# Patient Record
Sex: Female | Born: 1980
Health system: Southern US, Community
[De-identification: ages and names within clinical notes are randomized; demographics above are authoritative.]

## PROBLEM LIST (undated history)

## (undated) DIAGNOSIS — IMO0001 Reserved for inherently not codable concepts without codable children: Secondary | ICD-10-CM

## (undated) DIAGNOSIS — F32A Depression, unspecified: Secondary | ICD-10-CM

## (undated) DIAGNOSIS — K219 Gastro-esophageal reflux disease without esophagitis: Secondary | ICD-10-CM

## (undated) DIAGNOSIS — B019 Varicella without complication: Secondary | ICD-10-CM

## (undated) DIAGNOSIS — O24419 Gestational diabetes mellitus in pregnancy, unspecified control: Secondary | ICD-10-CM

## (undated) DIAGNOSIS — F329 Major depressive disorder, single episode, unspecified: Secondary | ICD-10-CM

## (undated) HISTORY — DX: Reserved for inherently not codable concepts without codable children: IMO0001

## (undated) HISTORY — PX: NO PAST SURGERIES: SHX2092

## (undated) HISTORY — DX: Gastro-esophageal reflux disease without esophagitis: K21.9

## (undated) HISTORY — DX: Varicella without complication: B01.9

## (undated) HISTORY — DX: Major depressive disorder, single episode, unspecified: F32.9

## (undated) HISTORY — PX: LASIK: SHX215

## (undated) HISTORY — DX: Depression, unspecified: F32.A

---

## 2011-03-12 LAB — HM PAP SMEAR

## 2011-04-29 ENCOUNTER — Inpatient Hospital Stay (INDEPENDENT_AMBULATORY_CARE_PROVIDER_SITE_OTHER)
Admission: RE | Admit: 2011-04-29 | Discharge: 2011-04-29 | Disposition: A | Discharge status: Home / Self Care | Payer: Self-pay | Source: Ambulatory Visit | Attending: Emergency Medicine | Admitting: Emergency Medicine

## 2011-04-29 DIAGNOSIS — J019 Acute sinusitis, unspecified: Secondary | ICD-10-CM

## 2011-04-29 DIAGNOSIS — J069 Acute upper respiratory infection, unspecified: Secondary | ICD-10-CM

## 2011-04-29 DIAGNOSIS — H6590 Unspecified nonsuppurative otitis media, unspecified ear: Secondary | ICD-10-CM

## 2011-05-10 ENCOUNTER — Ambulatory Visit: Payer: Self-pay | Admitting: Internal Medicine

## 2011-07-12 ENCOUNTER — Ambulatory Visit (INDEPENDENT_AMBULATORY_CARE_PROVIDER_SITE_OTHER)
Admission: RE | Admit: 2011-07-12 | Discharge: 2011-07-12 | Disposition: A | Discharge status: Home / Self Care | Payer: 59 | Source: Ambulatory Visit | Attending: Internal Medicine | Admitting: Internal Medicine

## 2011-07-12 ENCOUNTER — Encounter: Payer: Self-pay | Admitting: Internal Medicine

## 2011-07-12 ENCOUNTER — Ambulatory Visit (INDEPENDENT_AMBULATORY_CARE_PROVIDER_SITE_OTHER): Payer: 59 | Admitting: Internal Medicine

## 2011-07-12 VITALS — BP 130/80 | HR 72 | Temp 98.1°F | Resp 14 | Ht 64.0 in | Wt 194.0 lb

## 2011-07-12 DIAGNOSIS — M25571 Pain in right ankle and joints of right foot: Secondary | ICD-10-CM

## 2011-07-12 DIAGNOSIS — M25579 Pain in unspecified ankle and joints of unspecified foot: Secondary | ICD-10-CM

## 2011-07-12 DIAGNOSIS — E7800 Pure hypercholesterolemia, unspecified: Secondary | ICD-10-CM

## 2011-07-12 LAB — LIPID PANEL: Cholesterol: 207 mg/dL — ABNORMAL HIGH (ref 0–200)

## 2011-07-12 NOTE — Progress Notes (Signed)
Subjective:    Patient ID: Jasmine Ortega, female    DOB: 1981-05-05, 31 y.o.   MRN: [id number]  HPI  New to establish Hx of mild depression Increased stress CAD in paternal grandfather at age 62  Review of Systems  Constitutional: Negative for activity change, appetite change and fatigue.  HENT: Negative for ear pain, congestion, neck pain, postnasal drip and sinus pressure.   Eyes: Negative for redness and visual disturbance.  Respiratory: Negative for cough, shortness of breath and wheezing.   Gastrointestinal: Negative for abdominal pain and abdominal distention.  Genitourinary: Negative for dysuria, frequency and menstrual problem.  Musculoskeletal: Negative for myalgias, joint swelling and arthralgias.  Skin: Negative for rash and wound.  Neurological: Negative for dizziness, weakness and headaches.  Hematological: Negative for adenopathy. Does not bruise/bleed easily.  Psychiatric/Behavioral: Negative for sleep disturbance and decreased concentration.   Past Medical History  Diagnosis Date  . Depression   . Chicken pox   . GERD (gastroesophageal reflux disease)   . Birth control     History   Social History  . Marital Status: Single    Spouse Name: N/A    Number of Children: N/A  . Years of Education: N/A   Occupational History  . Not on file.   Social History Main Topics  . Smoking status: Never Smoker   . Smokeless tobacco: Not on file  . Alcohol Use: Yes  . Drug Use: No  . Sexually Active: Not on file   Other Topics Concern  . Not on file   Social History Narrative  . No narrative on file    No past surgical history on file.  Family History  Problem Relation Age of Onset  . Arthritis Father   . Hyperlipidemia Father   . Hypertension Father   . Arthritis Paternal Grandmother   . Heart disease Paternal Grandmother   . Breast cancer Mother   . Miscarriages / Stillbirths Maternal Grandfather   . Diabetes Paternal Grandfather     No Known  Allergies  No current outpatient prescriptions on file prior to visit.    BP 130/80  Pulse 72  Temp 98.1 F (36.7 C)  Resp 14  Ht 5\' 4"  (1.626 m)  Wt 194 lb (87.998 kg)  BMI 33.30 kg/m2       Objective:   Physical Exam  Nursing note and vitals reviewed. Constitutional: She is oriented to person, place, and time. She appears well-developed and well-nourished. No distress.  HENT:  Head: Normocephalic and atraumatic.  Right Ear: External ear normal.  Left Ear: External ear normal.  Nose: Nose normal.  Mouth/Throat: Oropharynx is clear and moist.  Eyes: Conjunctivae and EOM are normal. Pupils are equal, round, and reactive to light.  Neck: Normal range of motion. Neck supple. No JVD present. No tracheal deviation present. No thyromegaly present.  Cardiovascular: Normal rate, regular rhythm, normal heart sounds and intact distal pulses.   No murmur heard. Pulmonary/Chest: Effort normal and breath sounds normal. She has no wheezes. She exhibits no tenderness.  Abdominal: Soft. Bowel sounds are normal.  Musculoskeletal: Normal range of motion. She exhibits no edema and no tenderness.  Lymphadenopathy:    She has no cervical adenopathy.  Neurological: She is alert and oriented to person, place, and time. She has normal reflexes. No cranial nerve deficit.  Skin: Skin is warm and dry. She is not diaphoretic.  Psychiatric: She has a normal mood and affect. Her behavior is normal.    GYN is [employer]/textile [employer]ologist  Berneice Gandy at [employer] Has not had screening lipids       Assessment & Plan:   6 chronic right foot pain possibly a subacute right foot fracture her pulse and for x-rays and refer to foot orthopedist.  History of depression currently without symptoms of acute or chronic depression continue to monitor.  History of cardiovascular disease in her family monitor screening lipids today continue with her woman's wellness examinations on a yearly basis again internal medicine wellness  examinations at age 82

## 2011-07-12 NOTE — Patient Instructions (Signed)
The patient is instructed to continue all medications as prescribed. Schedule followup with check out clerk upon leaving the clinic  

## 2012-04-04 ENCOUNTER — Ambulatory Visit (INDEPENDENT_AMBULATORY_CARE_PROVIDER_SITE_OTHER): Payer: 59 | Admitting: Family

## 2012-04-04 ENCOUNTER — Encounter: Payer: Self-pay | Admitting: Family

## 2012-04-04 VITALS — BP 120/80 | Temp 98.3°F | Wt 183.0 lb

## 2012-04-04 DIAGNOSIS — L919 Hypertrophic disorder of the skin, unspecified: Secondary | ICD-10-CM

## 2012-04-04 DIAGNOSIS — B351 Tinea unguium: Secondary | ICD-10-CM

## 2012-04-04 DIAGNOSIS — L918 Other hypertrophic disorders of the skin: Secondary | ICD-10-CM

## 2012-04-04 LAB — BASIC METABOLIC PANEL
BUN: 15 mg/dL (ref 6–23)
Chloride: 104 mEq/L (ref 96–112)
Glucose, Bld: 85 mg/dL (ref 70–99)
Potassium: 4 mEq/L (ref 3.5–5.1)

## 2012-04-04 MED ORDER — CICLOPIROX 8 % EX SOLN: Freq: Every day | CUTANEOUS | Status: DC

## 2012-04-04 NOTE — Progress Notes (Signed)
Subjective:    Patient ID: Jasmine Ortega, female    DOB: 23-Aug-1980, 31 y.o.   MRN: [id number]  HPI 31 year old white female, nonsmoker, patient of Dr. Fabian Sharp is in today with complaints of a nail fungus to her left great toe has been present several years. She has tried several over-the-counter antifungal agents, Vicks vapor of, no polishes that had not been effective against treating her no fungus. She does have tenderness to touch of the nail. It is lifting from the nailbed.   Patient also has a skin tag and she will like to have removed under her left breast. It's become red and inflamed due to the location.     Review of Systems  Constitutional: Negative.   Respiratory: Negative.   Cardiovascular: Negative.   Gastrointestinal: Negative.   Musculoskeletal: Negative.   Skin: Negative.        Red irritated, skin tag under the left breast.  Nail fungus to the right great toe.  Neurological: Negative.   Psychiatric/Behavioral: Negative.    Past Medical History  Diagnosis Date  . Depression   . Chicken pox   . GERD (gastroesophageal reflux disease)   . Birth control     History   Social History  . Marital Status: Single    Spouse Name: N/A    Number of Children: N/A  . Years of Education: N/A   Occupational History  . Not on file.   Social History Main Topics  . Smoking status: Never Smoker   . Smokeless tobacco: Not on file  . Alcohol Use: Yes  . Drug Use: No  . Sexually Active: Not on file   Other Topics Concern  . Not on file   Social History Narrative  . No narrative on file    No past surgical history on file.  Family History  Problem Relation Age of Onset  . Arthritis Father   . Hyperlipidemia Father   . Hypertension Father   . Arthritis Paternal Grandmother   . Heart disease Paternal Grandmother   . Breast cancer Mother   . Miscarriages / Stillbirths Maternal Grandfather   . Diabetes Paternal Grandfather     No Known Allergies  Current  Outpatient Prescriptions on File Prior to Visit  Medication Sig Dispense Refill  . Multiple Vitamin (MULTIVITAMIN) tablet Take 1 tablet by mouth daily.      . Norgestimate-Ethinyl Estradiol Triphasic (ORTHO TRI-CYCLEN LO) 0.18/0.215/0.25 MG-25 MCG tablet Take 1 tablet by mouth daily.        BP 120/80  Temp 98.3 F (36.8 C) (Oral)  Wt 183 lb (83.008 kg)chart    Objective:   Physical Exam  Constitutional: She is oriented to person, place, and time. She appears well-developed and well-nourished.  Neck: Normal range of motion. Neck supple.  Cardiovascular: Normal rate, regular rhythm and normal heart sounds.   Pulmonary/Chest: Effort normal and breath sounds normal.  Abdominal: Soft. Bowel sounds are normal.  Musculoskeletal: Normal range of motion.  Neurological: She is alert and oriented to person, place, and time.  Skin: Skin is warm and dry.       Small red, irritated skin tag noted to the trunk under the left breast. No s/s of infection.   Left great toenail 60% removed from the nailbed. Tender to palpation. Fungus noted under the nail.     Psychiatric: She has a normal mood and affect.   Procedure: Skin tag removal, informed consent was obtained. Under sterile technique, 1/2 cc of lidocaine with epi  was used to anesthetize the skin tag under the left breast. #11 blade used to remove the skin tag. Cauterized with nitro-stick. Patient tolerated the procedure well.        Assessment & Plan:  Assessment: Onychomycosis, Skin tag  Plan: Penlac solution to the toe every night x7 days. Removal of alcohol after 7 days reapply. BMP sent. Will notify patient pending results. Call the office if symptoms worsen or persist. Recheck a schedule, appearing.

## 2012-04-04 NOTE — Patient Instructions (Addendum)
Ringworm, Nail  A fungal infection of the nail (tinea unguium/onychomycosis) is common. It is common as the visible part of the nail is composed of dead cells which have no blood supply to help prevent infection. It occurs because fungi are everywhere and will pick any opportunity to grow on any dead material.  Because nails are very slow growing they require up to 2 years of treatment with anti-fungal medications. The entire nail back to the base is infected. This includes approximately  of the nail which you cannot see.  If your caregiver has prescribed a medication by mouth, take it every day and as directed. No progress will be seen for at least 6 to 9 months. Do not be disappointed! Because fungi live on dead cells with little or no exposure to blood supply, medication delivery to the infection is slow; thus the cure is slow. It is also why you can observe no progress in the first 6 months. The nail becoming cured is the base of the nail, as it has the blood supply. Topical medication such as creams and ointments are usually not effective. Important in successful treatment of nail fungus is closely following the medication regimen that your doctor prescribes.  Sometimes you and your caregiver may elect to speed up this process by surgical removal of all the nails. Even this may still require 6 to 9 months of additional oral medications.  See your caregiver as directed. Remember there will be no visible improvement for at least 6 months. See your caregiver sooner if other signs of infection (redness and swelling) develop.  Document Released: 06/11/2000 Document Revised: 09/06/2011 Document Reviewed: 08/20/2008  [employer] Patient Information 2013 [employer].

## 2012-08-18 ENCOUNTER — Telehealth: Payer: Self-pay | Admitting: Family

## 2012-08-18 MED ORDER — CICLOPIROX 8 % EX SOLN: Freq: Every day | CUTANEOUS | Status: DC

## 2012-08-18 NOTE — Telephone Encounter (Signed)
Pt would like a refill of ciclopirox (PENLAC) 8 % solution. Her toenail is still not well. [facility] [address] [address] [facility]

## 2013-12-12 ENCOUNTER — Ambulatory Visit (INDEPENDENT_AMBULATORY_CARE_PROVIDER_SITE_OTHER): Payer: 59 | Admitting: Family Medicine

## 2013-12-12 ENCOUNTER — Encounter: Payer: Self-pay | Admitting: Family Medicine

## 2013-12-12 VITALS — BP 100/72 | Ht 64.0 in | Wt 185.0 lb

## 2013-12-12 DIAGNOSIS — R6882 Decreased libido: Secondary | ICD-10-CM

## 2013-12-12 DIAGNOSIS — K9041 Non-celiac gluten sensitivity: Secondary | ICD-10-CM

## 2013-12-12 DIAGNOSIS — R51 Headache: Secondary | ICD-10-CM

## 2013-12-12 DIAGNOSIS — L818 Other specified disorders of pigmentation: Secondary | ICD-10-CM

## 2013-12-12 DIAGNOSIS — L819 Disorder of pigmentation, unspecified: Secondary | ICD-10-CM

## 2013-12-12 DIAGNOSIS — Z803 Family history of malignant neoplasm of breast: Secondary | ICD-10-CM

## 2013-12-12 DIAGNOSIS — K900 Celiac disease: Secondary | ICD-10-CM

## 2013-12-12 DIAGNOSIS — Z0000 Encounter for general adult medical examination without abnormal findings: Secondary | ICD-10-CM

## 2013-12-12 LAB — POCT URINALYSIS DIPSTICK
Bilirubin, UA: NEGATIVE
GLUCOSE UA: NEGATIVE
Ketones, UA: NEGATIVE
Leukocytes, UA: NEGATIVE
NITRITE UA: NEGATIVE
PROTEIN UA: NEGATIVE
RBC UA: NEGATIVE
Spec Grav, UA: 1.01
UROBILINOGEN UA: NEGATIVE
pH, UA: 7

## 2013-12-12 LAB — COMPREHENSIVE METABOLIC PANEL
ALK PHOS: 76 U/L (ref 39–117)
ALT: 19 U/L (ref 0–35)
AST: 15 U/L (ref 0–37)
Albumin: 4.2 g/dL (ref 3.5–5.2)
BILIRUBIN TOTAL: 0.7 mg/dL (ref 0.2–1.2)
BUN: 18 mg/dL (ref 6–23)
CO2: 23 mEq/L (ref 19–32)
CREATININE: 0.82 mg/dL (ref 0.50–1.10)
Calcium: 9.4 mg/dL (ref 8.4–10.5)
Chloride: 98 mEq/L (ref 96–112)
Glucose, Bld: 77 mg/dL (ref 70–99)
Potassium: 4.1 mEq/L (ref 3.5–5.3)
SODIUM: 134 meq/L — AB (ref 135–145)
TOTAL PROTEIN: 7.3 g/dL (ref 6.0–8.3)

## 2013-12-12 LAB — CBC WITH DIFFERENTIAL/PLATELET
BASOS ABS: 0 10*3/uL (ref 0.0–0.1)
BASOS PCT: 0 % (ref 0–1)
EOS ABS: 0.2 10*3/uL (ref 0.0–0.7)
EOS PCT: 2 % (ref 0–5)
HCT: 37.8 % (ref 36.0–46.0)
Hemoglobin: 13.2 g/dL (ref 12.0–15.0)
LYMPHS ABS: 3.2 10*3/uL (ref 0.7–4.0)
Lymphocytes Relative: 33 % (ref 12–46)
MCH: 29.5 pg (ref 26.0–34.0)
MCHC: 34.9 g/dL (ref 30.0–36.0)
MCV: 84.4 fL (ref 78.0–100.0)
Monocytes Absolute: 0.7 10*3/uL (ref 0.1–1.0)
Monocytes Relative: 7 % (ref 3–12)
NEUTROS PCT: 58 % (ref 43–77)
Neutro Abs: 5.7 10*3/uL (ref 1.7–7.7)
PLATELETS: 306 10*3/uL (ref 150–400)
RBC: 4.48 MIL/uL (ref 3.87–5.11)
RDW: 13.5 % (ref 11.5–15.5)
WBC: 9.8 10*3/uL (ref 4.0–10.5)

## 2013-12-12 LAB — LIPID PANEL
CHOL/HDL RATIO: 3.1 ratio
CHOLESTEROL: 233 mg/dL — AB (ref 0–200)
HDL: 76 mg/dL (ref >39)
LDL CALC: 129 mg/dL — AB (ref 0–99)
Triglycerides: 138 mg/dL (ref <150)
VLDL: 28 mg/dL (ref 0–40)

## 2013-12-12 NOTE — Progress Notes (Signed)
Subjective:    Patient ID: Jasmine Ortega, female    DOB: 1981/03/29, 33 y.o.   MRN: [id number]  HPI She is here for a complete examination. She has several concerns. She notes that does have a gluten intolerance. She has cut back on gluten and her bloating and abdominal distress have greatly diminished. She is not interested in further testing however. She also complains of decreased libido over the last several years and notes that this occurred roughly her on the same time that she was switched on her birth control pills. Was apparently due to a slight change in her menstrual cycle. She also notes intermittent headaches that occur usually monthly alert in the frontal area. Air constant and did cause some nausea but no photophobia or phonophobia. There is also a previous history consistent with ADD. Apparently she did have testing which did show this. She notes decreased focus as well as inability to finish her tasks. She seems to now be able to handle this fairly well at this time is not necessarily interested in medication. She also has concerns over her weight. Family history significant for mother dying at age 66 of breast cancer.   Review of Systems  All other systems reviewed and are negative.      Objective:   Physical Exam BP 100/72  Ht _0  (1.626 m)  Wt 185 lb (83.915 kg)  BMI 31.74 kg/m2  General Appearance:    Alert, cooperative, no distress, appears stated age  Head:    Normocephalic, without obvious abnormality, atraumatic  Eyes:    PERRL, conjunctiva/corneas clear, EOM's intact, fundi    benign  Ears:    Normal TM's and external ear canals  Nose:   Nares normal, mucosa normal, no drainage or sinus   tenderness  Throat:   Lips, mucosa, and tongue normal; teeth and gums normal  Neck:   Supple, no lymphadenopathy;  thyroid:  no   enlargement/tenderness/nodules; no carotid   bruit or JVD  Back:    Spine nontender, no curvature, ROM normal, no CVA     tenderness  Lungs:      Clear to auscultation bilaterally without wheezes, rales or     ronchi; respirations unlabored  Chest Wall:    No tenderness or deformity   Heart:    Regular rate and rhythm, S1 and S2 normal, no murmur, rub   or gallop  Breast Exam:    Deferred to GYN  Abdomen:     Soft, non-tender, nondistended, normoactive bowel sounds,    no masses, no hepatosplenomegaly  Genitalia:    Deferred to GYN     Extremities:   No clubbing, cyanosis or edema  Pulses:   2+ and symmetric all extremities  Skin:   Skin color, texture, turgor normal, no rashes or lesions.tattoos present   Lymph nodes:   Cervical, supraclavicular, and axillary nodes normal  Neurologic:   CNII-XII intact, normal strength, sensation and gait; reflexes 2+ and symmetric throughout          Psych:   Normal mood, affect, hygiene and grooming.          Assessment & Plan:  Routine general medical examination at a health care facility - Plan: Urinalysis Dipstick, CBC with Differential, Comprehensive metabolic panel, Lipid panel  Gluten intolerance  Family history of breast cancer in mother - Plan: CANCELED: BRCAvantage, Comprehensive, CANCELED: BRCAvantage, Comprehensive  Tattoos  Headache(784.0)  Low libido Discussed the gluten intolerance with her. She is comfortable not pursuing  this. Also discussed this in regard to helping with weight. Recommend that she try to get her dress size down to a 10 or 12. Discussed the headache. Encouraged her to use ibuprofen more aggressively and early in the course and see if this will help. Recommend she discuss the libido with her gynecologist and potentially consider being switched back to her previous birth control pill. If we pursue the ADD diagnoses, would like to get records concerning this. BRCA was not ordered.

## 2013-12-12 NOTE — Patient Instructions (Signed)
Take 800 mg of ibuprofen at the first sign of a headache and pay attention to see if it truly is menstrual related

## 2013-12-13 ENCOUNTER — Encounter: Payer: Self-pay | Admitting: Family Medicine

## 2014-06-14 ENCOUNTER — Encounter: Payer: Self-pay | Admitting: Family Medicine

## 2014-06-14 ENCOUNTER — Telehealth: Payer: Self-pay | Admitting: Internal Medicine

## 2014-06-14 NOTE — Telephone Encounter (Signed)
I have not treated her for this. See if you can set up an appointment for the first of the week

## 2014-06-14 NOTE — Telephone Encounter (Signed)
Pt called stating that she has sent a mychart message to you but it has not come through yet and pt can not get in with her gyneco until late Monday afternoon and wants to know if you will send a rx in. She is having BV infection with itchying clear discharge that leaves a brown stain, fishy odor and has had it for the last few days and getting worse and doesn't really want to wait over the weekend. Can you send in an rx and rx for diflucan to [facility] [place]

## 2014-06-14 NOTE — Telephone Encounter (Signed)
Pt notified that she would have to been seen for this first since Dr. Redmond School has not seen you for this. Pt will stick with gynec appt Monday afternoon.

## 2014-07-03 ENCOUNTER — Ambulatory Visit (INDEPENDENT_AMBULATORY_CARE_PROVIDER_SITE_OTHER): Payer: 59 | Admitting: Medical

## 2014-07-03 ENCOUNTER — Encounter: Payer: Self-pay | Admitting: Medical

## 2014-07-03 VITALS — BP 92/70 | HR 69 | Temp 98.2°F | Wt 194.0 lb

## 2014-07-03 DIAGNOSIS — M79671 Pain in right foot: Secondary | ICD-10-CM

## 2014-07-03 NOTE — Patient Instructions (Signed)
Encounter Diagnosis  Name Primary?  . Heel pain, right Yes   Etiology likely heel spur, possibly plantar fascitis, but not to the extent you had in the past  Recommendations:  Begin OTC heel cups in both shoes  Try and stay off the heel when possible  Use exercise that doesn't impact the heel as much for now  Use OTC Ibuprofen 2-4 tablets every 8 hours for pain and inflammation  Use ice water bath daily for the next several days  Wear supportive shoes  Avoid barefoot, avoid shoes with no arch support or high heels, etc.   In the event some of this is related to plantar fascia, you can also use towel stretch at ball of foot and tennis ball rolling massage in the mornings  Consider 90 degree plantar fascia splint at bedtime  If not improving or seeing some improvement in the next 7-10 days, we can consider xray or other therapy.

## 2014-07-03 NOTE — Progress Notes (Signed)
Subjective: Chief Complaint  Patient presents with  . right heel pain   Here today for c/o right heel pain x few weeks.  2-3 years ago was diagnosed with plantar fascitis.   It eventually resolved.  This is a different kind of pain.  In the past with plantar fascitis could do elliptical at gym with no c/o . Now has a lot of pain with exercise.  Hurts to walk in general.   Hurts bottom center of right foot.  Gets stabbing pains.  Gets left hip and knee pain at end of the day walking funny.  Has tried Ibuprofen OTC, stretching, sleeping in the plantar fascitis twice.  Took 2 months off from running.  Using elliptical primary for exercise.  Has gained weight in recent months that may be aggravating the foot pain.   No recent fall, trauma, or injury.  No fever, no redness, no swelling.  No other aggravating or relieving factors. No other complaint.  Objective: BP 92/70 mmHg  Pulse 69  Temp(Src) 98.2 F (36.8 C) (Oral)  Wt 194 lb (87.998 kg)  Gen: wd, wn, nad Skin: unremarkable, no erythema, no ecchymosis, no wounds MSK: mild tenderness over right lateral heel, somewhat reduced arches bilat, otherwise nontender feet, ankles, achilles, normal ankle and toe ROM, no pain otherwise, rest of LE without tenderness, no deformity ,normal ROM Ext: no edema Feet neurovascularly intact    Assessment: Encounter Diagnosis  Name Primary?  . Heel pain, right Yes    Plan: Likely bone spur as etiology, but can't rule out some plantar fascitis.   Reviewed 2013 right heel xray showing a calcaneal spur.     Patient Instructions   Encounter Diagnosis  Name Primary?  . Heel pain, right Yes   Etiology likely heel spur, possibly plantar fascitis, but not to the extent you had in the past  Recommendations:  Begin OTC heel cups in both shoes  Try and stay off the heel when possible  Use exercise that doesn't impact the heel as much for now  Use OTC Ibuprofen 2-4 tablets every 8 hours for pain and  inflammation  Use ice water bath daily for the next several days  Wear supportive shoes  Avoid barefoot, avoid shoes with no arch support or high heels, etc.   In the event some of this is related to plantar fascia, you can also use towel stretch at ball of foot and tennis ball rolling massage in the mornings  Consider 90 degree plantar fascia splint at bedtime  If not improving or seeing some improvement in the next 7-10 days, we can consider xray or other therapy.

## 2014-07-28 ENCOUNTER — Encounter: Payer: Self-pay | Admitting: Medical

## 2014-08-07 ENCOUNTER — Encounter: Payer: Self-pay | Admitting: Sports Medicine

## 2014-08-07 ENCOUNTER — Ambulatory Visit (INDEPENDENT_AMBULATORY_CARE_PROVIDER_SITE_OTHER): Payer: 59 | Admitting: Sports Medicine

## 2014-08-07 VITALS — BP 120/73 | Ht 64.0 in | Wt 188.0 lb

## 2014-08-07 DIAGNOSIS — M722 Plantar fascial fibromatosis: Secondary | ICD-10-CM

## 2014-08-08 NOTE — Progress Notes (Signed)
   Subjective:    Patient ID: Jasmine Ortega, female    DOB: [birth year], 34 y.o.   MRN: [id number]  HPI chief complaint: Right heel pain  Very pleasant 34 year old female comes in today complaining of 3 months of right heel pain. No trauma. She has a history of plantar fasciitis which was treated conservatively 2 years ago. She was seen at [facility] and told that she had a heel spur in addition to plantar fasciitis. She was treated with physical therapy, meloxicam (which she had an allergic reaction to), and heel cups. Her pain eventually resolved after a year of symptoms. The pain at that time was primarily along the arch of her foot and today's pain is a little different in that it is primarily in the heel. It is worse with first step but is present with prolonged standing and walking as well. She has tried various shoes but nothing has seemed to help. No swelling. Some radiating discomfort up the back of her heel into her Achilles. No numbness and tingling. No prior foot or ankle surgery. Past medical history reviewed Medications reviewed Allergies reviewed. 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 No acute distress. Awake alert and oriented 3. Vital signs reviewed.  Right heel: There is tenderness to palpation at the calcaneal insertion of the plantar fascia but it is not marked. No pain with calcaneal squeeze. No soft tissue swelling. No tenderness to palpation along the distal arch. Negative Tinel's at the tarsal tunnel. No tenderness to palpation along the distal Achilles tendon. Good Achilles flexibility. Pes planus with standing. Pronation with walking. Walking without a limp.  MSK ultrasound of the right foot was performed. Plantar fascia was imaged and measured in the long axis. It is 0.50 cm and has diffuse hypoechoic change throughout the  proximal portion. Findings consistent with plantar fasciitis. Achilles tendon well-visualized and within normal limits.       Assessment & Plan:  Right heel plantar fasciitis  Patient is already doing plantar fascial stretching. I've encouraged her to continue with this daily. I also want her to start daily ice immersion of her right heel. We will try an arch strap. She did not bring in any type of supportive shoe today so she will return to the office in one week with a pair of shoes that we can fit with green sports insoles and scaphoid pads. She may do this at her convenience. She will follow-up with me in 6 weeks. We will consider custom orthotics at that visit. Continue with activity as tolerated. We also discussed the fact that weight loss may also help with this condition. Call with questions or concerns in the interim.

## 2014-08-14 ENCOUNTER — Ambulatory Visit (INDEPENDENT_AMBULATORY_CARE_PROVIDER_SITE_OTHER): Payer: 59 | Admitting: Sports Medicine

## 2014-08-14 ENCOUNTER — Encounter: Payer: Self-pay | Admitting: Sports Medicine

## 2014-08-14 VITALS — Ht 64.0 in | Wt 188.0 lb

## 2014-08-14 DIAGNOSIS — M722 Plantar fascial fibromatosis: Secondary | ICD-10-CM | POA: Insufficient documentation

## 2014-08-14 NOTE — Progress Notes (Signed)
Patient ID: Jasmine Ortega, female   DOB: April 01, 1981, 34 y.o.   MRN: [id number]  Patient comes in to be fitted with green sports insoles and scaphoid pads. She is finding her arch straps to be comfortable. She is performing her plantar fascial stretches. We will go ahead and fit her with her insoles and have her return to the office in 6 weeks for custom orthotics.  Please see office note from 08/07/2014 for further details.

## 2014-09-18 ENCOUNTER — Ambulatory Visit (INDEPENDENT_AMBULATORY_CARE_PROVIDER_SITE_OTHER): Payer: 59 | Admitting: Sports Medicine

## 2014-09-18 ENCOUNTER — Encounter: Payer: Self-pay | Admitting: Sports Medicine

## 2014-09-18 VITALS — BP 114/75 | HR 85 | Ht 64.0 in | Wt 188.0 lb

## 2014-09-18 DIAGNOSIS — M722 Plantar fascial fibromatosis: Secondary | ICD-10-CM | POA: Diagnosis not present

## 2014-09-18 DIAGNOSIS — M216X1 Other acquired deformities of right foot: Secondary | ICD-10-CM | POA: Insufficient documentation

## 2014-09-18 NOTE — Progress Notes (Signed)
   Subjective:    Patient ID: Jasmine Ortega, female DOB: Feb 26, 1981, 34 y.o. MRN: [id number]  HPI chief complaint: Right heel pain  Very pleasant 33 year old female comes in today for follow up of plantar fascitis, R sided. She states that she is now 95% improved. She has been doing stair stretches and wearing arch strap. She has been occasionally icing and rolling out her fascia. Her husband gives her frequent foot messages. She has been wearing her green sports insert with arch support only when she goes to the gym but feels that her arch hurts worse with this insert than without it. Of note, she does occasionally get pain and a popping sensation on the dorsal side of her foot just proximal to the head of her 2 metatarsal. Denies any numbness or tingling in her toes.  Past medical history reviewed Medications reviewed Allergies reviewed. 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 No acute distress. Awake alert and oriented 3. Vital signs reviewed.  Right heel: There is no tenderness to palpation at the calcaneal insertion of the plantar fascia. No pain with calcaneal squeeze. No soft tissue swelling. No tenderness to palpation along the distal arch. No tenderness to palpation along the distal Achilles tendon. Good Achilles flexibility. Pes planus with standing. Pronation with walking. Walking without a limp. Does have splaying of 2nd and 3rd toes on R foot. TTP at base of 2nd metatarsal. Morton's callus present.     Assessment & Plan:  Right heel plantar fasciitis - much improved - continue to wear arch support strap - continue with stairs exercises (heel lifts and drops) - continue with ICE and massage PRN - as symptoms felt worse with green insoles with arch support padding we will not fit her with custom orthotics  - return  as needed  Dropping of transverse arch, R foot - given catalogue for metatarsal pad if desires or if pain/symptoms worsen - return as needed, if symptoms worsen  Original note by Janan Halter, PGY2 [facility]     Patient was seen with the above-named resident. I agree with the above plan of care.

## 2014-09-18 NOTE — Progress Notes (Deleted)
   Subjective:    Patient ID: Jasmine Ortega, female    DOB: [birth year], 34 y.o.   MRN: [id number]  HPI chief complaint: Right heel pain  Very pleasant 34 year old female comes in today for follow up of plantar fascitis, R sided.  She states that she is now 95% improved.  She has been doing stair stretches and wearing arch strap.  She has been occasionally icing and rolling out her fascia.  Her husband gives her frequent foot messages.  She has been wearing her green sports insert with arch support only when she goes to the gym but feels that her arch hurts worse with this insert than without it. Of note, she does occasionally get pain and a popping sensation on the dorsal side of her foot just proximal to the head of her 2 metatarsal.  Denies any numbness or tingling in her toes.  Past medical history reviewed Medications reviewed Allergies reviewed. 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 No acute distress. Awake alert and oriented 3. Vital signs reviewed.  Right heel: There is no tenderness to palpation at the calcaneal insertion of the plantar fascia. No pain with calcaneal squeeze. No soft tissue swelling. No tenderness to palpation along the distal arch.  No tenderness to palpation along the distal Achilles tendon. Good Achilles flexibility. Pes planus with standing. Pronation with walking. Walking without a limp.  Does have splaying of 2nd and 3rd toes on R foot.  TTP at base of 2nd metatarsal.  Morton's callus present.     Assessment & Plan:  Right heel plantar fasciitis - much improved - continue to wear arch support strap - continue with stairs exercises (heel lifts and drops) - continue with ICE and massage PRN - as symptoms felt worse with green insoles with arch support padding we will not fit her with custom orthotics  - return as  needed  Dropping of transverse arch, R foot - given catalogue for metatarsal pad if desires or if pain/symptoms worsen - return as needed, if symptoms worsen  Original note by Janan Halter, PGY2 [place]

## 2015-01-24 ENCOUNTER — Encounter: Payer: Self-pay | Admitting: Family Medicine

## 2015-02-05 ENCOUNTER — Ambulatory Visit (INDEPENDENT_AMBULATORY_CARE_PROVIDER_SITE_OTHER): Payer: 59 | Admitting: Family Medicine

## 2015-02-05 ENCOUNTER — Encounter: Payer: Self-pay | Admitting: Family Medicine

## 2015-02-05 DIAGNOSIS — F900 Attention-deficit hyperactivity disorder, predominantly inattentive type: Secondary | ICD-10-CM | POA: Diagnosis not present

## 2015-02-05 MED ORDER — AMPHETAMINE-DEXTROAMPHETAMINE 10 MG PO TABS: 10.0000 mg | ORAL_TABLET | Freq: Two times a day (BID) | ORAL | Status: DC

## 2015-02-05 NOTE — Patient Instructions (Signed)
I need to know if it works, how long it works and if you have any side effects.

## 2015-02-05 NOTE — Progress Notes (Signed)
   Subjective:    Patient ID: Jasmine Ortega, female    DOB: 11-Jun-1981, 34 y.o.   MRN: [id number]  HPI She is here for consultation concerning ADD. Apparently she was diagnosed with ADD when she was in college. She was placed on Adderall which worked quite nicely. She last used this in 2010. She now is under a lot of stress due to work and finances and finds that she is anxious as well as having difficulty with staying focused. She does not tend to finish tasks. She also notes that she is demonstrating inattention while at work and that she is not paying attention to what going on and seems to have her mind wandering.   Review of Systems     Objective:   Physical Exam Alert and in no distress. Adult ADHD scale of 33 noted.       Assessment & Plan:  ADD (attention deficit hyperactivity disorder, inattentive type) - Plan: amphetamine-dextroamphetamine (ADDERALL) 10 MG tablet  She will sign a release form to get the information from her college. I think she indeed does have ADD inattention type. I will give her Adderall 10 mg. She is to let me know if that works, how long it works and if she has any troubles with this. He will call back on my chart for this.

## 2015-02-27 ENCOUNTER — Telehealth: Payer: Self-pay | Admitting: Family Medicine

## 2015-02-27 LAB — HM PAP SMEAR: HM Pap smear: NORMAL

## 2015-02-27 NOTE — Telephone Encounter (Signed)
Left message for pt to call needs an appt per jcl. Pt has paper work for this appt.

## 2015-03-05 ENCOUNTER — Encounter: Payer: Self-pay | Admitting: Family Medicine

## 2015-03-05 ENCOUNTER — Ambulatory Visit (INDEPENDENT_AMBULATORY_CARE_PROVIDER_SITE_OTHER): Payer: 59 | Admitting: Family Medicine

## 2015-03-05 VITALS — BP 120/80 | HR 90 | Wt 207.0 lb

## 2015-03-05 DIAGNOSIS — F900 Attention-deficit hyperactivity disorder, predominantly inattentive type: Secondary | ICD-10-CM | POA: Insufficient documentation

## 2015-03-05 MED ORDER — AMPHETAMINE-DEXTROAMPHETAMINE 10 MG PO TABS: 10.0000 mg | ORAL_TABLET | Freq: Two times a day (BID) | ORAL | Status: DC

## 2015-03-05 NOTE — Progress Notes (Signed)
   Subjective:    Patient ID: Jasmine Ortega, female    DOB: 1980-08-22, 34 y.o.   MRN: [id number]  HPI She is here for consult concerning ADD. She did score 33 on her ADHD questionnaire. Review of her past history shows no evidence of getting of this in college which she was quite rise that. She states that she did indeed get this while she was in college. Presently she is using Adderall 10 mg it lasts 5 or 6 hours. She has no difficulty when the medicine wears off.   Review of Systems     Objective:   Physical Exam Alert and in no distress otherwise not examined       Assessment & Plan:  ADD (attention deficit hyperactivity disorder, inattentive type) - Plan: amphetamine-dextroamphetamine (ADDERALL) 10 MG tablet, amphetamine-dextroamphetamine (ADDERALL) 10 MG tablet, amphetamine-dextroamphetamine (ADDERALL) 10 MG tablet After discussion with her, I will give her twice a day dosing of this. This should last her 10 hours. She will start taking the medicine slightly earlier in the day to not interfere with her sleep at night. She will call if she has problems.

## 2015-04-17 ENCOUNTER — Encounter: Payer: Self-pay | Admitting: Sports Medicine

## 2015-05-01 ENCOUNTER — Other Ambulatory Visit (HOSPITAL_COMMUNITY): Payer: Self-pay | Admitting: Obstetrics & Gynecology

## 2015-05-01 DIAGNOSIS — Z3149 Encounter for other procreative investigation and testing: Secondary | ICD-10-CM

## 2015-05-07 ENCOUNTER — Ambulatory Visit (HOSPITAL_COMMUNITY)
Admission: RE | Admit: 2015-05-07 | Discharge: 2015-05-07 | Disposition: A | Discharge status: Home / Self Care | Payer: 59 | Source: Ambulatory Visit | Attending: Obstetrics & Gynecology | Admitting: Obstetrics & Gynecology

## 2015-05-07 DIAGNOSIS — N979 Female infertility, unspecified: Secondary | ICD-10-CM | POA: Insufficient documentation

## 2015-05-07 DIAGNOSIS — Z3149 Encounter for other procreative investigation and testing: Secondary | ICD-10-CM

## 2015-05-07 MED ORDER — IOHEXOL 300 MG/ML  SOLN
30.0000 mL | Freq: Once | INTRAMUSCULAR | Status: DC | PRN
  Administered 2015-05-07: 30 mL
  Filled 2015-05-07: qty 30

## 2015-07-01 MED FILL — CLOMIPHENE CITRATE 50 MG TA: 50 | 5 days supply | Qty: 15 | Fill #0

## 2015-07-18 DIAGNOSIS — Z3149 Encounter for other procreative investigation and testing: Secondary | ICD-10-CM | POA: Diagnosis not present

## 2015-07-28 MED FILL — LETROZOLE 2.5 MG TABLET: 2.5 | 5 days supply | Qty: 10 | Fill #0

## 2015-08-15 DIAGNOSIS — Z3149 Encounter for other procreative investigation and testing: Secondary | ICD-10-CM | POA: Diagnosis not present

## 2015-08-25 MED FILL — LETROZOLE 2.5 MG TABLET: 2.5 | 5 days supply | Qty: 15 | Fill #0

## 2015-09-11 DIAGNOSIS — Z3149 Encounter for other procreative investigation and testing: Secondary | ICD-10-CM | POA: Diagnosis not present

## 2015-09-19 MED FILL — LETROZOLE 2.5 MG TABLET: 2.5 | 5 days supply | Qty: 15 | Fill #0

## 2015-10-09 DIAGNOSIS — Z3149 Encounter for other procreative investigation and testing: Secondary | ICD-10-CM | POA: Diagnosis not present

## 2015-10-17 MED FILL — LETROZOLE 2.5 MG TABLET: 2.5 | 5 days supply | Qty: 15 | Fill #0

## 2015-11-06 DIAGNOSIS — Z3149 Encounter for other procreative investigation and testing: Secondary | ICD-10-CM | POA: Diagnosis not present

## 2015-11-14 MED FILL — metFORMIN HCL 500 MG TABS: 500 | 30 days supply | Qty: 60 | Fill #0

## 2015-11-17 MED FILL — LETROZOLE 2.5 MG TABLET: 2.5 | 7 days supply | Qty: 15 | Fill #0

## 2015-11-27 ENCOUNTER — Encounter: Payer: Self-pay | Admitting: Family Medicine

## 2015-12-04 DIAGNOSIS — Z3149 Encounter for other procreative investigation and testing: Secondary | ICD-10-CM | POA: Diagnosis not present

## 2015-12-11 MED FILL — LETROZOLE 2.5 MG TABLET: 2.5 | 5 days supply | Qty: 15 | Fill #0

## 2015-12-18 MED FILL — metFORMIN HCL 500 MG TABS: 500 | 30 days supply | Qty: 60 | Fill #1

## 2015-12-24 DIAGNOSIS — Z3161 Procreative counseling and advice using natural family planning: Secondary | ICD-10-CM | POA: Diagnosis not present

## 2015-12-24 DIAGNOSIS — Z319 Encounter for procreative management, unspecified: Secondary | ICD-10-CM | POA: Diagnosis not present

## 2015-12-24 DIAGNOSIS — E282 Polycystic ovarian syndrome: Secondary | ICD-10-CM | POA: Diagnosis not present

## 2015-12-24 DIAGNOSIS — N978 Female infertility of other origin: Secondary | ICD-10-CM | POA: Diagnosis not present

## 2015-12-24 MED FILL — OVIDREL 250 MCG/0.5 ML SYRG: 250 | 1 days supply | Qty: 1 | Fill #0

## 2015-12-25 DIAGNOSIS — E282 Polycystic ovarian syndrome: Secondary | ICD-10-CM | POA: Diagnosis not present

## 2015-12-25 DIAGNOSIS — Z3189 Encounter for other procreative management: Secondary | ICD-10-CM | POA: Diagnosis not present

## 2015-12-25 DIAGNOSIS — N978 Female infertility of other origin: Secondary | ICD-10-CM | POA: Diagnosis not present

## 2015-12-26 DIAGNOSIS — Z319 Encounter for procreative management, unspecified: Secondary | ICD-10-CM | POA: Diagnosis not present

## 2015-12-31 DIAGNOSIS — Z3149 Encounter for other procreative investigation and testing: Secondary | ICD-10-CM | POA: Diagnosis not present

## 2016-01-06 MED FILL — LETROZOLE 2.5 MG TABLET: 2.5 | 3 days supply | Qty: 15 | Fill #0

## 2016-01-06 MED FILL — metFORMIN HCL 1000 MG TABS: 1000 | 30 days supply | Qty: 60 | Fill #0

## 2016-01-07 ENCOUNTER — Ambulatory Visit (INDEPENDENT_AMBULATORY_CARE_PROVIDER_SITE_OTHER): Payer: 59 | Admitting: Family Medicine

## 2016-01-07 ENCOUNTER — Encounter: Payer: Self-pay | Admitting: Family Medicine

## 2016-01-07 VITALS — BP 114/72 | HR 93 | Ht 65.0 in | Wt 210.0 lb

## 2016-01-07 DIAGNOSIS — M545 Low back pain, unspecified: Secondary | ICD-10-CM

## 2016-01-07 DIAGNOSIS — Z23 Encounter for immunization: Secondary | ICD-10-CM | POA: Diagnosis not present

## 2016-01-07 DIAGNOSIS — E669 Obesity, unspecified: Secondary | ICD-10-CM

## 2016-01-07 DIAGNOSIS — E282 Polycystic ovarian syndrome: Secondary | ICD-10-CM

## 2016-01-07 DIAGNOSIS — E785 Hyperlipidemia, unspecified: Secondary | ICD-10-CM | POA: Diagnosis not present

## 2016-01-07 DIAGNOSIS — J309 Allergic rhinitis, unspecified: Secondary | ICD-10-CM

## 2016-01-07 DIAGNOSIS — Z0000 Encounter for general adult medical examination without abnormal findings: Secondary | ICD-10-CM

## 2016-01-07 DIAGNOSIS — F900 Attention-deficit hyperactivity disorder, predominantly inattentive type: Secondary | ICD-10-CM | POA: Diagnosis not present

## 2016-01-07 DIAGNOSIS — G8929 Other chronic pain: Secondary | ICD-10-CM | POA: Diagnosis not present

## 2016-01-07 DIAGNOSIS — K219 Gastro-esophageal reflux disease without esophagitis: Secondary | ICD-10-CM | POA: Diagnosis not present

## 2016-01-07 LAB — COMPREHENSIVE METABOLIC PANEL
ALBUMIN: 4.2 g/dL (ref 3.6–5.1)
ALK PHOS: 64 U/L (ref 33–115)
ALT: 23 U/L (ref 6–29)
AST: 17 U/L (ref 10–30)
BILIRUBIN TOTAL: 0.8 mg/dL (ref 0.2–1.2)
BUN: 13 mg/dL (ref 7–25)
CALCIUM: 8.8 mg/dL (ref 8.6–10.2)
CO2: 26 mmol/L (ref 20–31)
CREATININE: 0.74 mg/dL (ref 0.50–1.10)
Chloride: 103 mmol/L (ref 98–110)
Glucose, Bld: 84 mg/dL (ref 65–99)
Potassium: 4.2 mmol/L (ref 3.5–5.3)
Sodium: 138 mmol/L (ref 135–146)
Total Protein: 6.7 g/dL (ref 6.1–8.1)

## 2016-01-07 LAB — CBC WITH DIFFERENTIAL/PLATELET
BASOS ABS: 0 {cells}/uL (ref 0–200)
Basophils Relative: 0 %
EOS PCT: 3 %
Eosinophils Absolute: 222 cells/uL (ref 15–500)
HCT: 39 % (ref 35.0–45.0)
HEMOGLOBIN: 13.2 g/dL (ref 11.7–15.5)
LYMPHS PCT: 34 %
Lymphs Abs: 2516 cells/uL (ref 850–3900)
MCH: 29.3 pg (ref 27.0–33.0)
MCHC: 33.8 g/dL (ref 32.0–36.0)
MCV: 86.7 fL (ref 80.0–100.0)
MONOS PCT: 8 %
MPV: 10.1 fL (ref 7.5–12.5)
Monocytes Absolute: 592 cells/uL (ref 200–950)
NEUTROS PCT: 55 %
Neutro Abs: 4070 cells/uL (ref 1500–7800)
PLATELETS: 250 10*3/uL (ref 140–400)
RBC: 4.5 MIL/uL (ref 3.80–5.10)
RDW: 13.6 % (ref 11.0–15.0)
WBC: 7.4 10*3/uL (ref 4.0–10.5)

## 2016-01-07 LAB — LIPID PANEL
CHOLESTEROL: 204 mg/dL — AB (ref 125–200)
HDL: 65 mg/dL (ref >=46)
LDL Cholesterol: 115 mg/dL (ref <130)
Total CHOL/HDL Ratio: 3.1 Ratio (ref <=5.0)
Triglycerides: 118 mg/dL (ref <150)
VLDL: 24 mg/dL (ref <30)

## 2016-01-07 MED FILL — OVIDREL 250 MCG/0.5 ML SYRG: 250 | 28 days supply | Qty: 1 | Fill #0

## 2016-01-07 NOTE — Progress Notes (Signed)
Subjective:    Patient ID: Jasmine Ortega, female    DOB: 11/22/1980, 35 y.o.   MRN: [id number]  HPI She is here for complete examination. 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 She also has been having some right lower quadrant/inguinal discomfort. She has been evaluated by GYN with ultrasound and apparently nothing has been found definitively. Inguinal hernia was mentioned to her. At this point is more of a nuisance and truly causing her profound amount of pain. She has underlying ADD but presently is on no medication. She and her husband are trying to get pregnant and she would like to hold off on any further medication management. She also has PC OS and presently is on metformin. She does have underlying allergies and uses Claritin on an as-needed basis. Also has a number difficulty with reflux disease and does use a PPI very sparingly. She has had some weight gain over the last year relating mainly to hormonal manipulations trying to get pregnant.Review of the record also indicates slightly elevated this. Family and social history as well as health maintenance and immunizations were reviewed.   Review of Systems     Objective:   Physical Exam BP 114/72 mmHg  Pulse 93  Ht 5\' 5"  (1.651 m)  Wt 210 lb (95.255 kg)  BMI 34.95 kg/m2  General Appearance:    Alert, cooperative, no distress, appears stated age  Head:    Normocephalic, without obvious abnormality, atraumatic  Eyes:    PERRL, conjunctiva/corneas clear, EOM's intact, fundi    benign  Ears:    Normal TM's and external ear canals  Nose:   Nares normal, mucosa normal, no drainage or sinus   tenderness  Throat:   Lips, mucosa, and tongue normal; teeth and gums normal  Neck:   Supple, no lymphadenopathy;  thyroid:  no   enlargement/tenderness/nodules; no carotid   bruit or JVD  Back:    Spine nontender, no curvature, ROM normal, no CVA      tenderness  Lungs:     Clear to auscultation bilaterally without wheezes, rales or     ronchi; respirations unlabored  Chest Wall:    No tenderness or deformity   Heart:    Regular rate and rhythm, S1 and S2 normal, no murmur, rub   or gallop  Breast Exam:    Deferred to GYN  Abdomen:     Soft, non-tender, nondistended, normoactive bowel sounds,    no masses, no hepatosplenomegaly  Genitalia:    Deferred to GYN     Extremities:   No clubbing, cyanosis or edema  Pulses:   2+ and symmetric all extremities  Skin:   Skin color, texture, turgor normal, no rashes or lesions  Lymph nodes:   Cervical, supraclavicular, and axillary nodes normal  Neurologic:   CNII-XII intact, normal strength, sensation and gait; reflexes 2+ and symmetric throughout          Psych:   Normal mood, affect, hygiene and grooming.         Assessment & Plan:  Routine general medical examination at a health care facility - Plan: CBC with Differential/Platelet, Lipid panel, Comprehensive metabolic panel  ADD (attention deficit hyperactivity disorder, inattentive type)  PCOS (polycystic ovarian syndrome) - Plan: CBC with Differential/Platelet, Lipid panel  Need for prophylactic vaccination with combined diphtheria-tetanus-pertussis (DTP) vaccine - Plan: Tdap vaccine greater than or equal to 7yo IM  Chronic low back pain  Allergic rhinitis, unspecified allergic rhinitis type  Gastroesophageal reflux disease without esophagitis  Hyperlipidemia - Plan: Lipid panel  Obesity - Plan: CBC with Differential/Platelet, Lipid panel, Comprehensive metabolic panel She will continue on her present medication regimen. Discussed the possibility of placing her on a Terra but since she is considering getting pregnant, we will hold off on that. Recommend she discuss her low back pain with her trainer to work on proper posturing and back care. She will continue to treat her allergies and reflux as needed. Discussed the pain and at  this point we will not pursue this any further. She is comfortable with that. No particular intervention concerning the weight other than cutting back on carbohydrates.

## 2016-01-15 DIAGNOSIS — Z319 Encounter for procreative management, unspecified: Secondary | ICD-10-CM | POA: Diagnosis not present

## 2016-01-19 DIAGNOSIS — Z3189 Encounter for other procreative management: Secondary | ICD-10-CM | POA: Diagnosis not present

## 2016-01-21 DIAGNOSIS — H5213 Myopia, bilateral: Secondary | ICD-10-CM | POA: Diagnosis not present

## 2016-02-02 DIAGNOSIS — Z3200 Encounter for pregnancy test, result unknown: Secondary | ICD-10-CM | POA: Diagnosis not present

## 2016-02-03 MED FILL — metFORMIN HCL 1000 MG TABS: 1000 | 30 days supply | Qty: 60 | Fill #1

## 2016-02-16 DIAGNOSIS — Z3200 Encounter for pregnancy test, result unknown: Secondary | ICD-10-CM | POA: Diagnosis not present

## 2016-02-16 MED FILL — FOLIC ACID 1 MG TABLET: 1 | 30 days supply | Qty: 30 | Fill #0

## 2016-02-25 DIAGNOSIS — Z124 Encounter for screening for malignant neoplasm of cervix: Secondary | ICD-10-CM | POA: Diagnosis not present

## 2016-02-25 DIAGNOSIS — Z3481 Encounter for supervision of other normal pregnancy, first trimester: Secondary | ICD-10-CM | POA: Diagnosis not present

## 2016-02-25 DIAGNOSIS — Z6835 Body mass index (BMI) 35.0-35.9, adult: Secondary | ICD-10-CM | POA: Diagnosis not present

## 2016-02-25 DIAGNOSIS — O30041 Twin pregnancy, dichorionic/diamniotic, first trimester: Secondary | ICD-10-CM | POA: Diagnosis not present

## 2016-02-25 DIAGNOSIS — Z01419 Encounter for gynecological examination (general) (routine) without abnormal findings: Secondary | ICD-10-CM | POA: Diagnosis not present

## 2016-02-25 DIAGNOSIS — Z130 Encounter for screening for diseases of the blood and blood-forming organs and certain disorders involving the immune mechanism: Secondary | ICD-10-CM | POA: Diagnosis not present

## 2016-02-25 DIAGNOSIS — Z3480 Encounter for supervision of other normal pregnancy, unspecified trimester: Secondary | ICD-10-CM | POA: Diagnosis not present

## 2016-02-25 DIAGNOSIS — Z13228 Encounter for screening for other metabolic disorders: Secondary | ICD-10-CM | POA: Diagnosis not present

## 2016-02-25 DIAGNOSIS — Z315 Encounter for genetic counseling: Secondary | ICD-10-CM | POA: Diagnosis not present

## 2016-02-25 LAB — OB RESULTS CONSOLE HIV ANTIBODY (ROUTINE TESTING): HIV: NONREACTIVE

## 2016-02-25 LAB — OB RESULTS CONSOLE GC/CHLAMYDIA
Chlamydia: NEGATIVE
Gonorrhea: NEGATIVE

## 2016-02-25 LAB — OB RESULTS CONSOLE RUBELLA ANTIBODY, IGM: Rubella: IMMUNE

## 2016-02-25 LAB — OB RESULTS CONSOLE ABO/RH: RH TYPE: POSITIVE

## 2016-02-25 LAB — OB RESULTS CONSOLE RPR: RPR: NONREACTIVE

## 2016-02-25 LAB — OB RESULTS CONSOLE HEPATITIS B SURFACE ANTIGEN: HEP B S AG: NEGATIVE

## 2016-03-02 ENCOUNTER — Encounter (HOSPITAL_COMMUNITY): Payer: Self-pay | Admitting: Obstetrics and Gynecology

## 2016-03-03 DIAGNOSIS — O0900 Supervision of pregnancy with history of infertility, unspecified trimester: Secondary | ICD-10-CM | POA: Diagnosis not present

## 2016-03-19 ENCOUNTER — Ambulatory Visit (HOSPITAL_COMMUNITY)
Admission: RE | Admit: 2016-03-19 | Discharge: 2016-03-19 | Disposition: A | Discharge status: Home / Self Care | Payer: 59 | Source: Ambulatory Visit | Attending: Obstetrics and Gynecology | Admitting: Obstetrics and Gynecology

## 2016-03-19 ENCOUNTER — Ambulatory Visit (HOSPITAL_COMMUNITY): Payer: 59

## 2016-03-19 ENCOUNTER — Encounter (HOSPITAL_COMMUNITY): Payer: Self-pay

## 2016-03-19 VITALS — BP 134/75 | HR 82 | Wt 215.2 lb

## 2016-03-19 DIAGNOSIS — Z3A10 10 weeks gestation of pregnancy: Secondary | ICD-10-CM

## 2016-03-19 DIAGNOSIS — O09519 Supervision of elderly primigravida, unspecified trimester: Secondary | ICD-10-CM | POA: Insufficient documentation

## 2016-03-19 DIAGNOSIS — O09511 Supervision of elderly primigravida, first trimester: Secondary | ICD-10-CM | POA: Insufficient documentation

## 2016-03-19 DIAGNOSIS — O30041 Twin pregnancy, dichorionic/diamniotic, first trimester: Secondary | ICD-10-CM | POA: Insufficient documentation

## 2016-03-19 NOTE — Progress Notes (Signed)
MFM Consult  35 year old, G1, at 10 weeks 3 days gestation seen in consultation today for AMA and diamniotic dichorionic twin gestation (per referring provider as no ultrasound here) She notes that the pregnancy has been uncomplicated to date  Past Medical History:  Diagnosis Date  . Birth control   . Chicken pox   . Depression   . GERD (gastroesophageal reflux disease)    History reviewed. No pertinent surgical history.  OB History    Gravida Para Term Preterm AB Living   1         0   SAB TAB Ectopic Multiple Live Births                 Family History  Problem Relation Age of Onset  . Arthritis Father   . Hyperlipidemia Father   . Hypertension Father   . Arthritis Paternal Grandmother   . Heart disease Paternal Grandmother   . Breast cancer Mother   . Cancer Mother 74    Breast cancer  . Miscarriages / Stillbirths Maternal Grandfather   . Diabetes Paternal Grandfather    Social History   Social History  . Marital status: Single    Spouse name: N/A  . Number of children: N/A  . Years of education: N/A   Occupational History  . Not on file.   Social History Main Topics  . Smoking status: Never Smoker  . Smokeless tobacco: Not on file  . Alcohol use 1.2 oz/week    2 Glasses of wine per week  . Drug use: No  . Sexual activity: Yes   Other Topics Concern  . Not on file   Social History Narrative  . No narrative on file   Vitals:   03/19/16 1412  BP: 134/75  Pulse: 82   Impression / Recommendations #1 Advanced maternal age - Met with our genetic counselor today and has decided on NIPD testing - Fetal anatomic survey at ~18-[redacted] weeks gestation #2 Diamniotic dichorionic twin gestation - We discussed the common issues surrounding and associated with twin gestation in pregnancy - Fetal anatomic survey at ~18-20 weeks with cervical length for preterm delivery assessment - Serial ultrasounds for fetal growth and amniotic fluid monthly starting ~24 weeks -  Daily maternal assessment for fetal movement with immediate provider contact for decrease or cessation of fetal movement starting ~28 weeks - Antenatal testing starting ~[redacted] weeks gestation - Delivery by 38 weeks if undelivered. Discussed management based on fetal presentation at delivery  Questions appear answered to her satisfaction. Precautions for the above given. Spent greater than 1/2 of 35 minute visit face to face counseling

## 2016-03-19 NOTE — Progress Notes (Signed)
Genetic Counseling  High-Risk Gestation Note  Appointment Date:  03/19/2016 Referred By: Allyn Kenner, DO Date of Birth:  Jan 22, 1981   Pregnancy History: G1P0 Estimated Date of Delivery: 10/12/16 Estimated Gestational Age: [redacted]w[redacted]d Attending: Seward Meth, MD   [name] was seen for genetic counseling because of a maternal age of 35 y.o. in a dichorionic/diamniotic twin gestation. She was accompanied by a friend to today's visit.   In summary:  Discussed AMA and associated risk for fetal aneuploidy in twin gestation  Discussed options for screening and detection rates in twin gestation  First screen-declined  Quad screen-declined  NIPS-elected to pursue today ([facility] through [place] [facility])  Ultrasound-NT ultrasound available in first trimester and anatomy ultrasound in second trimester; please contact our office, if these are desired to be scheduled with [place]  Discussed diagnostic testing options  CVS-declined  Amniocentesis-declined  Reviewed family history concerns  Patient's father has hemochromatosis; reviewed autosomal recessive inheritance and natural history of the condition  Discussed carrier screening options-patient had [facility] screen through her OB office, but we do not have records of which specific panel ordered  CF-negative screening in the past  SMA-if not included on the [facility] panel (depending on the one ordered), then patient desires this screening  Hemoglobinopathies-declined  She was counseled regarding maternal age and the association with risk for chromosome conditions due to nondisjunction with aging of the ova.   We reviewed chromosomes, nondisjunction, and the associated 1 in 70 risk for one twin to have fetal aneuploidy related to a maternal age of 35 y.o. at [redacted]w[redacted]d gestation in a twin pregnancy.  She was counseled that the risk for aneuploidy decreases as gestational age increases, accounting for those pregnancies which spontaneously  abort.  We specifically discussed Down syndrome (trisomy 87), trisomies 13 and 43, and sex chromosome aneuploidies (47,XXX and 47,XXY) including the common features and prognoses of each.   We reviewed available screening options including First Screen, Quad screen, noninvasive prenatal screening (NIPS)/cell free DNA (cfDNA) screening, and detailed ultrasound.  She was counseled that screening tests are used to modify a patient's a priori risk for aneuploidy, typically based on age. This estimate provides a pregnancy specific risk assessment. We reviewed the benefits and limitations of each option. Specifically, we discussed the conditions for which each test screens, the detection rates, and false positive rates of each. We reviewed that for screens involving maternal blood sample (First screen, Quad screen, and NIPS), detection rates are decreased in a twin gestation and that overall risk assessment is provided for the pregnancy. In the case of a positive result, it would not be able to be distinguished from the result alone, which twin is screen positive. She was also counseled regarding diagnostic testing via CVS and amniocentesis. We reviewed the approximate 1 in [id number] risk for complications from amniocentesis, including spontaneous pregnancy loss. We discussed the possible results that the tests might provide including: positive, negative, unanticipated, and no result. Finally, they were counseled regarding the cost of each option and potential out of pocket expenses.   After consideration of all the options, she elected to proceed with NIPS ([facility] through [facility]).  Those results will be available in 8-10 days.    She also expressed interest in possibly pursuing a nuchal translucency ultrasound, at approximately [redacted] weeks gestation. The patient plans to discuss this option with her OB. If this is desired to be performed in our office, please contact our office to schedule. Additionally, we discussed  the option  of detailed anatomy ultrasound in the second trimester.  She understands that screening tests cannot rule out all birth defects or genetic syndromes. The patient was advised of this limitation and states she still does not want additional testing at this time.    Jasmine Ortega was provided with written information regarding cystic fibrosis (CF), spinal muscular atrophy (SMA) and hemoglobinopathies including the carrier frequency, availability of carrier screening and prenatal diagnosis if indicated.  In addition, we discussed that CF and hemoglobinopathies are routinely screened for as part of the [place] newborn screening panel. Jasmine Ortega reported that CF was previously screened and was negative. She also recently elected to pursue [facility] carrier screening through her OB. We reviewed that this particular laboratory offers several options of panels that include a range of conditions from 4 to over 200 conditions. We do not currently have records indicating which [facility] panel was performed and do not have records of these results. If SMA was not included on the screen ordered, the patient expressed interest in pursuing.     Both family histories were reviewed and found to be contributory for hemochromatosis for the patient's father. Hemochromatosis, also referred to as HFE-associated hereditary hemochromatosis (HFE-HH), is a common genetic condition that results when a person possesses a mutation (gene alteration) in both copies of their HFE gene.  This leads to an increased amount of iron absorption and storage of iron in the body including the skin, liver, pancreas, heart, joints and testes.  Symptoms may include lethargy, weight loss, weakness or abdominal pain and may progress to increased skin pigmentation, diabetes mellitus, liver cirrhosis, arthritis or congestive heart failure if untreated.  Symptoms in females are usually milder and occur later in life (post-menopause).  This condition is  typically treated via phlebotomy. Variable expressivity and reduced penetrance have been reported, with individuals who present with only increased transferrin-iron saturation and increased serum ferritin concentration as well as individuals who are homozygous for C282Y/C282Y who do not have iron overload nor clinical manifestations. The diagnosis of clinical hemochromatosis in individuals is typically based on finding elevated transferrin-iron saturation 45% or higher and serum ferritin concentration above the upper limit of normal and finding two HFE-HH-causing mutations via molecular genetic testing.   We reviewed the autosomal recessive inheritance, meaning that each child of a carrier couple has an independent 25% (1 in 4) risk to inherit the condition.  Carriers have no symptoms of the disease and males and females are affected equally. We discussed that all offspring of an individual with hemochromatosis would be obligate carriers. Thus, Mrs. Moffa and her sister would be obligate carrier of hemochromatosis.  Approximately 1 in 9 Caucasians is a carrier for hemochromatosis. Genetic testing would not be required for Mrs. Reeser, given the he would be expected to be a carrier, given that his mother is homozygous. However, given the high carrier frequency and reduced penetrance, genetic testing would be available to determine if she would also be homozygous, which is a less likely scenario. Given the later adult onset nature of this condition, Mrs. Fortna is not interested in further evaluation for hemochromatosis at this time. Without further information regarding the provided family history, an accurate genetic risk cannot be calculated. Further genetic counseling is warranted if more information is obtained.  Additionally, Mrs. Faso reported a maternal half-sister who possibly had mild intellectual disability and also had respiratory issues, all attributed to being born premature. She was born in the  69s. She was not described  to have dysmorphic features. She also reported a paternal cousin with intellectual disability due to fetal alcohol spectrum disorder. We discussed that the reported family history is not suggestive of particular genetic etiologies and recurrence risk for relatives would likely be low. Without an identified genetic etiology, prenatal screening or testing would not necessarily be informative for the intellectual disability in the family. Without further information regarding the provided family history, an accurate genetic risk cannot be calculated. Further genetic counseling is warranted if more information is obtained.  Mrs. JANEANN PETTRY denied exposure to environmental toxins or chemical agents. She denied the use of alcohol, tobacco or street drugs. She denied significant viral illnesses during the course of her pregnancy. Her medical and surgical histories were noncontributory.    I counseled Mrs. [place] regarding the above risks and available options.  Most of the counseling was provided by Renford Dills, [employer] genetic counseling student, under my direct supervision. The approximate face-to-face time with the genetic counselor was 45 minutes.  Chipper Oman, MS,  Certified [employer] 03/19/2016

## 2016-03-23 ENCOUNTER — Ambulatory Visit (HOSPITAL_COMMUNITY): Payer: 59

## 2016-03-29 ENCOUNTER — Other Ambulatory Visit (HOSPITAL_COMMUNITY): Payer: Self-pay

## 2016-03-29 ENCOUNTER — Telehealth (HOSPITAL_COMMUNITY): Payer: Self-pay | Admitting: MS"

## 2016-03-29 NOTE — Telephone Encounter (Signed)
[place] to discuss her prenatal cell free DNA test results.  Ms. Jasmine Ortega had [facility] testing through [facility].  Testing was offered because of advanced maternal age in a twin gestation.   The patient was identified by name and DOB.  We reviewed that these are within normal limits, showing a less than 1 in 10,000 risk for trisomies 21, 18 and 13. X chromosome analysis is not able to be performed on this platform given the twin gestation, and the patient opted out of Y chromosome analysis to assess fetal sex. She understands the sensitivity and specificity of this test. She understands that this testing does not identify all genetic conditions.  All questions were answered to her satisfaction, she was encouraged to call with additional questions or concerns.  Chipper Oman, MS Insurance risk surveyor

## 2016-05-19 DIAGNOSIS — O30042 Twin pregnancy, dichorionic/diamniotic, second trimester: Secondary | ICD-10-CM | POA: Diagnosis not present

## 2016-05-19 DIAGNOSIS — O09511 Supervision of elderly primigravida, first trimester: Secondary | ICD-10-CM | POA: Diagnosis not present

## 2016-05-19 DIAGNOSIS — Z363 Encounter for antenatal screening for malformations: Secondary | ICD-10-CM | POA: Diagnosis not present

## 2016-06-16 DIAGNOSIS — Z362 Encounter for other antenatal screening follow-up: Secondary | ICD-10-CM | POA: Diagnosis not present

## 2016-06-16 DIAGNOSIS — O30042 Twin pregnancy, dichorionic/diamniotic, second trimester: Secondary | ICD-10-CM | POA: Diagnosis not present

## 2016-06-28 NOTE — L&D Delivery Note (Signed)
Delivery Note   Jasmine Ortega, [place] [[id number]]  Patient was determined to be C/C/+2 at 1919pm and AROM was performed.  Placental abruption was suspected as the amniotic fluid was very bloody, clots were passed and what appeared to be placental tissue. Baby A FHR was reassuring.   After almost one hour of effective maternal pushing at 8:09 PM a viable and healthy female was delivered via Vaginal, Spontaneous Delivery (Presentation: OA), loose nuchal cord.  APGAR: 9, 9; weight 5 lb 9.4 oz (2535 g).  Placenta left in place while delivery of Baby B occurred.   Placenta status: Intact  , .  Cord:  3 vessels  Anesthesia:  Epidural / Pudendal / Local perineal lidocaine Episiotomy: None Lacerations: 2nd degree;Sulcus Suture Repair: 2.0 vicryl rapide Est. Blood Loss (mL): 1625    [name], Jasmine Ortega [[id number]]  Once Baby A was delivered, a bedside ultrasound was performed to confirm Baby B was still vertex.  AROM was done and head descended into pelvis.  The fetal head was trying to turn transverse so a Kiwi Vacuum extractor was placed at 0 station on flexion point. With maternal effort and one pull at 8:17 PM a viable and healthy female was delivered via Vaginal, Vacuum (Extractor) (Presentation: LOA ).  APGAR: 6, 9; weight 6 lb 4.4 oz (2845 g).   Placenta status: appears intact but could be evidence of small abruption. Both Cords had 3 vessels.    with the following complications: PPH Uterine atony was present and bleeding from sulcal laceration.  IV pitocin and massage alleviated atony. Sulcal laceration and second degree perineal laceration was repaired with 2-0 vicryl rapide. Skin was closed with 3-0 chromic  Anesthesia:   Episiotomy: None Lacerations: 2nd degree Suture Repair: 2.0 vicryl rapide Est. Blood Loss (mL): 1625   Mom to postpartum / HIGH RISK OB for magnesium sulfate Baby A to Couplet care / Skin to Skin.   Baby B to Couplet care / Skin to Skin.  Willella Harding, [place] 09/15/2016, 11:02  PM

## 2016-07-07 DIAGNOSIS — N898 Other specified noninflammatory disorders of vagina: Secondary | ICD-10-CM | POA: Diagnosis not present

## 2016-07-07 DIAGNOSIS — Z3480 Encounter for supervision of other normal pregnancy, unspecified trimester: Secondary | ICD-10-CM | POA: Diagnosis not present

## 2016-07-14 DIAGNOSIS — O99810 Abnormal glucose complicating pregnancy: Secondary | ICD-10-CM | POA: Diagnosis not present

## 2016-07-21 DIAGNOSIS — O30043 Twin pregnancy, dichorionic/diamniotic, third trimester: Secondary | ICD-10-CM | POA: Diagnosis not present

## 2016-07-21 DIAGNOSIS — Z23 Encounter for immunization: Secondary | ICD-10-CM | POA: Diagnosis not present

## 2016-07-26 ENCOUNTER — Encounter: Payer: 59 | Attending: Obstetrics & Gynecology | Admitting: Skilled Nursing Facility1

## 2016-07-26 DIAGNOSIS — O24419 Gestational diabetes mellitus in pregnancy, unspecified control: Secondary | ICD-10-CM | POA: Diagnosis not present

## 2016-07-26 DIAGNOSIS — O99810 Abnormal glucose complicating pregnancy: Secondary | ICD-10-CM

## 2016-07-26 DIAGNOSIS — Z713 Dietary counseling and surveillance: Secondary | ICD-10-CM | POA: Diagnosis not present

## 2016-07-27 ENCOUNTER — Encounter: Payer: Self-pay | Admitting: Skilled Nursing Facility1

## 2016-07-27 NOTE — Progress Notes (Signed)
  Patient was seen on 07/27/2016 for Gestational Diabetes self-management class at the [facility]. The following learning objectives were met by the patient during this course:   States the definition of Gestational Diabetes  States why dietary management is important in controlling blood glucose  Describes the effects each nutrient has on blood glucose levels  Demonstrates ability to create a balanced meal plan  Demonstrates carbohydrate counting   States when to check blood glucose levels involving a total of 4 separate occurences in a day  Demonstrates proper blood glucose monitoring techniques  States the effect of stress and exercise on blood glucose levels  States the importance of limiting caffeine and abstaining from alcohol and smoking  Demonstrates the knowledge the glucometer provided in class may not be covered by their insurance and to call their insurance provider immediately after class to know which glucometer their insurance provider does cover as well as calling their physician the next day for a prescription to the glucometer their insurance does cover (if the one provided is not) as well as the lancets and strips for that meter.  Blood glucose monitor given:  Contour next Lot # [id number] Exp: 2017-03-27 Blood glucose reading: 70  Patient instructed to monitor glucose levels: FBS: 60 - <90 1 hour: <140 2 hour: <120  *Patient received handouts:  Nutrition Diabetes and Pregnancy  Carbohydrate Counting List  Patient will be seen for follow-up as needed.

## 2016-07-28 MED FILL — FREESTYLE LITE TEST STRIP: 30 days supply | Qty: 100 | Fill #0

## 2016-07-28 MED FILL — FREESTYLE LANCETS: 30 days supply | Qty: 100 | Fill #0

## 2016-07-28 MED FILL — FREESTYLE LITE METER: 1 days supply | Qty: 1 | Fill #0

## 2016-08-17 DIAGNOSIS — O30043 Twin pregnancy, dichorionic/diamniotic, third trimester: Secondary | ICD-10-CM | POA: Diagnosis not present

## 2016-08-31 MED FILL — FREESTYLE LITE TEST STRIP: 30 days supply | Qty: 100 | Fill #1

## 2016-09-08 DIAGNOSIS — Z369 Encounter for antenatal screening, unspecified: Secondary | ICD-10-CM | POA: Diagnosis not present

## 2016-09-08 DIAGNOSIS — Z3480 Encounter for supervision of other normal pregnancy, unspecified trimester: Secondary | ICD-10-CM | POA: Diagnosis not present

## 2016-09-08 LAB — OB RESULTS CONSOLE GBS: GBS: NEGATIVE

## 2016-09-15 ENCOUNTER — Inpatient Hospital Stay (HOSPITAL_COMMUNITY)
Admission: AD | Admit: 2016-09-15 | Discharge: 2016-09-17 | DRG: 774 | Disposition: A | Discharge status: Home / Self Care | Payer: 59 | Source: Ambulatory Visit | Attending: Obstetrics & Gynecology | Admitting: Obstetrics & Gynecology

## 2016-09-15 ENCOUNTER — Inpatient Hospital Stay (HOSPITAL_COMMUNITY): Payer: 59 | Admitting: Anesthesiology

## 2016-09-15 ENCOUNTER — Encounter (HOSPITAL_COMMUNITY): Payer: Self-pay

## 2016-09-15 DIAGNOSIS — O30003 Twin pregnancy, unspecified number of placenta and unspecified number of amniotic sacs, third trimester: Secondary | ICD-10-CM | POA: Diagnosis not present

## 2016-09-15 DIAGNOSIS — O1494 Unspecified pre-eclampsia, complicating childbirth: Secondary | ICD-10-CM | POA: Diagnosis not present

## 2016-09-15 DIAGNOSIS — O1490 Unspecified pre-eclampsia, unspecified trimester: Secondary | ICD-10-CM | POA: Diagnosis present

## 2016-09-15 DIAGNOSIS — O30043 Twin pregnancy, dichorionic/diamniotic, third trimester: Secondary | ICD-10-CM | POA: Diagnosis present

## 2016-09-15 DIAGNOSIS — Z8249 Family history of ischemic heart disease and other diseases of the circulatory system: Secondary | ICD-10-CM

## 2016-09-15 DIAGNOSIS — O4593 Premature separation of placenta, unspecified, third trimester: Secondary | ICD-10-CM | POA: Diagnosis present

## 2016-09-15 DIAGNOSIS — O09819 Supervision of pregnancy resulting from assisted reproductive technology, unspecified trimester: Secondary | ICD-10-CM

## 2016-09-15 DIAGNOSIS — Z3A36 36 weeks gestation of pregnancy: Secondary | ICD-10-CM

## 2016-09-15 DIAGNOSIS — K219 Gastro-esophageal reflux disease without esophagitis: Secondary | ICD-10-CM | POA: Diagnosis present

## 2016-09-15 DIAGNOSIS — O24420 Gestational diabetes mellitus in childbirth, diet controlled: Secondary | ICD-10-CM | POA: Diagnosis present

## 2016-09-15 DIAGNOSIS — O9962 Diseases of the digestive system complicating childbirth: Secondary | ICD-10-CM | POA: Diagnosis present

## 2016-09-15 DIAGNOSIS — O30009 Twin pregnancy, unspecified number of placenta and unspecified number of amniotic sacs, unspecified trimester: Secondary | ICD-10-CM

## 2016-09-15 DIAGNOSIS — O09511 Supervision of elderly primigravida, first trimester: Secondary | ICD-10-CM | POA: Diagnosis not present

## 2016-09-15 DIAGNOSIS — Z833 Family history of diabetes mellitus: Secondary | ICD-10-CM

## 2016-09-15 DIAGNOSIS — O09519 Supervision of elderly primigravida, unspecified trimester: Secondary | ICD-10-CM

## 2016-09-15 DIAGNOSIS — R030 Elevated blood-pressure reading, without diagnosis of hypertension: Secondary | ICD-10-CM | POA: Diagnosis present

## 2016-09-15 DIAGNOSIS — O2293 Venous complication in pregnancy, unspecified, third trimester: Secondary | ICD-10-CM | POA: Diagnosis not present

## 2016-09-15 DIAGNOSIS — O1495 Unspecified pre-eclampsia, complicating the puerperium: Secondary | ICD-10-CM

## 2016-09-15 HISTORY — DX: Gestational diabetes mellitus in pregnancy, unspecified control: O24.419

## 2016-09-15 LAB — COMPREHENSIVE METABOLIC PANEL
ALK PHOS: 110 U/L (ref 38–126)
ALT: 21 U/L (ref 14–54)
ALT: 27 U/L (ref 14–54)
ANION GAP: 8 (ref 5–15)
AST: 22 U/L (ref 15–41)
AST: 25 U/L (ref 15–41)
Albumin: 2.8 g/dL — ABNORMAL LOW (ref 3.5–5.0)
Albumin: 2.6 g/dL — ABNORMAL LOW (ref 3.5–5.0)
Alkaline Phosphatase: 126 U/L (ref 38–126)
Anion gap: 7 (ref 5–15)
BILIRUBIN TOTAL: 0.6 mg/dL (ref 0.3–1.2)
BUN: 19 mg/dL (ref 6–20)
BUN: 17 mg/dL (ref 6–20)
CALCIUM: 8.1 mg/dL — AB (ref 8.9–10.3)
CHLORIDE: 104 mmol/L (ref 101–111)
CO2: 22 mmol/L (ref 22–32)
CO2: 23 mmol/L (ref 22–32)
CREATININE: 0.61 mg/dL (ref 0.44–1.00)
Calcium: 8.4 mg/dL — ABNORMAL LOW (ref 8.9–10.3)
Chloride: 102 mmol/L (ref 101–111)
Creatinine, Ser: 0.7 mg/dL (ref 0.44–1.00)
GFR calc Af Amer: >60 mL/min (ref >60)
GFR calc non Af Amer: >60 mL/min (ref >60)
GFR calc non Af Amer: >60 mL/min (ref >60)
Glucose, Bld: 73 mg/dL (ref 65–99)
Glucose, Bld: 93 mg/dL (ref 65–99)
Potassium: 4.3 mmol/L (ref 3.5–5.1)
Potassium: 4.1 mmol/L (ref 3.5–5.1)
SODIUM: 133 mmol/L — AB (ref 135–145)
Sodium: 133 mmol/L — ABNORMAL LOW (ref 135–145)
TOTAL PROTEIN: 6.8 g/dL (ref 6.5–8.1)
Total Bilirubin: 0.7 mg/dL (ref 0.3–1.2)
Total Protein: 6.2 g/dL — ABNORMAL LOW (ref 6.5–8.1)

## 2016-09-15 LAB — CBC
HCT: 34.8 % — ABNORMAL LOW (ref 36.0–46.0)
HEMATOCRIT: 38.6 % (ref 36.0–46.0)
HEMOGLOBIN: 13 g/dL (ref 12.0–15.0)
HEMOGLOBIN: 11.9 g/dL — AB (ref 12.0–15.0)
MCH: 29.1 pg (ref 26.0–34.0)
MCH: 29.7 pg (ref 26.0–34.0)
MCHC: 33.7 g/dL (ref 30.0–36.0)
MCHC: 34.2 g/dL (ref 30.0–36.0)
MCV: 86.4 fL (ref 78.0–100.0)
MCV: 86.8 fL (ref 78.0–100.0)
PLATELETS: 169 10*3/uL (ref 150–400)
Platelets: 182 10*3/uL (ref 150–400)
RBC: 4.47 MIL/uL (ref 3.87–5.11)
RBC: 4.01 MIL/uL (ref 3.87–5.11)
RDW: 15.3 % (ref 11.5–15.5)
RDW: 15.4 % (ref 11.5–15.5)
WBC: 13.6 10*3/uL — AB (ref 4.0–10.5)
WBC: 21 10*3/uL — ABNORMAL HIGH (ref 4.0–10.5)

## 2016-09-15 LAB — URINALYSIS, ROUTINE W REFLEX MICROSCOPIC
Bilirubin Urine: NEGATIVE
GLUCOSE, UA: NEGATIVE mg/dL
Ketones, ur: NEGATIVE mg/dL
Nitrite: NEGATIVE
PROTEIN: 100 mg/dL — AB
Specific Gravity, Urine: 1.008 (ref 1.005–1.030)
pH: 7 (ref 5.0–8.0)

## 2016-09-15 LAB — TYPE AND SCREEN
ABO/RH(D): O POS
Antibody Screen: NEGATIVE

## 2016-09-15 LAB — ABO/RH: ABO/RH(D): O POS

## 2016-09-15 LAB — PROTEIN / CREATININE RATIO, URINE
Creatinine, Urine: 41 mg/dL
PROTEIN CREATININE RATIO: 1.29 mg/mg{creat} — AB (ref 0.00–0.15)
TOTAL PROTEIN, URINE: 53 mg/dL

## 2016-09-15 MED ORDER — EPHEDRINE 5 MG/ML INJ: 10.0000 mg | INTRAVENOUS | Status: DC | PRN

## 2016-09-15 MED ORDER — OXYTOCIN BOLUS FROM INFUSION: 500.0000 mL | Freq: Once | INTRAVENOUS | Status: DC

## 2016-09-15 MED ORDER — OXYTOCIN 40 UNITS IN LACTATED RINGERS INFUSION - SIMPLE MED: 2.5000 [IU]/h | INTRAVENOUS | Status: DC

## 2016-09-15 MED ORDER — DIPHENHYDRAMINE HCL 50 MG/ML IJ SOLN: 12.5000 mg | INTRAMUSCULAR | Status: DC | PRN

## 2016-09-15 MED ORDER — ACETAMINOPHEN 325 MG PO TABS: 650.0000 mg | ORAL_TABLET | ORAL | Status: DC | PRN

## 2016-09-15 MED ORDER — OXYTOCIN 40 UNITS IN LACTATED RINGERS INFUSION - SIMPLE MED
INTRAVENOUS | Status: AC
  Filled 2016-09-15: qty 1000

## 2016-09-15 MED ORDER — LIDOCAINE HCL (PF) 1 % IJ SOLN: 30.0000 mL | INTRAMUSCULAR | Status: DC | PRN

## 2016-09-15 MED ORDER — FLEET ENEMA 7-19 GM/118ML RE ENEM: 1.0000 | ENEMA | Freq: Once | RECTAL | Status: DC

## 2016-09-15 MED ORDER — LACTATED RINGERS IV SOLN: 500.0000 mL | Freq: Once | INTRAVENOUS | Status: DC

## 2016-09-15 MED ORDER — LACTATED RINGERS IV SOLN
500.0000 mL | INTRAVENOUS | Status: DC | PRN
Start: 2016-09-15 — End: 2016-09-15

## 2016-09-15 MED ORDER — SOD CITRATE-CITRIC ACID 500-334 MG/5ML PO SOLN: 30.0000 mL | ORAL | Status: DC | PRN

## 2016-09-15 MED ORDER — ONDANSETRON HCL 4 MG/2ML IJ SOLN
4.0000 mg | Freq: Four times a day (QID) | INTRAMUSCULAR | Status: DC | PRN
  Administered 2016-09-15: 4 mg via INTRAVENOUS
  Filled 2016-09-15: qty 2

## 2016-09-15 MED ORDER — LACTATED RINGERS IV SOLN: 500.0000 mL | INTRAVENOUS | Status: DC | PRN

## 2016-09-15 MED ORDER — SODIUM CHLORIDE 0.9 % IV SOLN: INTRAVENOUS | Status: DC

## 2016-09-15 MED ORDER — LIDOCAINE HCL (PF) 1 % IJ SOLN
INTRAMUSCULAR | Status: DC | PRN
  Administered 2016-09-15: 10 mL via EPIDURAL

## 2016-09-15 MED ORDER — MAGNESIUM SULFATE 40 G IN LACTATED RINGERS - SIMPLE
2.0000 g/h | INTRAVENOUS | Status: DC
  Administered 2016-09-15 – 2016-09-16 (×2): 2 g/h via INTRAVENOUS
  Filled 2016-09-15 (×2): qty 500

## 2016-09-15 MED ORDER — OXYCODONE-ACETAMINOPHEN 5-325 MG PO TABS: 2.0000 | ORAL_TABLET | ORAL | Status: DC | PRN

## 2016-09-15 MED ORDER — OXYCODONE-ACETAMINOPHEN 5-325 MG PO TABS: 1.0000 | ORAL_TABLET | ORAL | Status: DC | PRN

## 2016-09-15 MED ORDER — LACTATED RINGERS IV SOLN: INTRAVENOUS | Status: DC

## 2016-09-15 MED ORDER — OXYTOCIN BOLUS FROM INFUSION
500.0000 mL | Freq: Once | INTRAVENOUS | Status: AC
  Administered 2016-09-15: 500 mL via INTRAVENOUS

## 2016-09-15 MED ORDER — ONDANSETRON HCL 4 MG/2ML IJ SOLN: 4.0000 mg | Freq: Four times a day (QID) | INTRAMUSCULAR | Status: DC | PRN

## 2016-09-15 MED ORDER — LIDOCAINE HCL (PF) 1 % IJ SOLN
30.0000 mL | INTRAMUSCULAR | Status: DC | PRN
  Filled 2016-09-15 (×2): qty 30

## 2016-09-15 MED ORDER — PHENYLEPHRINE 40 MCG/ML (10ML) SYRINGE FOR IV PUSH (FOR BLOOD PRESSURE SUPPORT): 80.0000 ug | PREFILLED_SYRINGE | INTRAVENOUS | Status: DC | PRN

## 2016-09-15 MED ORDER — OXYTOCIN 40 UNITS IN LACTATED RINGERS INFUSION - SIMPLE MED
2.5000 [IU]/h | INTRAVENOUS | Status: DC
  Filled 2016-09-15: qty 1000

## 2016-09-15 MED ORDER — FENTANYL 2.5 MCG/ML BUPIVACAINE 1/10 % EPIDURAL INFUSION (WH - ANES)
14.0000 mL/h | INTRAMUSCULAR | Status: DC | PRN
  Administered 2016-09-15: 14 mL/h via EPIDURAL
  Filled 2016-09-15: qty 100

## 2016-09-15 MED ORDER — MAGNESIUM SULFATE BOLUS VIA INFUSION
4.0000 g | Freq: Once | INTRAVENOUS | Status: AC
Start: 2016-09-15 — End: 2016-09-15
  Administered 2016-09-15: 4 g via INTRAVENOUS
  Filled 2016-09-15: qty 500

## 2016-09-15 MED ORDER — PHENYLEPHRINE 40 MCG/ML (10ML) SYRINGE FOR IV PUSH (FOR BLOOD PRESSURE SUPPORT)
80.0000 ug | PREFILLED_SYRINGE | INTRAVENOUS | Status: DC | PRN
  Filled 2016-09-15: qty 10

## 2016-09-15 MED ORDER — LACTATED RINGERS IV SOLN
INTRAVENOUS | Status: DC
  Administered 2016-09-15: 16:00:00 via INTRAVENOUS

## 2016-09-15 NOTE — Progress Notes (Signed)
Report received from Lynnda Shields, RN; Pt. Currently pushing.

## 2016-09-15 NOTE — Anesthesia Preprocedure Evaluation (Signed)
Anesthesia Evaluation  Patient identified by MRN, date of birth, ID band Patient awake    Reviewed: Allergy & Precautions, NPO status , Patient's Chart, lab work & pertinent test results  Airway Mallampati: II  TM Distance: >3 FB Neck ROM: Full    Dental no notable dental hx.    Pulmonary neg pulmonary ROS,    Pulmonary exam normal breath sounds clear to auscultation       Cardiovascular hypertension, negative cardio ROS Normal cardiovascular exam Rhythm:Regular Rate:Normal     Neuro/Psych negative neurological ROS  negative psych ROS   GI/Hepatic negative GI ROS, Neg liver ROS, GERD  ,  Endo/Other  negative endocrine ROSdiabetes  Renal/GU negative Renal ROS  negative genitourinary   Musculoskeletal negative musculoskeletal ROS (+)   Abdominal   Peds negative pediatric ROS (+)  Hematology negative hematology ROS (+)   Anesthesia Other Findings   Reproductive/Obstetrics negative OB ROS                             Anesthesia Physical Anesthesia Plan  ASA: III  Anesthesia Plan: Epidural   Post-op Pain Management:    Induction:   Airway Management Planned: Natural Airway  Additional Equipment:   Intra-op Plan:   Post-operative Plan:   Informed Consent: I have reviewed the patients History and Physical, chart, labs and discussed the procedure including the risks, benefits and alternatives for the proposed anesthesia with the patient or authorized representative who has indicated his/her understanding and acceptance.   Dental advisory given  Plan Discussed with: CRNA  Anesthesia Plan Comments:         Anesthesia Quick Evaluation

## 2016-09-15 NOTE — MAU Provider Note (Signed)
Patient Jasmine Ortega is a 36 year old G1P0 at 39 weeks and 1 day with Di-di twins. SHe was sent from Dr. Tyler Aas office after the patient had some elevated pressures at her doctors office and each twin had a BPP 6/8. Patient denies headache, blurry vision or epigastric pain. Denies sudden swelling.  Her pregnancy history significant for gestational diabetes diagnosed in second trimester.  History     CSN: [id number]  Arrival date and time: 09/15/16 1445   First Provider Initiated Contact with Patient 09/15/16 1531      Chief Complaint  Patient presents with  . Hypertension   HPI  OB History    Gravida Para Term Preterm AB Living   1 1   1   1    SAB TAB Ectopic Multiple Live Births         1 1      Past Medical History:  Diagnosis Date  . Birth control   . Chicken pox   . Depression   . GERD (gastroesophageal reflux disease)   . Gestational diabetes     History reviewed. No pertinent surgical history.  Family History  Problem Relation Age of Onset  . Arthritis Father   . Hyperlipidemia Father   . Hypertension Father   . Arthritis Paternal Grandmother   . Heart disease Paternal Grandmother   . Breast cancer Mother   . Cancer Mother 46    Breast cancer  . Miscarriages / Stillbirths Maternal Grandfather   . Diabetes Paternal Grandfather     Social History  Substance Use Topics  . Smoking status: Never Smoker  . Smokeless tobacco: Not on file  . Alcohol use 1.2 oz/week    2 Glasses of wine per week    Allergies:  Allergies  Allergen Reactions  . Meloxicam Anaphylaxis    Also hives    Prescriptions Prior to Admission  Medication Sig Dispense Refill Last Dose  . amphetamine-dextroamphetamine (ADDERALL) 10 MG tablet Take 1 tablet (10 mg total) by mouth 2 (two) times daily. (Patient not taking: Reported on 01/07/2016) 60 tablet 0 Not Taking  . amphetamine-dextroamphetamine (ADDERALL) 10 MG tablet Take 1 tablet (10 mg total) by mouth 2 (two) times daily. (Patient  not taking: Reported on 01/07/2016) 60 tablet 0 Not Taking  . amphetamine-dextroamphetamine (ADDERALL) 10 MG tablet Take 1 tablet (10 mg total) by mouth 2 (two) times daily. (Patient not taking: Reported on 01/07/2016) 60 tablet 0 Not Taking  . Choriogonadotropin Alfa (OVIDREL) 250 MCG/0.5ML injection Inject 250 mcg into the skin once.   Not Taking  . letrozole (FEMARA) 2.5 MG tablet Take 7.5 mg by mouth daily.   Not Taking  . loratadine (CLARITIN) 10 MG tablet Take 10 mg by mouth daily. Reported on 01/07/2016   Not Taking  . metFORMIN (GLUCOPHAGE) 1000 MG tablet Take 1,000 mg by mouth 2 (two) times daily with a meal. PCOS   Not Taking  . Multiple Vitamin (MULTIVITAMIN) tablet Take 1 tablet by mouth daily.   Not Taking  . norethindrone-ethinyl estradiol 1/35 (ORTHO-NOVUM, NORTREL,CYCLAFEM) tablet Take 1 tablet by mouth daily. Reported on 01/07/2016   Not Taking  . Prenatal Vit-Fe Fumarate-FA (PRENATAL VITAMIN PO) Take by mouth.   Taking    Review of Systems  Constitutional: Negative.   HENT: Negative.   Respiratory: Negative.   Cardiovascular: Negative.   Gastrointestinal: Negative.   Endocrine: Negative.   Genitourinary: Positive for vaginal bleeding.       Pink tinged mucous discharge  Musculoskeletal:  Negative.   Skin: Negative.   Neurological: Negative.   Psychiatric/Behavioral: Negative.    Physical Exam   Blood pressure (!) 152/93, pulse 96, temperature 98 F (36.7 C), temperature source Oral, resp. rate 20, last menstrual period 01/06/2016.  Physical Exam  Constitutional: She is oriented to person, place, and time. She appears well-developed and well-nourished.  HENT:  Head: Normocephalic.  Eyes: Pupils are equal, round, and reactive to light.  Neck: Normal range of motion.  Cardiovascular: Normal rate.   Respiratory: Effort normal.  Genitourinary:  Genitourinary Comments: 2 cm, thin, ballotable,   Musculoskeletal: Normal range of motion.  Neurological: She is alert and  oriented to person, place, and time.  Skin: Skin is warm and dry.    MAU Course  Procedures  MDM -UA -UPC -CBC, CMP -ABO -NST baby A 125 bpm; moderate variabiliyt, present accels, negative decels Baby B: 135 bpm; moderate variabiliyt, present accels, negative decls Contractions 4 in 10 minutes, non-painful.   Patient is stable at this time. Assessment and Plan  Per Dr. Alwyn Pea, patient to be admitted for L and D for induction.   Mervyn Skeeters Jasmine Ortega 09/15/2016, 3:33 PM

## 2016-09-15 NOTE — MAU Note (Signed)
Pt sent from MD office, BP is elevated, has twins, BPP 6/8 on both babies today.  Denies contractions or LOF, has pink tinged mucus since yesterday.  Denies HA or visual changes.

## 2016-09-15 NOTE — Anesthesia Pain Management Evaluation Note (Signed)
  CRNA Pain Management Visit Note  Patient: Jasmine Ortega, 36 y.o., female  "Hello I am a member of the anesthesia team at [facility]. We have an anesthesia team available at all times to provide care throughout the hospital, including epidural management and anesthesia for C-section. I don't know your plan for the delivery whether it a natural birth, water birth, IV sedation, nitrous supplementation, doula or epidural, but we want to meet your pain goals."   1.Was your pain managed to your expectations on prior hospitalizations?   Yes   2.What is your expectation for pain management during this hospitalization?     Epidural  3.How can we help you reach that goal? epidural  Record the patient's initial score and the patient's pain goal.   Pain: 0  Pain Goal: 4 [facility] wants you to be able to say your pain was always managed very well.  Willa Rough 09/15/2016

## 2016-09-15 NOTE — Anesthesia Procedure Notes (Signed)
Epidural Patient location during procedure: OB Start time: 09/15/2016 5:45 PM End time: 09/15/2016 6:01 PM  Staffing Anesthesiologist: Candida Peeling RAY Performed: anesthesiologist   Preanesthetic Checklist Completed: patient identified, site marked, surgical consent, pre-op evaluation, timeout performed, IV checked, risks and benefits discussed and monitors and equipment checked  Epidural Patient position: sitting Prep: DuraPrep Patient monitoring: heart rate, cardiac monitor, continuous pulse ox and blood pressure Approach: midline Location: L2-L3 Injection technique: LOR saline  Needle:  Needle type: Tuohy  Needle gauge: 17 G Needle length: 9 cm Needle insertion depth: 6 cm Catheter type: closed end flexible Catheter size: 20 Guage Catheter at skin depth: 9 cm Test dose: negative  Assessment Events: blood not aspirated, injection not painful, no injection resistance, negative IV test and no paresthesia  Additional Notes Reason for block:procedure for pain

## 2016-09-15 NOTE — H&P (Signed)
Jasmine Ortega is a 36 y.o. femaleG1P0 Di/Di Twin Pregnancy at 18 weeks presenting from office for Elevated BP to 150 /90, headache, trace urine protein, and both babies 6/10 BPP.   Discussed with MFM staff, and they agreed with elevated BPs in MAU to severe range to go ahead and proceed with delivery.   Babies confirmed today on office [place] to be Vertex and Vertex   OB History    Gravida Para Term Preterm AB Living   1         0   SAB TAB Ectopic Multiple Live Births                 Past Medical History:  Diagnosis Date  . Birth control   . Chicken pox   . Depression   . GERD (gastroesophageal reflux disease)   . Gestational diabetes    History reviewed. No pertinent surgical history. Family History: family history includes Arthritis in her father and paternal grandmother; Breast cancer in her mother; Cancer (age of onset: 26) in her mother; Diabetes in her paternal grandfather; Heart disease in her paternal grandmother; Hyperlipidemia in her father; Hypertension in her father; Miscarriages / [place] in her maternal grandfather. Social History:  reports that she has never smoked. She has never used smokeless tobacco. She reports that she drinks about 1.2 oz of alcohol per week . She reports that she does not use drugs.     Maternal Diabetes: Yes:  Diabetes Type:  Diet controlled Genetic Screening: Abnormal:  Results: Other: NIPT normal; Mother carrier for SMA and PKD FOB neg Maternal Ultrasounds/Referrals: Normal Fetal Ultrasounds or other Referrals:  Other: Normal Maternal Substance Abuse:  No Significant Maternal Medications:  None Significant Maternal Lab Results:  Lab values include: Group B Strep negative Other Comments:  None  Review of Systems  Eyes: Negative for blurred vision and double vision.  Cardiovascular: Negative for chest pain.  Gastrointestinal: Negative for abdominal pain.  Neurological: Positive for headaches.  All other systems reviewed and are  negative.  Maternal Medical History:  Reason for admission: Induction of labor for preeclampsia w/ severe features  Contractions: Onset was 6-12 hours ago.   Frequency: regular.   Duration is approximately 60 seconds.   Perceived severity is moderate.    Fetal activity: Perceived fetal activity is normal.   Last perceived fetal movement was within the past 24 hours.    Prenatal complications: Pre-eclampsia.   Prenatal Complications - Diabetes: gestational. Diabetes is managed by diet.      Dilation: 3 Effacement (%): 80 Exam by:: Kathrine K. CNM  Blood pressure (!) 147/84, pulse 91, temperature 98.4 F (36.9 C), temperature source Oral, resp. rate 16, height 5\' 4"  (1.626 m), weight 113.9 kg (251 lb), last menstrual period 01/06/2016. Exam Physical Exam  Prenatal labs: ABO, Rh: --/--/O POS (03/21 1539) Antibody: NEG (03/21 1539) Rubella:  Immune RPR:   NR HBsAg:   Neg HIV:   Neg GBS:  Neg   Assessment/Plan: 36 yo Di/Di Twin pregnancy with preeclampsia / poor fetal well being testing  Protein /Cr confirms dx and BPs on L&D mild range.  Patient notes HA is better will hold magnesium for now or until post partum.   Discussed with patient IV pitocin / AROM  Epidural on demand  Continuous monitoring of both babies Goal is for NSVD for both babies.    [place], [place] 09/15/2016, 5:45 PM

## 2016-09-16 ENCOUNTER — Encounter (HOSPITAL_COMMUNITY): Payer: Self-pay

## 2016-09-16 DIAGNOSIS — O1495 Unspecified pre-eclampsia, complicating the puerperium: Secondary | ICD-10-CM | POA: Diagnosis present

## 2016-09-16 LAB — CBC
HCT: 28.9 % — ABNORMAL LOW (ref 36.0–46.0)
Hemoglobin: 9.8 g/dL — ABNORMAL LOW (ref 12.0–15.0)
MCH: 29.3 pg (ref 26.0–34.0)
MCHC: 33.9 g/dL (ref 30.0–36.0)
MCV: 86.3 fL (ref 78.0–100.0)
PLATELETS: 153 10*3/uL (ref 150–400)
RBC: 3.35 MIL/uL — AB (ref 3.87–5.11)
RDW: 15.4 % (ref 11.5–15.5)
WBC: 19.7 10*3/uL — ABNORMAL HIGH (ref 4.0–10.5)

## 2016-09-16 LAB — RPR: RPR Ser Ql: NONREACTIVE

## 2016-09-16 MED ORDER — IBUPROFEN 600 MG PO TABS
600.0000 mg | ORAL_TABLET | Freq: Four times a day (QID) | ORAL | Status: DC
  Administered 2016-09-16 – 2016-09-17 (×5): 600 mg via ORAL
  Filled 2016-09-16 (×5): qty 1

## 2016-09-16 MED ORDER — PRENATAL MULTIVITAMIN CH
1.0000 | ORAL_TABLET | Freq: Every day | ORAL | Status: DC
  Administered 2016-09-17: 1 via ORAL
  Filled 2016-09-16: qty 1

## 2016-09-16 MED ORDER — COCONUT OIL OIL
1.0000 "application " | TOPICAL_OIL | Status: DC | PRN
  Administered 2016-09-17: 1 via TOPICAL
  Filled 2016-09-16: qty 120

## 2016-09-16 MED ORDER — SIMETHICONE 80 MG PO CHEW: 80.0000 mg | CHEWABLE_TABLET | ORAL | Status: DC | PRN

## 2016-09-16 MED ORDER — SENNOSIDES-DOCUSATE SODIUM 8.6-50 MG PO TABS
2.0000 | ORAL_TABLET | ORAL | Status: DC
  Administered 2016-09-16 (×2): 2 via ORAL
  Filled 2016-09-16 (×2): qty 2

## 2016-09-16 MED ORDER — ACETAMINOPHEN 325 MG PO TABS: 650.0000 mg | ORAL_TABLET | ORAL | Status: DC | PRN

## 2016-09-16 MED ORDER — DIPHENHYDRAMINE HCL 25 MG PO CAPS: 25.0000 mg | ORAL_CAPSULE | Freq: Four times a day (QID) | ORAL | Status: DC | PRN

## 2016-09-16 MED ORDER — ZOLPIDEM TARTRATE 5 MG PO TABS: 5.0000 mg | ORAL_TABLET | Freq: Every evening | ORAL | Status: DC | PRN

## 2016-09-16 MED ORDER — LACTATED RINGERS IV SOLN
INTRAVENOUS | Status: DC
  Administered 2016-09-16 (×2): via INTRAVENOUS

## 2016-09-16 MED ORDER — BENZOCAINE-MENTHOL 20-0.5 % EX AERO
1.0000 "application " | INHALATION_SPRAY | CUTANEOUS | Status: DC | PRN
  Administered 2016-09-17: 1 via TOPICAL
  Filled 2016-09-16: qty 56

## 2016-09-16 MED ORDER — ONDANSETRON HCL 4 MG/2ML IJ SOLN: 4.0000 mg | INTRAMUSCULAR | Status: DC | PRN

## 2016-09-16 MED ORDER — DIBUCAINE 1 % RE OINT: 1.0000 "application " | TOPICAL_OINTMENT | RECTAL | Status: DC | PRN

## 2016-09-16 MED ORDER — TETANUS-DIPHTH-ACELL PERTUSSIS 5-2.5-18.5 LF-MCG/0.5 IM SUSP: 0.5000 mL | Freq: Once | INTRAMUSCULAR | Status: DC

## 2016-09-16 MED ORDER — ONDANSETRON HCL 4 MG PO TABS: 4.0000 mg | ORAL_TABLET | ORAL | Status: DC | PRN

## 2016-09-16 MED ORDER — WITCH HAZEL-GLYCERIN EX PADS: 1.0000 "application " | MEDICATED_PAD | CUTANEOUS | Status: DC | PRN

## 2016-09-16 NOTE — Lactation Note (Signed)
This note was copied from a baby's chart. Lactation Consultation Note  Patient Name: Jasmine Ortega Jasmine Ortega Date: 09/16/2016 Reason for consult: Initial assessment;Late preterm infant;Multiple gestation;Infant < 6lbs   Initial consult at 18 hrs for LPTI Twins; GA 36.1.  Mom is a P1. Baby A - BW 5 lbs, 9.4 oz.  Infant has formula fed via finger feeding with curved-tip syringe x4 (10 ml) + attempted breastfeeding x1 (5 min); voids-3; stools-1 Baby B - Bw 6 lbs, 4.4 oz.  Infant has formula fed via finger feeding with curved-tip syringe x4 (8-10 ml) + attempted breastfeeding x2 (5 min); voids-1; stools-1  Educated on feeding infants with feeding cues and following Green LPTI Guideline sheet.  Green LPTI sheet given to parents and explained plan of care with rationale.  Mom states she has hand pumped once and used DEBP twice.  Stated she was only able to get a few drops.  Encouraged to limit total time of feedings to 30 minutes and explained rationale.  Reviewed supplementation amounts from green sheet.  Explained behaviors of LPTI and the need to feed every 2.5-3 hrs and with feeding cues.  Reviewed hand expression with return demonstration and observation of tiny drop of colostrum appearing on nipple tip.  Latched Baby A.    [name] showing feeding cues upon entering room, easily latched to right breast in football hold STS with mom; minimal stimulation needed to keep him in a sucking pattern, good rhythm.  Mom reports feeling strong tug.  Few swallows heard.  After 15 minutes infant came off breast on his own.  LS-8.    Female visitors came into room at end of Baby A's feeding.  LC left to get supplies.  Brought spoons, foley cups, colostrum collection containers, and a 5 [place] (for finger feeding).  Explained to parents how to use for supplemental feeding.   Parents not ready to latch baby B "Jasmine Ortega" at this time.  LC reported to RN of consult and need to supplemental feed A and feed B.  LC  left floor.    Upon coming back to floor at 1600, Jasmine Ortega received 9 ml formula via foley cup.   RN was able to get Baby B on breast but stated he did not breastfeed consistently, very sleepy, LS-6, no swallows heard.  Baby B took 10 ml formula "easily" via foley cup (per dad).  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   Lactation brochure given and informed of hospital support group.  Encouraged parents to call for assistance as needed with feedings.     Maternal Data Has patient been taught Hand Expression?: Yes Does the patient have breastfeeding experience prior to this delivery?: No  Feeding Feeding Type: Breast Fed Length of feed: 15 min  LATCH Score/Interventions Latch: Grasps breast easily, tongue down, lips flanged, rhythmical sucking. (Minimal stimulation needed to keep infant in sucking pattern; good tug) Intervention(s): Assist with latch;Breast compression  Audible Swallowing: A few with stimulation Intervention(s): Skin to skin;Hand expression  Type of Nipple: Everted at rest and after stimulation  Comfort (Breast/Nipple): Soft / non-tender     Hold (Positioning): Assistance needed to correctly position infant at breast and maintain latch. Intervention(s): Breastfeeding basics reviewed;Support Pillows;Skin to skin  LATCH Score: 8  Lactation Tools Discussed/Used WIC Program: No ([employer] Employee) Pump Review: Setup, frequency, and cleaning Initiated by:: RN Date initiated:: 09/16/16   Consult Status Consult Status:  Follow-up Date: 09/17/16 Follow-up type: In-patient    Jasmine Ortega 09/16/2016, 3:07 PM

## 2016-09-16 NOTE — Anesthesia Postprocedure Evaluation (Signed)
Anesthesia Post Note  Patient: Jasmine Ortega  Procedure(s) Performed: * No procedures listed *  Patient location during evaluation: Women's Unit Anesthesia Type: Epidural Level of consciousness: awake, awake and alert and oriented Pain management: pain level controlled Vital Signs Assessment: post-procedure vital signs reviewed and stable Respiratory status: spontaneous breathing, nonlabored ventilation and respiratory function stable Cardiovascular status: stable Postop Assessment: no headache, no backache, patient able to bend at knees, no signs of nausea or vomiting and adequate PO intake Anesthetic complications: no        Last Vitals:  Vitals:   09/16/16 0530 09/16/16 0630  BP: 103/64 (!) 114/57  Pulse: 100 78  Resp: 18 16  Temp: 36.7 C 36.6 C    Last Pain:  Vitals:   09/16/16 0630  TempSrc: Oral  PainSc: 0-No pain   Pain Goal: Patients Stated Pain Goal: 2 (09/16/16 0234)               Lanecia Sliva

## 2016-09-16 NOTE — Progress Notes (Signed)
Post Partum Day 1 Subjective: no complaints, up ad lib, voiding and tolerating PO  Objective: Blood pressure (!) 109/59, pulse 80, temperature 97.8 F (36.6 C), temperature source Oral, resp. rate 16, height 5\' 4"  (1.626 m), weight 113.9 kg (251 lb), last menstrual period 01/06/2016, SpO2 100 %, unknown if currently breastfeeding.  Physical Exam:  General: alert, cooperative and appears stated age 36: appropriate Uterine Fundus: firm DVT Evaluation: No evidence of DVT seen on physical exam.   Recent Labs  09/15/16 2203 09/16/16 0512  HGB 11.9* 9.8*  HCT 34.8* 28.9*    Assessment/Plan: Continue Magnesium until 24 PP BPs normotensive now that PP. May increase when off mag Breastfeeding but has not had pumped or breastfed yet Desire neonatal circumcision. Risks, benefits, alternatives reviewed. Patient wishes to proceed. Given no breastfeeding yet will postpone circs until tomorrow    LOS: 1 day   Jasmine Hagger H. 09/16/2016, 9:14 AM

## 2016-09-16 NOTE — Progress Notes (Signed)
Pt. Transferred to 3rd floor - [facility] OB unit.  Before transfer, RN assist pt. To BR for post delivery void, pt. Unable to void, pericare given, clean underwear and peripad put in place.  Pt. Ready for transfer to 3rd floor via WC. LR with  40U Pitocin in place - infusing at 62.5 ml/hr; Magnesium Sulfate infusing at 2 g/hr; both on IV pump.  Infants (twins) in separate bassinets, transferred via tech.  Pt. Along with infants stable.

## 2016-09-17 ENCOUNTER — Ambulatory Visit: Payer: Self-pay

## 2016-09-17 NOTE — Progress Notes (Signed)
Patient is eating, ambulating, voiding.  Pain control is good.  Vitals:   09/16/16 2100 09/16/16 2200 09/17/16 0020 09/17/16 0410  BP:   (!) 141/74 113/65  Pulse:   94 60  Resp: 20 20 18 18   Temp:   98.3 F (36.8 C) 98 F (36.7 C)  TempSrc:   Oral Oral  SpO2:   97% 97%  Weight:      Height:        Fundus firm Perineum without swelling.  Lab Results  Component Value Date   WBC 19.7 (H) 09/16/2016   HGB 9.8 (L) 09/16/2016   HCT 28.9 (L) 09/16/2016   MCV 86.3 09/16/2016   PLT 153 09/16/2016    --/--/O POS, O POS (03/21 1539)/RI  A/P Post partum day 2. Twins, preeclampsia and off MgSO4.   Routine care.  Expect d/c routine- BPs better.  Parents desire circ today.  Iron.  Watch BPs today and consider d/c later if doing well.    Lexii Walsh A

## 2016-09-17 NOTE — Lactation Note (Signed)
This note was copied from a baby's chart. Lactation Consultation Note  With this first tiime mom and twin babies, now 106 hours old, and 36 3/7 weeks CGA. I assisted mom with football hold with Karle Starch. He weighs 5 lbs 15. 4 oz. He was not interested in breastfeeding, so was offered a bottle og Alimentum formula. He was sucking well, but not transferring well - He had taken about 15 ml's, and was still being fed when I left the room.  On exam of his mouth, has has what appears to be a short posterior frenulum, a tongue that seems he can not elevate well, and this may or may not effect breast feeding. Mom aware.  Mom was given oral restriction information already, due to Baby A' s tongue .   Mom feeling better with her feeding plan,. Due to sore nipples, she has decided to not try to latch her babies for a few days, and will bottle feed babies , and then pump, at least every 3 hours, 8 times a day. Mom will feed EBM prior to formula, once her milk transitions in.  Mom knows to call for questions/concerns.   Patient Name: Jasmine Ortega [id number] Date: 09/17/2016     Maternal Data    Feeding    LATCH Score/Interventions                      Lactation Tools Discussed/Used     Consult Status      Tonna Corner 09/17/2016, 6:41 PM

## 2016-09-17 NOTE — Lactation Note (Signed)
This note was copied from a baby's chart. Lactation Consultation Note   With mom and baby A, [name], now 90 hours old.He is 36 3/6 weeks CGa, and weight is 5 lbs 6.4 oz.  Mom was trying to breast fed when I walked into the room, and I repositioned the mom and baby  for football hold, and then applied a 20 nipple shield, and relatched the baby.  He was able to stay latched deeper with shield, but was still sleepy. LPI behavior reviewed with mom.  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 I gave mom oral restriction resources, and advised her to look up some of the websites, and get information. I also told her to speak to Dr. Dante Gang about my findings.I also told mom that she  Could use a bottle to supplement breast feeding, instead of syringe and foley cups.I also tole mom she did not need to breast feed baby with each feeding, bur could bottle feed them , EBM first, then formula, and then pump. Mom was very happy to hear this, stating the she was already stressed from trying to latch each baby, then feed supplement and pump.[name] bottle fed very well. sidelying taught to mom.    Mom very receptive to teaching. She and babies will be discharged to home today. Mom was able to get her free [employer] employee  DEP from the lactation store. Mom knows to call lactation for any questions/concerns. Breast care/engorgement reviewed. Mom given information on o/p lactation consults also.   Patient Name: Jasmine Ortega [id number] Date: 09/17/2016     Maternal Data    Feeding    LATCH Score/Interventions                      Lactation Tools Discussed/Used     Consult Status      Jasmine Ortega 09/17/2016, 4:53 PM

## 2016-09-17 NOTE — Progress Notes (Signed)
Discharge teaching complete, vitals stable, lactation plan in place and reviewed. Both infants were feed and changed prior to discharging in their car seats with patient and father of infants. Escorted by NTs to remove hugs tag. Pt left ambulating, Saline Lock was removed prior to discharge.

## 2016-09-17 NOTE — Discharge Summary (Addendum)
Obstetric Discharge Summary Reason for Admission: induction of labor and preeclampsia Prenatal Procedures: NST and Preeclampsia Intrapartum Procedures: spontaneous vaginal delivery of A and vacuum of B Postpartum Procedures: none Complications-Operative and Postpartum: 2 degree perineal laceration Hemoglobin  Date Value Ref Range Status  09/16/2016 9.8 (L) 12.0 - 15.0 g/dL Final   HCT  Date Value Ref Range Status  09/16/2016 28.9 (L) 36.0 - 46.0 % Final    Discharge Diagnoses: Term Pregnancy-delivered and Preelampsia (twins)  Discharge Information: Date: 09/17/2016 Activity: pelvic rest Diet: routine Medications: Ibuprofen and Iron Condition: stable Instructions: refer to practice specific booklet Discharge to: home Follow-up Information    PINN, Andrez Grime, MD Follow up in 4 week(s).   Specialty:  Obstetrics and Gynecology Contact information: [place] [address] [phone]          Can come to office in 2 weeks for BP check  Newborn Data:   Jameeka, Marcy [[id number]]  Live born female  Birth Weight: 5 lb 9.4 oz (2535 g) APGAR: 9, 9   Rakeisha, Nyce [[id number]]  Live born female  Birth Weight: 6 lb 4.4 oz (2845 g) APGAR: 6, 9  Home with mother.  Lorelee Mclaurin A 09/17/2016, 8:03 AM

## 2016-09-17 NOTE — Progress Notes (Signed)
Vitals:   09/17/16 0020 09/17/16 0410 09/17/16 0803 09/17/16 1200  BP: (!) 141/74 113/65 116/63 121/66  Pulse: 94 60 81 97  Resp: 18 18 18 18   Temp: 98.3 F (36.8 C) 98 F (36.7 C) 98 F (36.7 C) 97.9 F (36.6 C)  TempSrc: Oral Oral Oral Oral  SpO2: 97% 97% 97% 98%  Weight:      Height:        BPs are stable and not elevated over last 3 readings.  Pt was assymptomatic this am.  Ok to D/C to home.

## 2016-09-30 ENCOUNTER — Ambulatory Visit: Payer: Self-pay

## 2016-09-30 NOTE — Lactation Note (Signed)
This note was copied from a baby's chart. Lactation Consult  Mother's reason for visit:  Breast pump help Visit Type:  consultation Appointment Notes:  Exclusively pumping for 2 week twins Consult:  Initial Lactation Consultant:  Ave Filter  ________________________________________________________________________    ________________________________________________________________________  Mother's Name: Jasmine Ortega Type of delivery:  Vaginal, Spontaneous Delivery Breastfeeding Experience:  First babies Maternal Medical Conditions:  Pregnancy induced hypertension   ________________________________________________________________________ Mom of 36 week old twins here to check flange sizes and discuss pumping.  She is pumping every 3 hours and skips one pumping at night.  She obtains 30-120 mls each pumping with Medela Pump In Style.  Observed pumping in office.  24 mm flanges are a good fit on both breasts.  Answered pumping questions.  Stressed importance of pumping 8-12 times/24 hours with additional power pumping twice per day.  She just started fenugreek and may discuss Reglan with physician.  Encouraged to call with concerns prn.                     ________________________________________________________________________        _______________________________________________________________________

## 2016-10-19 MED FILL — NORETHINDRONE 0.35 MG TAB: 0.35 | 84 days supply | Qty: 84 | Fill #0

## 2016-11-04 ENCOUNTER — Other Ambulatory Visit: Payer: Self-pay | Admitting: Family Medicine

## 2016-11-04 ENCOUNTER — Encounter: Payer: Self-pay | Admitting: Family Medicine

## 2016-11-04 MED ORDER — AMPHETAMINE SULFATE 5 MG PO TABS: 1.0000 | ORAL_TABLET | Freq: Every day | ORAL | 0 refills | Status: DC

## 2016-11-04 NOTE — Progress Notes (Signed)
She is now no longer breast-feeding and would like to try Evekeo. I will write for a 5 mg and have her keep in touch with me.

## 2016-11-14 ENCOUNTER — Telehealth: Payer: Self-pay | Admitting: Family Medicine

## 2016-11-14 NOTE — Telephone Encounter (Signed)
P.A. Irine Seal

## 2016-11-17 MED FILL — EVEKEO 5 MG TABLET: 5 | 30 days supply | Qty: 30 | Fill #0

## 2016-11-19 NOTE — Telephone Encounter (Signed)
P.A. Approved til 11/07/17, faxed pharmacy, left message for pt

## 2016-11-29 DIAGNOSIS — Z3202 Encounter for pregnancy test, result negative: Secondary | ICD-10-CM | POA: Diagnosis not present

## 2016-11-29 DIAGNOSIS — Z30430 Encounter for insertion of intrauterine contraceptive device: Secondary | ICD-10-CM | POA: Diagnosis not present

## 2017-01-12 DIAGNOSIS — Z30431 Encounter for routine checking of intrauterine contraceptive device: Secondary | ICD-10-CM | POA: Diagnosis not present

## 2017-02-09 ENCOUNTER — Encounter: Payer: Self-pay | Admitting: Family Medicine

## 2017-02-09 DIAGNOSIS — M79672 Pain in left foot: Secondary | ICD-10-CM

## 2017-02-14 MED ORDER — AMPHETAMINE SULFATE 5 MG PO TABS: 1.0000 | ORAL_TABLET | Freq: Every day | ORAL | 0 refills | Status: DC

## 2017-02-16 ENCOUNTER — Encounter: Payer: Self-pay | Admitting: Internal Medicine

## 2017-02-16 ENCOUNTER — Ambulatory Visit
Admission: RE | Admit: 2017-02-16 | Discharge: 2017-02-16 | Disposition: A | Discharge status: Home / Self Care | Payer: 59 | Source: Ambulatory Visit | Attending: Family Medicine | Admitting: Family Medicine

## 2017-02-16 DIAGNOSIS — M79672 Pain in left foot: Secondary | ICD-10-CM

## 2017-02-16 MED FILL — EVEKEO 5 MG TABLET: 5 | 30 days supply | Qty: 30 | Fill #0

## 2017-02-17 ENCOUNTER — Other Ambulatory Visit: Payer: Self-pay | Admitting: Family Medicine

## 2017-02-17 DIAGNOSIS — Z0000 Encounter for general adult medical examination without abnormal findings: Secondary | ICD-10-CM

## 2017-02-18 LAB — CBC WITH DIFFERENTIAL/PLATELET
BASOS PCT: 1 %
Basophils Absolute: 89 cells/uL (ref 0–200)
EOS PCT: 3 %
Eosinophils Absolute: 267 cells/uL (ref 15–500)
HCT: 42.5 % (ref 35.0–45.0)
Hemoglobin: 14.3 g/dL (ref 11.7–15.5)
LYMPHS PCT: 34 %
Lymphs Abs: 3026 cells/uL (ref 850–3900)
MCH: 28.9 pg (ref 27.0–33.0)
MCHC: 33.6 g/dL (ref 32.0–36.0)
MCV: 86 fL (ref 80.0–100.0)
MONOS PCT: 7 %
MPV: 10.3 fL (ref 7.5–12.5)
Monocytes Absolute: 623 cells/uL (ref 200–950)
Neutro Abs: 4895 cells/uL (ref 1500–7800)
Neutrophils Relative %: 55 %
PLATELETS: 260 10*3/uL (ref 140–400)
RBC: 4.94 MIL/uL (ref 3.80–5.10)
RDW: 15.1 % — AB (ref 11.0–15.0)
WBC: 8.9 10*3/uL (ref 4.0–10.5)

## 2017-02-19 LAB — COMPREHENSIVE METABOLIC PANEL WITH GFR
ALT: 19 U/L (ref 6–29)
AST: 16 U/L (ref 10–30)
Albumin: 4.5 g/dL (ref 3.6–5.1)
Alkaline Phosphatase: 68 U/L (ref 33–115)
BUN: 16 mg/dL (ref 7–25)
CO2: 21 mmol/L (ref 20–32)
Calcium: 9.2 mg/dL (ref 8.6–10.2)
Chloride: 103 mmol/L (ref 98–110)
Creat: 0.68 mg/dL (ref 0.50–1.10)
Glucose, Bld: 84 mg/dL (ref 65–99)
Potassium: 4.6 mmol/L (ref 3.5–5.3)
Sodium: 138 mmol/L (ref 135–146)
Total Bilirubin: 1 mg/dL (ref 0.2–1.2)
Total Protein: 7.3 g/dL (ref 6.1–8.1)

## 2017-02-19 LAB — LIPID PANEL
Cholesterol: 224 mg/dL — ABNORMAL HIGH
HDL: 57 mg/dL
LDL Cholesterol: 133 mg/dL — ABNORMAL HIGH
Total CHOL/HDL Ratio: 3.9 ratio
Triglycerides: 168 mg/dL — ABNORMAL HIGH
VLDL: 34 mg/dL — ABNORMAL HIGH

## 2017-03-09 DIAGNOSIS — Z124 Encounter for screening for malignant neoplasm of cervix: Secondary | ICD-10-CM | POA: Diagnosis not present

## 2017-03-09 DIAGNOSIS — Z01419 Encounter for gynecological examination (general) (routine) without abnormal findings: Secondary | ICD-10-CM | POA: Diagnosis not present

## 2017-03-09 DIAGNOSIS — Z13220 Encounter for screening for lipoid disorders: Secondary | ICD-10-CM | POA: Diagnosis not present

## 2017-03-09 DIAGNOSIS — Z1329 Encounter for screening for other suspected endocrine disorder: Secondary | ICD-10-CM | POA: Diagnosis not present

## 2017-03-09 DIAGNOSIS — Z130 Encounter for screening for diseases of the blood and blood-forming organs and certain disorders involving the immune mechanism: Secondary | ICD-10-CM | POA: Diagnosis not present

## 2017-03-09 MED FILL — ESCITALOPRAM 10 MG TABLET: 10 | 30 days supply | Qty: 30 | Fill #0

## 2017-03-10 ENCOUNTER — Encounter: Payer: Self-pay | Admitting: Family Medicine

## 2017-04-02 MED FILL — ESCITALOPRAM 10 MG TABLET: 10 | 30 days supply | Qty: 30 | Fill #1

## 2017-04-27 ENCOUNTER — Ambulatory Visit (INDEPENDENT_AMBULATORY_CARE_PROVIDER_SITE_OTHER): Payer: 59 | Admitting: Family Medicine

## 2017-04-27 ENCOUNTER — Encounter: Payer: Self-pay | Admitting: Family Medicine

## 2017-04-27 VITALS — BP 126/82 | HR 85 | Ht 64.0 in | Wt 221.4 lb

## 2017-04-27 DIAGNOSIS — K219 Gastro-esophageal reflux disease without esophagitis: Secondary | ICD-10-CM

## 2017-04-27 DIAGNOSIS — Z0000 Encounter for general adult medical examination without abnormal findings: Secondary | ICD-10-CM | POA: Diagnosis not present

## 2017-04-27 DIAGNOSIS — M654 Radial styloid tenosynovitis [de Quervain]: Secondary | ICD-10-CM | POA: Diagnosis not present

## 2017-04-27 DIAGNOSIS — F900 Attention-deficit hyperactivity disorder, predominantly inattentive type: Secondary | ICD-10-CM | POA: Diagnosis not present

## 2017-04-27 DIAGNOSIS — E785 Hyperlipidemia, unspecified: Secondary | ICD-10-CM

## 2017-04-27 NOTE — Progress Notes (Signed)
   Subjective:    Patient ID: Jasmine Ortega, female    DOB: 22-Jan-1981, 36 y.o.   MRN: [id number]  HPI She is here for complete examination. She now has a set of 99-month-old twins it is keeping her quite busy. She is now back at work. She has been using Lexapro to help with some postpartum symptoms. She describes symptoms more suggestive of heading towards depression but not really getting to that level. The Lexapro tenderness helping to smooth things out. Her husband seems be quite helpful with taking care of the children and with help around the house. She has had difficulty with left foot pain mainly over the first tarsometatarsal area. She has a previous history of plantar fasciitis. She also has noted some difficulty with right wrist pain from lifting her children. Review of the record also indicates elevated lipids. She does have underlying ADD but presently is on no medication. Her exercise as been minimal due to her work and take care of the children. She does not smoke. She does occasionally have reflux symptoms and uses a PPI with good results. Family and social history as well as health maintenance and immunizations were reviewed   Review of Systems  All other systems reviewed and are negative.      Objective:   Physical Exam Alert and in no distress. Tympanic membranes and canals are normal. Pharyngeal area is normal. Neck is supple without adenopathy or thyromegaly. Cardiac exam shows a regular sinus rhythm without murmurs or gallops. Lungs are clear to auscultation. Abdominal exam shows no masses or tenderness. Normal bowel sounds exam of the right wrist does show positive Finkelstein test.        Assessment & Plan:  Routine general medical examination at a health care facility  Attention deficit hyperactivity disorder (ADHD), predominantly inattentive type  De Quervain's tenosynovitis, right  Hyperlipidemia, unspecified hyperlipidemia type Encouraged her to remain as  physically active as possible. Discussed treatment of the wrist and at this point will essentially do avoidance. Recommended are supports for her foot pain. Use a PPI as needed for her reflux symptoms. Discussed the Lexapro and recommend staying on it at least through the winter months and possibly longer.

## 2017-05-06 MED FILL — ESCITALOPRAM 10 MG TABLET: 10 | 30 days supply | Qty: 30 | Fill #2

## 2017-06-08 MED FILL — ESCITALOPRAM 10 MG TABLET: 10 | 30 days supply | Qty: 30 | Fill #3

## 2017-07-07 MED FILL — ESCITALOPRAM 10 MG TABLET: 10 | 30 days supply | Qty: 30 | Fill #4

## 2017-08-08 MED FILL — ESCITALOPRAM 10 MG TABLET: 10 | 30 days supply | Qty: 30 | Fill #5

## 2017-08-17 DIAGNOSIS — H5213 Myopia, bilateral: Secondary | ICD-10-CM | POA: Diagnosis not present

## 2017-09-06 MED FILL — ESCITALOPRAM 10 MG TABLET: 10 | 30 days supply | Qty: 30 | Fill #6

## 2017-09-08 ENCOUNTER — Other Ambulatory Visit: Payer: Self-pay | Admitting: Family Medicine

## 2017-09-08 ENCOUNTER — Encounter: Payer: Self-pay | Admitting: Family Medicine

## 2017-09-08 MED ORDER — AMPHETAMINE-DEXTROAMPHET ER 10 MG PO CP24: 10.0000 mg | ORAL_CAPSULE | Freq: Every day | ORAL | 0 refills | Status: DC

## 2017-09-08 MED FILL — ADDERALL XR 10 MG CAP SA: 10 | 30 days supply | Qty: 30 | Fill #0

## 2017-09-08 NOTE — Progress Notes (Signed)
She called concerning her underlying ADD.  The Adderall that she took in college was very short acting.  Did cause her to crash after that.  She has tried Adderall 10 mg and she also tries Evekeo with little success.  I discussed options with her and will give her Adderall XR 15.  She will let me know if it works, how long and if she has any difficulties with it.

## 2017-09-19 ENCOUNTER — Encounter: Payer: Self-pay | Admitting: Family Medicine

## 2017-09-19 MED ORDER — ONDANSETRON HCL 4 MG PO TABS: 4.0000 mg | ORAL_TABLET | Freq: Three times a day (TID) | ORAL | 0 refills | Status: DC | PRN

## 2017-09-19 NOTE — Telephone Encounter (Signed)
She is having difficulty with vomiting from gastroenteritis.  I will call in Zofran.

## 2017-10-07 MED FILL — ESCITALOPRAM 10 MG TABLET: 10 | 30 days supply | Qty: 30 | Fill #7

## 2017-10-17 ENCOUNTER — Encounter: Payer: Self-pay | Admitting: Family Medicine

## 2017-10-17 ENCOUNTER — Other Ambulatory Visit: Payer: Self-pay | Admitting: Family Medicine

## 2017-10-17 MED ORDER — AMPHETAMINE-DEXTROAMPHET ER 10 MG PO CP24: 10.0000 mg | ORAL_CAPSULE | Freq: Every day | ORAL | 0 refills | Status: DC

## 2017-10-17 MED FILL — ADDERALL XR 10 MG CAP SA: 10 | 30 days supply | Qty: 30 | Fill #0

## 2017-10-26 ENCOUNTER — Encounter: Payer: Self-pay | Admitting: Family Medicine

## 2017-11-08 MED FILL — ESCITALOPRAM 10 MG TABLET: 10 | 30 days supply | Qty: 30 | Fill #8

## 2017-11-16 DIAGNOSIS — Z0279 Encounter for issue of other medical certificate: Secondary | ICD-10-CM

## 2017-11-24 ENCOUNTER — Other Ambulatory Visit: Payer: Self-pay | Admitting: Family Medicine

## 2017-11-24 ENCOUNTER — Encounter: Payer: Self-pay | Admitting: Family Medicine

## 2017-11-24 DIAGNOSIS — Z8349 Family history of other endocrine, nutritional and metabolic diseases: Secondary | ICD-10-CM

## 2017-12-06 ENCOUNTER — Other Ambulatory Visit: Payer: Self-pay | Admitting: Internal Medicine

## 2017-12-06 DIAGNOSIS — Z8349 Family history of other endocrine, nutritional and metabolic diseases: Secondary | ICD-10-CM

## 2017-12-07 MED FILL — ESCITALOPRAM 10 MG TABLET: 10 | 30 days supply | Qty: 30 | Fill #9

## 2017-12-08 LAB — EXTRA LAV TOP TUBE

## 2017-12-08 LAB — IRON: Iron: 95 ug/dL (ref 40–190)

## 2017-12-08 LAB — FERRITIN: FERRITIN: 128 ng/mL (ref 10–154)

## 2017-12-08 MED FILL — ADDERALL XR 10 MG CAP SA: 10 | 30 days supply | Qty: 30 | Fill #0

## 2018-01-05 MED FILL — ESCITALOPRAM 10 MG TABLET: 10 | 30 days supply | Qty: 30 | Fill #10

## 2018-02-07 MED FILL — ESCITALOPRAM 10 MG TABLET: 10 | 30 days supply | Qty: 30 | Fill #11

## 2018-03-06 ENCOUNTER — Encounter: Payer: Self-pay | Admitting: Family Medicine

## 2018-03-07 MED FILL — ESCITALOPRAM 10 MG TABLET: 10 | 30 days supply | Qty: 30 | Fill #0

## 2018-03-08 MED FILL — ADDERALL XR 10 MG CAP SA: 10 | 30 days supply | Qty: 30 | Fill #0

## 2018-03-24 ENCOUNTER — Encounter: Payer: Self-pay | Admitting: Family Medicine

## 2018-03-29 DIAGNOSIS — Z01419 Encounter for gynecological examination (general) (routine) without abnormal findings: Secondary | ICD-10-CM | POA: Diagnosis not present

## 2018-03-29 DIAGNOSIS — Z124 Encounter for screening for malignant neoplasm of cervix: Secondary | ICD-10-CM | POA: Diagnosis not present

## 2018-03-29 MED FILL — ESCITALOPRAM 20 MG TABLET: 20 | 90 days supply | Qty: 90 | Fill #0

## 2018-06-14 ENCOUNTER — Other Ambulatory Visit: Payer: Self-pay | Admitting: Obstetrics and Gynecology

## 2018-06-14 DIAGNOSIS — N6489 Other specified disorders of breast: Secondary | ICD-10-CM

## 2018-06-14 DIAGNOSIS — N644 Mastodynia: Secondary | ICD-10-CM

## 2018-07-04 MED FILL — ESCITALOPRAM 20 MG TABLET: 20 | 90 days supply | Qty: 90 | Fill #1

## 2018-07-05 ENCOUNTER — Ambulatory Visit
Admission: RE | Admit: 2018-07-05 | Discharge: 2018-07-05 | Disposition: A | Discharge status: Home / Self Care | Payer: 59 | Source: Ambulatory Visit | Attending: Obstetrics and Gynecology | Admitting: Obstetrics and Gynecology

## 2018-07-05 DIAGNOSIS — N644 Mastodynia: Secondary | ICD-10-CM

## 2018-07-05 DIAGNOSIS — N6489 Other specified disorders of breast: Secondary | ICD-10-CM | POA: Diagnosis not present

## 2018-07-05 DIAGNOSIS — Z803 Family history of malignant neoplasm of breast: Secondary | ICD-10-CM | POA: Diagnosis not present

## 2018-07-05 DIAGNOSIS — R928 Other abnormal and inconclusive findings on diagnostic imaging of breast: Secondary | ICD-10-CM | POA: Diagnosis not present

## 2018-07-11 ENCOUNTER — Encounter: Payer: Self-pay | Admitting: Family Medicine

## 2018-07-12 ENCOUNTER — Other Ambulatory Visit: Payer: Self-pay

## 2018-07-12 ENCOUNTER — Encounter: Payer: Self-pay | Admitting: Family Medicine

## 2018-07-12 ENCOUNTER — Ambulatory Visit: Payer: 59 | Admitting: Family Medicine

## 2018-07-12 VITALS — BP 112/70 | HR 75 | Temp 98.1°F | Wt 227.2 lb

## 2018-07-12 DIAGNOSIS — Z8349 Family history of other endocrine, nutritional and metabolic diseases: Secondary | ICD-10-CM

## 2018-07-12 DIAGNOSIS — E785 Hyperlipidemia, unspecified: Secondary | ICD-10-CM

## 2018-07-12 DIAGNOSIS — Z0000 Encounter for general adult medical examination without abnormal findings: Secondary | ICD-10-CM

## 2018-07-12 DIAGNOSIS — F900 Attention-deficit hyperactivity disorder, predominantly inattentive type: Secondary | ICD-10-CM

## 2018-07-12 DIAGNOSIS — K219 Gastro-esophageal reflux disease without esophagitis: Secondary | ICD-10-CM

## 2018-07-12 DIAGNOSIS — M545 Low back pain, unspecified: Secondary | ICD-10-CM

## 2018-07-12 MED ORDER — AMPHETAMINE-DEXTROAMPHET ER 10 MG PO CP24: 10.0000 mg | ORAL_CAPSULE | Freq: Every day | ORAL | 0 refills | Status: DC

## 2018-07-12 NOTE — Progress Notes (Signed)
   Subjective:    Patient ID: Jasmine Ortega, female    DOB: Aug 14, 1980, 38 y.o.   MRN: [id number]  HPI She is here for complete examination.  She does complain some intermittent back pain that usually occurs at the end of the day and she thinks is associated with standing for long periods of time.  No numbness, tingling or weakness.  She also occasionally notes some coccygeal discomfort if she sits for a long period of time.  She also has a family history of hemochromatosis.  She has been on Lexapro given to her postpartum by her gynecologist and has done fairly well on this medication.  She does have ADD and uses Adderall XR 10 and states that it lasts the whole day, approximately 8 hours.  She has no difficulty while she is on this. Her work is going well.  She has 2 fraternal twin boys at home.  Her husband works at home.  She and her husband are in the process of trying to make some diet and exercise changes as they both need help.  Her reflux is under good control.  Family and social history as well as health maintenance and immunizations were reviewed.  She does see her gynecologist regularly.  Review of Systems  All other systems reviewed and are negative.      Objective:   Physical Exam  Alert and in no distress. Tympanic membranes and canals are normal. Pharyngeal area is normal. Neck is supple without adenopathy or thyromegaly. Cardiac exam shows a regular sinus rhythm without murmurs or gallops. Lungs are clear to auscultation.  Abdominal exam shows no masses or tenderness.  Reflexes are normal.  Straight leg raising test negative.        Assessment & Plan:  Routine medical exam - Plan: Comprehensive metabolic panel, Lipid Panel, CBC with Differential  Family history of hemochromatosis  Attention deficit hyperactivity disorder (ADHD), predominantly inattentive type - Plan: amphetamine-dextroamphetamine (ADDERALL XR) 10 MG 24 hr capsule, amphetamine-dextroamphetamine (ADDERALL XR)  10 MG 24 hr capsule, amphetamine-dextroamphetamine (ADDERALL XR) 10 MG 24 hr capsule  Hyperlipidemia, unspecified hyperlipidemia type  Gastroesophageal reflux disease, esophagitis presence not specified  Low back pain without sciatica, unspecified back pain laterality, unspecified chronicity - Plan: Ambulatory referral to Physical Therapy I discussed proper back care as well as sitting with her to help minimize the back pain as well as coccygeal discomfort.  Will refer to physical therapy to have them teach her proper back rehab. Recommend she discuss with her gynecologist possibly stopping the Lexapro. Also discussed diet and exercise with her.  Recommended 20 minutes of something physical daily or 150 minutes a week of something.  Also discussed cutting back on carbohydrates and potentially setting a goal pants or dress size.

## 2018-07-12 NOTE — Patient Instructions (Signed)
20 minutes of something physical daily or 150 minutes a week of something physical

## 2018-07-13 LAB — CBC WITH DIFFERENTIAL/PLATELET
Basophils Absolute: 0.1 10*3/uL (ref 0.0–0.2)
Basos: 1 %
EOS (ABSOLUTE): 0.2 10*3/uL (ref 0.0–0.4)
Eos: 3 %
HEMOGLOBIN: 14 g/dL (ref 11.1–15.9)
Hematocrit: 41.6 % (ref 34.0–46.6)
Immature Grans (Abs): 0.1 10*3/uL (ref 0.0–0.1)
Immature Granulocytes: 1 %
Lymphocytes Absolute: 2.7 10*3/uL (ref 0.7–3.1)
Lymphs: 32 %
MCH: 29.4 pg (ref 26.6–33.0)
MCHC: 33.7 g/dL (ref 31.5–35.7)
MCV: 87 fL (ref 79–97)
Monocytes Absolute: 0.6 10*3/uL (ref 0.1–0.9)
Monocytes: 7 %
NEUTROS PCT: 56 %
Neutrophils Absolute: 4.8 10*3/uL (ref 1.4–7.0)
Platelets: 267 10*3/uL (ref 150–450)
RBC: 4.76 x10E6/uL (ref 3.77–5.28)
RDW: 12.6 % (ref 11.7–15.4)
WBC: 8.5 10*3/uL (ref 3.4–10.8)

## 2018-07-13 LAB — COMPREHENSIVE METABOLIC PANEL
ALT: 17 IU/L (ref 0–32)
AST: 14 IU/L (ref 0–40)
Albumin/Globulin Ratio: 1.7 (ref 1.2–2.2)
Albumin: 4.4 g/dL (ref 3.5–5.5)
Alkaline Phosphatase: 75 IU/L (ref 39–117)
BUN/Creatinine Ratio: 18 (ref 9–23)
BUN: 13 mg/dL (ref 6–20)
Bilirubin Total: 0.7 mg/dL (ref 0.0–1.2)
CO2: 24 mmol/L (ref 20–29)
Calcium: 9 mg/dL (ref 8.7–10.2)
Chloride: 102 mmol/L (ref 96–106)
Creatinine, Ser: 0.71 mg/dL (ref 0.57–1.00)
GFR calc Af Amer: 125 mL/min/{1.73_m2} (ref >59)
GFR calc non Af Amer: 108 mL/min/{1.73_m2} (ref >59)
Globulin, Total: 2.6 g/dL (ref 1.5–4.5)
Glucose: 87 mg/dL (ref 65–99)
Potassium: 4.8 mmol/L (ref 3.5–5.2)
SODIUM: 140 mmol/L (ref 134–144)
Total Protein: 7 g/dL (ref 6.0–8.5)

## 2018-07-13 LAB — LIPID PANEL
Chol/HDL Ratio: 3.3 ratio (ref 0.0–4.4)
Cholesterol, Total: 212 mg/dL — ABNORMAL HIGH (ref 100–199)
HDL: 64 mg/dL (ref >39)
LDL Calculated: 123 mg/dL — ABNORMAL HIGH (ref 0–99)
Triglycerides: 127 mg/dL (ref 0–149)
VLDL Cholesterol Cal: 25 mg/dL (ref 5–40)

## 2018-07-26 ENCOUNTER — Encounter: Payer: Self-pay | Admitting: Physical Therapy

## 2018-07-26 ENCOUNTER — Ambulatory Visit: Payer: 59 | Attending: Family Medicine | Admitting: Physical Therapy

## 2018-07-26 DIAGNOSIS — G8929 Other chronic pain: Secondary | ICD-10-CM | POA: Diagnosis not present

## 2018-07-26 DIAGNOSIS — M545 Low back pain: Secondary | ICD-10-CM | POA: Insufficient documentation

## 2018-07-26 NOTE — Therapy (Signed)
[place], [place], [address] Phone: [phone]   Fax:  [phone]  Physical Therapy Evaluation  Patient Details  Name: Jasmine Ortega MRN: [id number] Date of Birth: 1980-10-06 Referring Provider (PT): Jill Alexanders MD   Encounter Date: 07/26/2018  PT End of Session - 07/26/18 2240    Visit Number  1    Number of Visits  12    Date for PT Re-Evaluation  09/06/18    Authorization Type  [place] employee [employer]    PT Start Time  3557    PT Stop Time  1635    PT Time Calculation (min)  50 min    Activity Tolerance  Patient tolerated treatment well    Behavior During Therapy  [facility] for tasks assessed/performed       Past Medical History:  Diagnosis Date  . Birth control   . Chicken pox   . Depression   . GERD (gastroesophageal reflux disease)   . Gestational diabetes     History reviewed. No pertinent surgical history.  There were no vitals filed for this visit.   Subjective Assessment - 07/26/18 1553    Subjective  Pt relays LBP after giving birth 2 years ago. 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 She does seem to favor lumbar flexion describing hooklying and knees to chest and posterior pelvic tilts help her back some    Limitations  Walking;Standing    How long can you sit comfortably?  10 min to an hour    How long can you stand comfortably?  one hour    How long can you walk comfortably?  2 hours    Diagnostic tests  no recent    Patient Stated Goals  decrease pain,     Currently in Pain?  Yes    Pain Score  3     Pain Location  Back    Pain Orientation  Mid    Pain Descriptors / Indicators  Aching    Pain Type  Chronic pain    Pain Radiating Towards  denies    Pain Onset  More than a month ago    Pain Frequency  Intermittent    Aggravating Factors   laying supine without knees bent    Pain  Relieving Factors  flexion    Effect of Pain on Daily Activities  limits    Multiple Pain Sites  No         [facility] PT Assessment - 07/26/18 0001      Assessment   Medical Diagnosis  Chronic LBP    Referring Provider (PT)  Jill Alexanders MD    Onset Date/Surgical Date  --   pain for last 2 years after giving birth   Next MD Visit  --   nothing scheduled   Prior Therapy  none      Precautions   Precautions  None      Restrictions   Weight Bearing Restrictions  No      Balance Screen   Has the patient fallen in the past 6 months  No      [name] residence      Prior Function   Level of Independence  Independent   currently independent   Vocation  Full time employment    Vocation Requirements  NP in peds  Leisure  play with kids      Cognition   Overall Cognitive Status  Within Functional Limits for tasks assessed      Observation/Other Assessments   Focus on Therapeutic Outcomes (FOTO)   30% limited      Sensation   Light Touch  Appears Intact      Coordination   Gross Motor Movements are Fluid and Coordinated  Yes      Posture/Postural Control   Posture Comments  anterior pelvic tilt, prefers to shift weight over to Rt LE in standing      ROM / Strength   AROM / PROM / Strength  AROM;Strength      AROM   Overall AROM   Within functional limits for tasks performed    Overall AROM Comments  lumbar      Strength   Overall Strength Comments  LE strength 5/5 MMT except hip ext 4/5 bilat    Strength Assessment Site  --      Flexibility   Soft Tissue Assessment /Muscle Length  --   tight H.S, lumbar P.S     Palpation   Spinal mobility  good    SI assessment   neg SI tests      Special Tests   Other special tests  neg SLR, neg quadrant, neg slump, no provoked pain today except with lumbar ext end range ROM      Ambulation/Gait   Gait Comments  WNL                Objective measurements completed on  examination: See above findings.      [place] Adult PT Treatment/Exercise - 07/26/18 0001      Modalities   Modalities  Electrical Stimulation      Electrical Stimulation   Electrical Stimulation Location  lumbar    Electrical Stimulation Action  IFC    Electrical Stimulation Parameters  tolerance, supine with legs elevated     Electrical Stimulation Goals  Pain             PT Education - 07/26/18 2239    Education Details  HEP, TENS, POC, exam findings    Person(s) Educated  Patient    Methods  Explanation;Handout;Demonstration    Comprehension  Need further instruction;Verbalized understanding          PT Long Term Goals - 07/26/18 2247      PT LONG TERM GOAL #1   Title  Pt will be I and compliant with HEP. 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     Time  6    Status  New      PT LONG TERM GOAL #3   Title  Pt will improve FOTO to less than 21% limited.     Time  6    Status  New      PT LONG TERM GOAL #4   Title  Pt will improve hip strength to overall 5/5 MMT to improve function.     Time  6    Period  Weeks    Status  New             Plan - 07/26/18 2241    Clinical Impression Statement  Pt presents with Chronic LBP that  started 2 years ago after pregancy. There are no red flags or radiculopathy present. Special testing negative today and pain was not able to be provoked other than with end range extension and she appears to have direction preference to flexion based stretching today. She does have anterior pelvic tilt, decrased standing and walking tolerance, decreased hip strength, and increased pain limiting her function She will benefit from skilled PT to address her defecits.     History and Personal Factors relevant to plan of care:  PMH: ADHD, GERD,    Clinical Presentation  Evolving    Clinical  Presentation due to:  up and down pain levels for last 2 years    Clinical Decision Making  Moderate    Rehab Potential  Good    PT Frequency  2x / week   1-2   PT Duration  6 weeks    PT Treatment/Interventions  Cryotherapy;Electrical Stimulation;Iontophoresis 4mg /ml Dexamethasone;Moist Heat;Traction;Ultrasound;Therapeutic activities;Therapeutic exercise;Neuromuscular re-education;Manual techniques;Passive range of motion;Dry needling;Taping;Spinal Manipulations;Joint Manipulations    PT Next Visit Plan  review HEP, appeared to favor flexion based program, needs core and hip ext strength    PT Home Exercise Plan  piriformis and HS stretch, KTC stretch, bridge, prone over pilow hip ext    Consulted and Agree with Plan of Care  Patient       Patient will benefit from skilled therapeutic intervention in order to improve the following deficits and impairments:  Decreased activity tolerance, Difficulty walking, Decreased endurance, Hypomobility, Decreased strength, Impaired flexibility, Increased muscle spasms, Improper body mechanics, Postural dysfunction, Pain  Visit Diagnosis: Chronic bilateral low back pain without sciatica     Problem List Patient Active Problem List   Diagnosis Date Noted  . Gastroesophageal reflux disease 04/27/2017  . Attention deficit hyperactivity disorder (ADHD), predominantly inattentive type 03/05/2015  . Loss of transverse plantar arch of right foot 09/18/2014    Debbe Odea, PT, DPT 07/26/2018, 10:51 PM  [facility] [address] [place], [place], [address] Phone: [phone]   Fax:  [phone]  Name: TANISE RUSSMAN MRN: [id number] Date of Birth: 19-Aug-1980

## 2018-07-26 NOTE — Patient Instructions (Addendum)
  Access Code: [email or web address]  URL: [email or web address]  Date: 07/26/2018  Prepared by: Elsie Ra   Exercises  Supine Piriformis Stretch - 10 reps - 3 sets - 2x daily - 6x weekly  Supine Double Knee to Chest - 3 reps - 1 sets - 30 hold - 2x daily - 6x weekly  Supine Hamstring Stretch with Strap - 10 reps - 3 sets - 2x daily - 6x weekly  Supine Bridge - 10 reps - 1-3 sets - 2x daily - 6x weekly  Hooklying Sequential Leg March and Lower - 10 reps - 3 sets - 2x daily - 6x weekly  Prone Hip Extension - Two Pillows - 10 reps - 3 sets - 2x daily - 6x weekly   TENS UNIT: This is helpful for muscle pain and spasm.   Search and Purchase a TENS 7000 2nd edition at [employer] It should be less than $30.     TENS unit instructions: Do not shower or bathe with the unit on Turn the unit off before removing electrodes or batteries If the electrodes lose stickiness add a drop of water to the electrodes after they are disconnected from the unit and place on plastic sheet. If you continued to have difficulty, call the TENS unit company to purchase more electrodes. Do not apply lotion on the skin area prior to use. Make sure the skin is clean and dry as this will help prolong the life of the electrodes. After use, always check skin for unusual red areas, rash or other skin difficulties. If there are any skin problems, does not apply electrodes to the same area. Never remove the electrodes from the unit by pulling the wires. Do not use the TENS unit or electrodes other than as directed. Do not change electrode placement without consultating your therapist or physician. Keep 2 fingers with between each electrode. Wear time ratio is 2:1, on to off times.    For example on for 30 minutes off for 15 minutes and then on for 30 minutes off for 15 minutes

## 2018-08-02 ENCOUNTER — Ambulatory Visit: Payer: 59 | Attending: Family Medicine | Admitting: Physical Therapy

## 2018-08-02 DIAGNOSIS — G8929 Other chronic pain: Secondary | ICD-10-CM | POA: Insufficient documentation

## 2018-08-02 DIAGNOSIS — M545 Low back pain, unspecified: Secondary | ICD-10-CM

## 2018-08-02 NOTE — Therapy (Signed)
[place], [place], [address] Phone: [phone]   Fax:  [phone]  Physical Therapy Treatment  Patient Details  Name: Jasmine Ortega MRN: [id number] Date of Birth: 04-19-1981 Referring Provider (PT): Jill Alexanders MD   Encounter Date: 08/02/2018  PT End of Session - 08/02/18 1854    Visit Number  2    Number of Visits  12    Date for PT Re-Evaluation  09/06/18    Authorization Type  [place] employee [employer]    PT Start Time  1550    PT Stop Time  1635    PT Time Calculation (min)  45 min    Activity Tolerance  Patient tolerated treatment well    Behavior During Therapy  [facility] for tasks assessed/performed       Past Medical History:  Diagnosis Date  . Birth control   . Chicken pox   . Depression   . GERD (gastroesophageal reflux disease)   . Gestational diabetes     No past surgical history on file.  There were no vitals filed for this visit.  Subjective Assessment - 08/02/18 1850    Subjective  Pt relays back feels a little better    Currently in Pain?  Yes    Pain Score  2     Pain Location  Back    Pain Orientation  Mid    Pain Descriptors / Indicators  Aching    Pain Type  Chronic pain                       [facility] Adult PT Treatment/Exercise - 08/02/18 0001      Exercises   Exercises  Lumbar      Lumbar Exercises: Stretches   Double Knee to Chest Stretch Limitations  5 sec X 15 reps feet on Pball    Piriformis Stretch  Right;Left;2 reps;30 seconds    Other Lumbar Stretch Exercise  supine hip flexor stretch 30 sec X 3 each supine with strap    Other Lumbar Stretch Exercise  sitting lumbar flexion P ball roll outs fwd, Lt, Rt, 5 sec X 10 ea      Lumbar Exercises: Aerobic   Recumbent Bike  5 min L2      Lumbar Exercises: Standing   Row  20 reps    Theraband Level (Row)  Level 3 (Green)    Shoulder Extension  20 reps    Theraband Level (Shoulder Extension)  Level 3  (Green)      Lumbar Exercises: Supine   Bridge  15 reps    Bridge Limitations  feet on P ball      Lumbar Exercises: Prone   Straight Leg Raise  15 reps    Straight Leg Raises Limitations  laying over 2 pillows under hips      Modalities   Modalities  Electrical Stimulation      Electrical Stimulation   Electrical Stimulation Location  lumbar    Electrical Stimulation Action  IFC    Electrical Stimulation Parameters  tolerance, supine legs elevated    Electrical Stimulation Goals  Pain             PT Education - 08/02/18 1854    Education Details  HEP review    Person(s) Educated  Patient    Methods  Explanation;Demonstration;Verbal cues    Comprehension  Verbalized understanding;Returned demonstration;Need further instruction  PT Long Term Goals - 07/26/18 2247      PT LONG TERM GOAL #1   Title  Pt will be I and compliant with HEP. 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     Time  6    Status  New      PT LONG TERM GOAL #3   Title  Pt will improve FOTO to less than 21% limited.     Time  6    Status  New      PT LONG TERM GOAL #4   Title  Pt will improve hip strength to overall 5/5 MMT to improve function.     Time  6    Period  Weeks    Status  New            Plan - 08/02/18 1855    Clinical Impression Statement  Session focused on HEP review then therex for flexion based lumbar stretching and mobility along with some core stablization with good tolerance. TENS applied post session to reduce pain. PT will continue to progress as able toward functional goals    Rehab Potential  Good    PT Frequency  2x / week    PT Duration  6 weeks    PT Treatment/Interventions  Cryotherapy;Electrical Stimulation;Iontophoresis 4mg /ml Dexamethasone;Moist Heat;Traction;Ultrasound;Therapeutic activities;Therapeutic  exercise;Neuromuscular re-education;Manual techniques;Passive range of motion;Dry needling;Taping;Spinal Manipulations;Joint Manipulations    PT Next Visit Plan  appeared to favor flexion based program, needs core and hip ext strength    PT Home Exercise Plan  piriformis and HS stretch, KTC stretch, bridge, prone over pilow hip ext    Consulted and Agree with Plan of Care  Patient       Patient will benefit from skilled therapeutic intervention in order to improve the following deficits and impairments:  Decreased activity tolerance, Difficulty walking, Decreased endurance, Hypomobility, Decreased strength, Impaired flexibility, Increased muscle spasms, Improper body mechanics, Postural dysfunction, Pain  Visit Diagnosis: Chronic bilateral low back pain without sciatica     Problem List Patient Active Problem List   Diagnosis Date Noted  . Gastroesophageal reflux disease 04/27/2017  . Attention deficit hyperactivity disorder (ADHD), predominantly inattentive type 03/05/2015  . Loss of transverse plantar arch of right foot 09/18/2014    Silvestre Mesi 08/02/2018, 6:59 PM  [place], [place], [address] Phone: [phone]   Fax:  [phone]  Name: ANTONAE ZBIKOWSKI MRN: [id number] Date of Birth: 01/16/1981

## 2018-08-09 ENCOUNTER — Ambulatory Visit: Payer: 59 | Admitting: Physical Therapy

## 2018-08-16 ENCOUNTER — Ambulatory Visit: Payer: 59 | Admitting: Physical Therapy

## 2018-08-16 DIAGNOSIS — M545 Low back pain: Secondary | ICD-10-CM | POA: Diagnosis not present

## 2018-08-16 DIAGNOSIS — G8929 Other chronic pain: Secondary | ICD-10-CM | POA: Diagnosis not present

## 2018-08-16 NOTE — Therapy (Signed)
[place], [place], [address] Phone: [phone]   Fax:  [phone]  Physical Therapy Treatment  Patient Details  Name: Jasmine Ortega MRN: [id number] Date of Birth: August 11, 1980 Referring Provider (PT): Jill Alexanders MD   Encounter Date: 08/16/2018  PT End of Session - 08/16/18 1711    Visit Number  3    Number of Visits  12    Date for PT Re-Evaluation  09/06/18    Authorization Type  [employer] employee [employer]    PT Start Time  6387    PT Stop Time  1640    PT Time Calculation (min)  55 min    Activity Tolerance  Patient tolerated treatment well       Past Medical History:  Diagnosis Date  . Birth control   . Chicken pox   . Depression   . GERD (gastroesophageal reflux disease)   . Gestational diabetes     No past surgical history on file.  There were no vitals filed for this visit.  Subjective Assessment - 08/16/18 1709    Subjective  Pt relays the back is improving, the HEP is helping, able to stand at work with less pain.    Currently in Pain?  Yes    Pain Score  1     Pain Location  Back    Pain Orientation  Mid    Pain Descriptors / Indicators  Aching    Pain Type  Chronic pain                       [facility] Adult PT Treatment/Exercise - 08/16/18 0001      Exercises   Exercises  Lumbar      Lumbar Exercises: Stretches   Active Hamstring Stretch  Right;Left;2 reps;30 seconds    Active Hamstring Stretch Limitations  supine with strap    Double Knee to Chest Stretch Limitations  5 sec X 15 reps feet on Pball    Other Lumbar Stretch Exercise  supine hip flexor stretch 30 sec X 3 each supine with strap    Other Lumbar Stretch Exercise  child pose 30 sec X 2      Lumbar Exercises: Aerobic   Nustep  L7 for LE X 5 min      Lumbar Exercises: Standing   Row  20 reps    Theraband Level (Row)  Level 3 (Green)    Shoulder Extension  20 reps    Theraband Level (Shoulder  Extension)  Level 3 (Green)    Other Standing Lumbar Exercises  wall squats on ball hold 5 X 15 sec      Lumbar Exercises: Supine   Bridge  15 reps    Bridge Limitations  feet on P ball      Lumbar Exercises: Quadruped   Opposite Arm/Leg Raise  10 reps      Modalities   Modalities  Electrical Stimulation;Moist Heat      Moist Heat Therapy   Number Minutes Moist Heat  15 Minutes    Moist Heat Location  Lumbar Spine      Electrical Stimulation   Electrical Stimulation Location  lumbar    Electrical Stimulation Action  IFC    Electrical Stimulation Parameters  tolerance, supine with feet elevated    Electrical Stimulation Goals  Pain      Manual Therapy   Manual therapy comments  lumbar rotation mobs 4-5  PT Long Term Goals - 07/26/18 2247      PT LONG TERM GOAL #1   Title  Pt will be I and compliant with HEP. 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     Time  6    Status  New      PT LONG TERM GOAL #3   Title  Pt will improve FOTO to less than 21% limited.     Time  6    Status  New      PT LONG TERM GOAL #4   Title  Pt will improve hip strength to overall 5/5 MMT to improve function.     Time  6    Period  Weeks    Status  New            Plan - 08/16/18 1712    Clinical Impression Statement  Pt is progressing well with PT, able to increase strengthening and core stability today with good tolerance. She relays she feels like her back needs to pop therefore she was asked if she wanted to try lumbar manipulaiton and was given rationale and she gave conset. PT performed SL rotation manip but did not get cavitation so instead prefomed mobs in sidelying grade 4. This was followed by MHP with TENS and she relays back feels amazing after session. PT will continue POC.    Rehab Potential  Good    PT Frequency   2x / week    PT Duration  6 weeks    PT Treatment/Interventions  Cryotherapy;Electrical Stimulation;Iontophoresis 4mg /ml Dexamethasone;Moist Heat;Traction;Ultrasound;Therapeutic activities;Therapeutic exercise;Neuromuscular re-education;Manual techniques;Passive range of motion;Dry needling;Taping;Spinal Manipulations;Joint Manipulations    PT Next Visit Plan  appeared to favor flexion based program, needs core and hip ext strength, what was her response to lumbar mobs last visit.    PT Home Exercise Plan  piriformis and HS stretch, KTC stretch, bridge, prone over pilow hip ext    Consulted and Agree with Plan of Care  Patient       Patient will benefit from skilled therapeutic intervention in order to improve the following deficits and impairments:  Decreased activity tolerance, Difficulty walking, Decreased endurance, Hypomobility, Decreased strength, Impaired flexibility, Increased muscle spasms, Improper body mechanics, Postural dysfunction, Pain  Visit Diagnosis: Chronic bilateral low back pain without sciatica     Problem List Patient Active Problem List   Diagnosis Date Noted  . Gastroesophageal reflux disease 04/27/2017  . Attention deficit hyperactivity disorder (ADHD), predominantly inattentive type 03/05/2015  . Loss of transverse plantar arch of right foot 09/18/2014    Jasmine Ortega 08/16/2018, 5:17 PM  [place], [place], [address] Phone: [phone]   Fax:  [phone]  Name: Jasmine Ortega MRN: [id number] Date of Birth: 1980/08/31

## 2018-08-18 ENCOUNTER — Encounter: Payer: Self-pay | Admitting: Family Medicine

## 2018-08-22 MED FILL — ADDERALL XR 10 MG CAP SA: 10 | 30 days supply | Qty: 30 | Fill #0

## 2018-08-23 ENCOUNTER — Ambulatory Visit: Payer: 59 | Admitting: Physical Therapy

## 2018-08-23 DIAGNOSIS — G8929 Other chronic pain: Secondary | ICD-10-CM | POA: Diagnosis not present

## 2018-08-23 DIAGNOSIS — M545 Low back pain: Principal | ICD-10-CM

## 2018-08-23 NOTE — Therapy (Signed)
[place], [place], [address] Phone: [phone]   Fax:  [phone]  Physical Therapy Treatment  Patient Details  Name: Jasmine Ortega MRN: [id number] Date of Birth: [birth year] Referring Provider (PT): Jill Alexanders MD   Encounter Date: 08/23/2018  PT End of Session - 08/23/18 1654    Visit Number  4    Number of Visits  12    Date for PT Re-Evaluation  09/06/18    Authorization Type  [employer] employee [employer]    PT Start Time  1540    PT Stop Time  1640    PT Time Calculation (min)  60 min    Activity Tolerance  Patient tolerated treatment well    Behavior During Therapy  [facility] for tasks assessed/performed       Past Medical History:  Diagnosis Date  . Birth control   . Chicken pox   . Depression   . GERD (gastroesophageal reflux disease)   . Gestational diabetes     No past surgical history on file.  There were no vitals filed for this visit.  Subjective Assessment - 08/23/18 1607    Subjective  Pt says "its there" when asked about her back pain today.    Patient Stated Goals  decrease pain,     Currently in Pain?  Yes    Pain Score  2     Pain Location  Back    Pain Orientation  Mid    Pain Descriptors / Indicators  Aching                       [facility] Adult PT Treatment/Exercise - 08/23/18 0001      Exercises   Exercises  Lumbar      Lumbar Exercises: Stretches   Active Hamstring Stretch  Right;Left;2 reps;30 seconds    Active Hamstring Stretch Limitations  supine with strap    Double Knee to Chest Stretch Limitations  5 sec X 15 reps feet on Pball    Other Lumbar Stretch Exercise  supine hip flexor stretch 30 sec X 3 each supine with strap      Lumbar Exercises: Aerobic   Nustep  L7 for LE X 5 min   with heat     Lumbar Exercises: Standing   Row  20 reps    Theraband Level (Row)  Level 3 (Green)    Shoulder Extension  20 reps    Theraband Level (Shoulder  Extension)  Level 3 (Green)    Other Standing Lumbar Exercises  wall squats on ball hold 5 X 15 sec    Other Standing Lumbar Exercises  jogging down hall up/down X 3 with cuing for form      Lumbar Exercises: Supine   Bridge Limitations  2 X10 feet on P ball      Lumbar Exercises: Quadruped   Opposite Arm/Leg Raise  10 reps      Modalities   Modalities  Electrical Stimulation;Moist Heat      Moist Heat Therapy   Number Minutes Moist Heat  15 Minutes    Moist Heat Location  Lumbar Spine      Electrical Stimulation   Electrical Stimulation Location  lumbar    Electrical Stimulation Action  IFC    Electrical Stimulation Parameters  tolerance    Electrical Stimulation Goals  Pain             PT Education - 08/23/18 1654  Education Details  running form and shoe recommendations    Person(s) Educated  Patient    Methods  Explanation;Demonstration;Verbal cues;Handout    Comprehension  Verbalized understanding          PT Long Term Goals - 07/26/18 2247      PT LONG TERM GOAL #1   Title  Pt will be I and compliant with HEP. 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     Time  6    Status  New      PT LONG TERM GOAL #3   Title  Pt will improve FOTO to less than 21% limited.     Time  6    Status  New      PT LONG TERM GOAL #4   Title  Pt will improve hip strength to overall 5/5 MMT to improve function.     Time  6    Period  Weeks    Status  New            Plan - 08/23/18 1655    Clinical Impression Statement  Session focused on lumbar and core stabilization and stretching with good tolerance. She is having much less back pain and continues to benefit from PT. She is trying to begin a walk jog program but is having leg pain with this and her form was examined and she was given education to decrease heel strike and to  shorten stride and strike more on mid foot and she was able to show good return demonstration.     Rehab Potential  Good    PT Frequency  2x / week    PT Duration  6 weeks    PT Treatment/Interventions  Cryotherapy;Electrical Stimulation;Iontophoresis 4mg /ml Dexamethasone;Moist Heat;Traction;Ultrasound;Therapeutic activities;Therapeutic exercise;Neuromuscular re-education;Manual techniques;Passive range of motion;Dry needling;Taping;Spinal Manipulations;Joint Manipulations    PT Next Visit Plan  appeared to favor flexion based program, needs core and hip ext strength, what was her response to lumbar mobs last visit.    PT Home Exercise Plan  piriformis and HS stretch, KTC stretch, bridge, prone over pilow hip ext    Consulted and Agree with Plan of Care  Patient       Patient will benefit from skilled therapeutic intervention in order to improve the following deficits and impairments:  Decreased activity tolerance, Difficulty walking, Decreased endurance, Hypomobility, Decreased strength, Impaired flexibility, Increased muscle spasms, Improper body mechanics, Postural dysfunction, Pain  Visit Diagnosis: Chronic bilateral low back pain without sciatica     Problem List Patient Active Problem List   Diagnosis Date Noted  . Gastroesophageal reflux disease 04/27/2017  . Attention deficit hyperactivity disorder (ADHD), predominantly inattentive type 03/05/2015  . Loss of transverse plantar arch of right foot 09/18/2014    Silvestre Mesi 08/23/2018, 4:58 PM  [facility] [address] [place], [place], [address] Phone: [phone]   Fax:  [phone]  Name: Jasmine Ortega MRN: [id number] Date of Birth: 1980-08-07

## 2018-08-30 ENCOUNTER — Ambulatory Visit: Payer: 59 | Attending: Family Medicine | Admitting: Physical Therapy

## 2018-08-30 DIAGNOSIS — M545 Low back pain, unspecified: Secondary | ICD-10-CM

## 2018-08-30 DIAGNOSIS — G8929 Other chronic pain: Secondary | ICD-10-CM | POA: Insufficient documentation

## 2018-08-30 NOTE — Therapy (Addendum)
[place], [place], [address] Phone: [phone]   Fax:  [phone]  Physical Therapy Treatment/Discharge addendum PHYSICAL THERAPY DISCHARGE SUMMARY  Visits from Start of Care: 5  Current functional level related to goals / functional outcomes: See below   Remaining deficits: See below   Education / Equipment: HEP  Plan: Patient agrees to discharge.  Patient goals were partially met. Patient is being discharged due to not returning since the last visit.  ?????   Elsie Ra, PT, DPT 05/08/19 9:17 AM     Patient Details  Name: Jasmine Ortega MRN: [id number] Date of Birth: April 04, 1981 Referring Provider (PT): Jill Alexanders MD   Encounter Date: 08/30/2018  PT End of Session - 08/30/18 1959    Visit Number  5    Number of Visits  12    Date for PT Re-Evaluation  09/06/18    Authorization Type  [employer] employee [employer]    PT Start Time  2542    PT Stop Time  1640    PT Time Calculation (min)  55 min    Activity Tolerance  Patient tolerated treatment well    Behavior During Therapy  [facility] for tasks assessed/performed       Past Medical History:  Diagnosis Date  . Birth control   . Chicken pox   . Depression   . GERD (gastroesophageal reflux disease)   . Gestational diabetes     No past surgical history on file.  There were no vitals filed for this visit.  Subjective Assessment - 08/30/18 1956    Subjective  Pt relays her back feels tight today but overall is doing better    Currently in Pain?  Yes    Pain Score  1     Pain Location  Back    Pain Orientation  Mid    Pain Descriptors / Indicators  Tightness    Pain Type  Chronic pain                       [facility] Adult PT Treatment/Exercise - 08/30/18 0001      Lumbar Exercises: Stretches   Active Hamstring Stretch  Right;Left;2 reps;30 seconds    Active Hamstring Stretch Limitations  supine with strap    Double Knee  to Chest Stretch Limitations  5 sec X 15 reps feet on Pball    Other Lumbar Stretch Exercise  supine hip flexor stretch 30 sec X 3 each supine with strap      Lumbar Exercises: Aerobic   Elliptical  L1 X 5 min      Lumbar Exercises: Standing   Row  20 reps    Theraband Level (Row)  Level 3 (Green)    Shoulder Extension  20 reps    Theraband Level (Shoulder Extension)  Level 3 (Green)    Other Standing Lumbar Exercises  wall squats on ball hold 5 X 15 sec      Lumbar Exercises: Supine   AB Set Limitations  bilat isometic shoulder ext and hip flexion against Pball 5 sec X 15 each leg    Bridge Limitations  2 X10 feet on P ball      Lumbar Exercises: Quadruped   Opposite Arm/Leg Raise  10 reps      Modalities   Modalities  Electrical Stimulation;Moist Heat      Moist Heat Therapy   Number Minutes Moist Heat  15 Minutes  Moist Heat Location  Lumbar Spine      Electrical Stimulation   Electrical Stimulation Location  lumbar    Electrical Stimulation Action  IFC    Electrical Stimulation Parameters  tolerance    Electrical Stimulation Goals  Pain                  PT Long Term Goals - 07/26/18 2247      PT LONG TERM GOAL #1   Title  Pt will be I and compliant with HEP. 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     Time  6    Status  New      PT LONG TERM GOAL #3   Title  Pt will improve FOTO to less than 21% limited.     Time  6    Status  New      PT LONG TERM GOAL #4   Title  Pt will improve hip strength to overall 5/5 MMT to improve function.     Time  6    Period  Weeks    Status  New            Plan - 08/30/18 2030    Clinical Impression Statement  Pt is progressing well with lumbar program but she does still lack core strength and has some muscle tightness in her back. She had good tolerance to program today  without complaints. She will continue to benefit from PT.     Rehab Potential  Good    PT Frequency  2x / week    PT Duration  6 weeks    PT Treatment/Interventions  Cryotherapy;Electrical Stimulation;Iontophoresis 19m/ml Dexamethasone;Moist Heat;Traction;Ultrasound;Therapeutic activities;Therapeutic exercise;Neuromuscular re-education;Manual techniques;Passive range of motion;Dry needling;Taping;Spinal Manipulations;Joint Manipulations    PT Next Visit Plan  appeared to favor flexion based program, needs core and hip ext strength    PT Home Exercise Plan  piriformis and HS stretch, KTC stretch, bridge, prone over pilow hip ext    Consulted and Agree with Plan of Care  Patient       Patient will benefit from skilled therapeutic intervention in order to improve the following deficits and impairments:  Decreased activity tolerance, Difficulty walking, Decreased endurance, Hypomobility, Decreased strength, Impaired flexibility, Increased muscle spasms, Improper body mechanics, Postural dysfunction, Pain  Visit Diagnosis: Chronic bilateral low back pain without sciatica     Problem List Patient Active Problem List   Diagnosis Date Noted  . Gastroesophageal reflux disease 04/27/2017  . Attention deficit hyperactivity disorder (ADHD), predominantly inattentive type 03/05/2015  . Loss of transverse plantar arch of right foot 09/18/2014    BSilvestre Mesi3/09/2018, 8:33 PM  [place] [address]: [phone]  Fax:  [phone] Name: Jasmine LOPATAMRN: [id number] of Birth: 204-29-1982

## 2018-09-06 ENCOUNTER — Ambulatory Visit: Payer: 59 | Admitting: Physical Therapy

## 2018-09-19 MED FILL — ADDERALL XR 10 MG CAP SA: 10 | 30 days supply | Qty: 30 | Fill #0

## 2018-09-25 MED FILL — ESCITALOPRAM 20 MG TABLET: 20 | 90 days supply | Qty: 90 | Fill #2

## 2018-10-20 DIAGNOSIS — F331 Major depressive disorder, recurrent, moderate: Secondary | ICD-10-CM | POA: Diagnosis not present

## 2018-10-26 DIAGNOSIS — F331 Major depressive disorder, recurrent, moderate: Secondary | ICD-10-CM | POA: Diagnosis not present

## 2018-11-02 DIAGNOSIS — F331 Major depressive disorder, recurrent, moderate: Secondary | ICD-10-CM | POA: Diagnosis not present

## 2018-11-09 DIAGNOSIS — F331 Major depressive disorder, recurrent, moderate: Secondary | ICD-10-CM | POA: Diagnosis not present

## 2018-11-22 DIAGNOSIS — H52223 Regular astigmatism, bilateral: Secondary | ICD-10-CM | POA: Diagnosis not present

## 2018-11-22 DIAGNOSIS — H5213 Myopia, bilateral: Secondary | ICD-10-CM | POA: Diagnosis not present

## 2018-11-24 DIAGNOSIS — Z30432 Encounter for removal of intrauterine contraceptive device: Secondary | ICD-10-CM | POA: Diagnosis not present

## 2018-11-24 DIAGNOSIS — F331 Major depressive disorder, recurrent, moderate: Secondary | ICD-10-CM | POA: Diagnosis not present

## 2018-12-04 MED FILL — TINIDAZOLE 500 MG TAB: 500 | 3 days supply | Qty: 12 | Fill #0

## 2018-12-15 DIAGNOSIS — F331 Major depressive disorder, recurrent, moderate: Secondary | ICD-10-CM | POA: Diagnosis not present

## 2018-12-19 ENCOUNTER — Other Ambulatory Visit: Payer: Self-pay

## 2018-12-19 ENCOUNTER — Ambulatory Visit: Payer: 59 | Admitting: Family Medicine

## 2018-12-19 VITALS — BP 104/70 | Ht 64.0 in | Wt 230.0 lb

## 2018-12-19 DIAGNOSIS — M25511 Pain in right shoulder: Secondary | ICD-10-CM | POA: Diagnosis not present

## 2018-12-19 NOTE — Patient Instructions (Signed)
You have rotator cuff impingement Try to avoid painful activities (overhead activities, lifting with extended arm) as much as possible. Aleve 2 tabs twice a day with food OR ibuprofen 3 tabs three times a day with food for pain and inflammation as needed. Can take tylenol in addition to this. Subacromial injection may be beneficial to help with pain and to decrease inflammation. Consider physical therapy with transition to home exercise program. If not improving at follow-up we will consider further imaging, injection, physical therapy, and/or nitro patches. Follow up with me as needed.

## 2018-12-20 ENCOUNTER — Encounter: Payer: Self-pay | Admitting: Family Medicine

## 2018-12-20 NOTE — Progress Notes (Signed)
PCP: Denita Lung, MD  Subjective:   HPI: Patient is a 38 y.o. female here for right shoulder pain.  Patient reports that she has had right shoulder pain laterally since last Wednesday. She states her right shoulder pops regularly and has done so since she was involved in swimming in high school. She would regularly swim backstroke and butterfly and previously had problems with her rotator cuff and instability. She recalls picking up her suitcase in the middle of last week and developed some pain in the lateral aspect of the shoulder, trapezius, and into her arm and chest. Has tried ibuprofen and Tylenol, icy hot, and heat. Pain was a 9 out of 10 but it is improved significantly down to 1 out of 10 level now. No skin changes or numbness.  Past Medical History:  Diagnosis Date  . Birth control   . Chicken pox   . Depression   . GERD (gastroesophageal reflux disease)   . Gestational diabetes     Current Outpatient Medications on File Prior to Visit  Medication Sig Dispense Refill  . amphetamine-dextroamphetamine (ADDERALL XR) 10 MG 24 hr capsule Take 1 capsule (10 mg total) by mouth daily. 30 capsule 0  . calcium carbonate (TUMS - DOSED IN MG ELEMENTAL CALCIUM) 500 MG chewable tablet Chew 1 tablet by mouth 3 (three) times daily as needed for indigestion or heartburn.    . docusate sodium (COLACE) 100 MG capsule Take 100 mg by mouth daily as needed for mild constipation.    Marland Kitchen escitalopram (LEXAPRO) 20 MG tablet daily.     Marland Kitchen levonorgestrel (MIRENA, 52 MG,) 20 MCG/24HR IUD Mirena 20 mcg/24 hours (5 yrs) 52 mg intrauterine device  Take 1 device by intrauterine route.    Marland Kitchen omeprazole (PRILOSEC) 20 MG capsule Take 20 mg by mouth daily.    . Prenatal Vit-Fe Fumarate-FA (PRENATAL VITAMIN PO) Take 1 tablet by mouth daily.      No current facility-administered medications on file prior to visit.     History reviewed. No pertinent surgical history.  Allergies  Allergen Reactions  .  Meloxicam Anaphylaxis    Also hives    Social History   Socioeconomic History  . Marital status: Married    Spouse name: Not on file  . Number of children: Not on file  . Years of education: Not on file  . Highest education level: Not on file  Occupational History  . Not on file  Social Needs  . Financial resource strain: Not on file  . Food insecurity    Worry: Not on file    Inability: Not on file  . Transportation needs    Medical: Not on file    Non-medical: Not on file  Tobacco Use  . Smoking status: Never Smoker  . Smokeless tobacco: Never Used  Substance and Sexual Activity  . Alcohol use: Yes    Alcohol/week: 2.0 standard drinks    Types: 2 Glasses of wine per week  . Drug use: No  . Sexual activity: Yes  Lifestyle  . Physical activity    Days per week: Not on file    Minutes per session: Not on file  . Stress: Not on file  Relationships  . Social Herbalist on phone: Not on file    Gets together: Not on file    Attends religious service: Not on file    Active member of club or organization: Not on file    Attends meetings of  clubs or organizations: Not on file    Relationship status: Not on file  . Intimate partner violence    Fear of current or ex partner: Not on file    Emotionally abused: Not on file    Physically abused: Not on file    Forced sexual activity: Not on file  Other Topics Concern  . Not on file  Social History Narrative  . Not on file    Family History  Problem Relation Age of Onset  . Arthritis Father   . Hyperlipidemia Father   . Hypertension Father   . Arthritis Paternal Grandmother   . Heart disease Paternal Grandmother   . Breast cancer Mother   . Cancer Mother 24       Breast cancer  . Miscarriages / Stillbirths Maternal Grandfather   . Diabetes Paternal Grandfather     BP 104/70   Ht 5\' 4"  (1.626 m)   Wt 230 lb (104.3 kg)   BMI 39.48 kg/m   Review of Systems: See HPI above.     Objective:   Physical Exam:  Gen: NAD, comfortable in exam room  Right shoulder: No swelling, ecchymoses.  No gross deformity. No TTP. FROM with mild painful arc. Negative Hawkins, Neers. Negative Yergasons. Strength 5/5 with empty can and resisted internal/external rotation. Negative apprehension. NV intact distally.  Left shoulder: No swelling, ecchymoses.  No gross deformity. No TTP. FROM. Strength 5/5 with empty can and resisted internal/external rotation. NV intact distally.   Assessment & Plan:  1. Right shoulder pain -secondary to rotator cuff impingement is improving.  We discussed avoiding reaching overhead motions.  Aleve or ibuprofen as needed.  Consider subacromial injection, ultrasound, physical therapy, home exercise program, nitro patches if not improving.  Follow-up with me as needed.

## 2019-01-08 MED FILL — ADDERALL XR 10 MG CAP SA: 10 | 30 days supply | Qty: 30 | Fill #0

## 2019-03-11 ENCOUNTER — Encounter: Payer: Self-pay | Admitting: Family Medicine

## 2019-03-15 ENCOUNTER — Other Ambulatory Visit: Payer: Self-pay | Admitting: Occupational Medicine

## 2019-03-17 LAB — COMPLETE METABOLIC PANEL WITH GFR
AG Ratio: 1.6 (calc) (ref 1.0–2.5)
ALT: 17 U/L
AST: 16 U/L (ref 10–40)
Albumin: 4.3 g/dL (ref 3.6–5.1)
Alkaline phosphatase (APISO): 50 U/L
BUN: 14 mg/dL (ref 7–25)
CO2: 22 mmol/L (ref 20–32)
Calcium: 8.9 mg/dL
Chloride: 103 mmol/L (ref 98–110)
Creat: 0.79 mg/dL
Globulin: 2.7 g/dL (calc) (ref 1.9–3.7)
Glucose, Bld: 83 mg/dL (ref 65–99)
Potassium: 4.2 mmol/L (ref 3.5–5.3)
Sodium: 138 mmol/L (ref 135–146)
Total Bilirubin: 0.6 mg/dL (ref 0.2–1.2)
Total Protein: 7 g/dL (ref 6.1–8.1)

## 2019-03-17 LAB — CBC WITH DIFFERENTIAL/PLATELET
Absolute Monocytes: 554 cells/uL (ref 200–950)
Basophils Absolute: 70 cells/uL (ref 0–200)
Basophils Relative: 0.9 %
Eosinophils Absolute: 234 cells/uL (ref 15–500)
Eosinophils Relative: 3 %
HCT: 38.6 % (ref 38.5–45.0)
Hemoglobin: 13 g/dL — ABNORMAL LOW (ref 13.2–15.5)
Lymphs Abs: 3128 cells/uL (ref 850–3900)
MCH: 29 pg (ref 27.0–33.0)
MCHC: 33.7 g/dL (ref 32.0–36.0)
MCV: 86 fL (ref 80.0–100.0)
MPV: 11.5 fL (ref 7.5–12.5)
Monocytes Relative: 7.1 %
Neutro Abs: 3814 cells/uL (ref 1500–7800)
Neutrophils Relative %: 48.9 %
Platelets: 246 10*3/uL (ref 140–400)
RBC: 4.49 10*6/uL (ref 4.20–5.10)
RDW: 13.1 % (ref 11.0–15.0)
Total Lymphocyte: 40.1 %
WBC: 7.8 10*3/uL (ref 3.8–10.8)

## 2019-03-17 LAB — LIPID PANEL
Cholesterol: 211 mg/dL — ABNORMAL HIGH (ref <200)
HDL: 61 mg/dL
LDL Cholesterol (Calc): 122 mg/dL (calc) — ABNORMAL HIGH
Non-HDL Cholesterol (Calc): 150 mg/dL (calc) — ABNORMAL HIGH (ref <130)
Total CHOL/HDL Ratio: 3.5 (calc) (ref <5.0)
Triglycerides: 161 mg/dL — ABNORMAL HIGH (ref <150)

## 2019-03-17 LAB — SPECIMEN ID NOTIFICATION MISSING 2ND ID

## 2019-03-17 LAB — TSH: TSH: 2.31 mIU/L (ref 0.40–4.50)

## 2019-04-09 DIAGNOSIS — N926 Irregular menstruation, unspecified: Secondary | ICD-10-CM | POA: Diagnosis not present

## 2019-04-19 ENCOUNTER — Encounter: Payer: Self-pay | Admitting: Family Medicine

## 2019-04-19 DIAGNOSIS — F900 Attention-deficit hyperactivity disorder, predominantly inattentive type: Secondary | ICD-10-CM

## 2019-04-20 DIAGNOSIS — Z124 Encounter for screening for malignant neoplasm of cervix: Secondary | ICD-10-CM | POA: Diagnosis not present

## 2019-04-20 DIAGNOSIS — Z01419 Encounter for gynecological examination (general) (routine) without abnormal findings: Secondary | ICD-10-CM | POA: Diagnosis not present

## 2019-04-20 LAB — HM PAP SMEAR

## 2019-04-20 LAB — RESULTS CONSOLE HPV: CHL HPV: NEGATIVE

## 2019-04-20 MED ORDER — AMPHETAMINE-DEXTROAMPHET ER 15 MG PO CP24: 15.0000 mg | ORAL_CAPSULE | ORAL | 0 refills | Status: DC

## 2019-04-20 MED FILL — ADDERALL XR 15 MG CAP SA: 15 | 30 days supply | Qty: 30 | Fill #0

## 2019-05-15 ENCOUNTER — Encounter: Payer: Self-pay | Admitting: Family Medicine

## 2019-05-15 DIAGNOSIS — F900 Attention-deficit hyperactivity disorder, predominantly inattentive type: Secondary | ICD-10-CM

## 2019-05-15 MED ORDER — AMPHETAMINE-DEXTROAMPHET ER 15 MG PO CP24: 15.0000 mg | ORAL_CAPSULE | ORAL | 0 refills | Status: DC

## 2019-05-31 DIAGNOSIS — Z30430 Encounter for insertion of intrauterine contraceptive device: Secondary | ICD-10-CM | POA: Diagnosis not present

## 2019-05-31 DIAGNOSIS — Z3202 Encounter for pregnancy test, result negative: Secondary | ICD-10-CM | POA: Diagnosis not present

## 2019-06-05 MED FILL — ADDERALL XR 15 MG CAP SA: 15 | 30 days supply | Qty: 30 | Fill #0

## 2019-07-06 DIAGNOSIS — F331 Major depressive disorder, recurrent, moderate: Secondary | ICD-10-CM | POA: Diagnosis not present

## 2019-08-27 MED FILL — ADDERALL XR 15 MG CAP SA: 15 | 30 days supply | Qty: 30 | Fill #0

## 2019-08-29 ENCOUNTER — Encounter: Payer: Self-pay | Admitting: Family Medicine

## 2019-09-14 ENCOUNTER — Encounter: Payer: Self-pay | Admitting: Family Medicine

## 2019-09-14 ENCOUNTER — Other Ambulatory Visit: Payer: Self-pay

## 2019-09-14 ENCOUNTER — Ambulatory Visit (INDEPENDENT_AMBULATORY_CARE_PROVIDER_SITE_OTHER): Payer: 59 | Admitting: Family Medicine

## 2019-09-14 VITALS — BP 104/68 | HR 84 | Temp 97.3°F | Ht 64.5 in | Wt 223.2 lb

## 2019-09-14 DIAGNOSIS — E785 Hyperlipidemia, unspecified: Secondary | ICD-10-CM | POA: Insufficient documentation

## 2019-09-14 DIAGNOSIS — E669 Obesity, unspecified: Secondary | ICD-10-CM | POA: Diagnosis not present

## 2019-09-14 DIAGNOSIS — Z0000 Encounter for general adult medical examination without abnormal findings: Secondary | ICD-10-CM | POA: Diagnosis not present

## 2019-09-14 DIAGNOSIS — K219 Gastro-esophageal reflux disease without esophagitis: Secondary | ICD-10-CM | POA: Diagnosis not present

## 2019-09-14 DIAGNOSIS — F900 Attention-deficit hyperactivity disorder, predominantly inattentive type: Secondary | ICD-10-CM

## 2019-09-14 LAB — POCT URINALYSIS DIP (PROADVANTAGE DEVICE)
Bilirubin, UA: NEGATIVE
Glucose, UA: NEGATIVE mg/dL
Ketones, POC UA: NEGATIVE mg/dL
Nitrite, UA: NEGATIVE
Protein Ur, POC: NEGATIVE mg/dL
Specific Gravity, Urine: 1.015
Urobilinogen, Ur: 0.2
pH, UA: 6 (ref 5.0–8.0)

## 2019-09-14 NOTE — Progress Notes (Signed)
   Subjective:    Patient ID: Jasmine Ortega, female    DOB: 08/19/1980, 39 y.o.   MRN: [id number]  HPI She is here for complete examination.  She did stop taking her Lexapro that was given to her by her gynecologist for postpartum anxiety symptoms.  She is on Mirena and has had some recent difficulty with menorrhagia.  She seems to be doing well off the medication.  She does have reflux symptoms and does use Prilosec on a as needed basis.  Her ADD is under good control on Adderall.  15 mg pills usually last around 8 hours.  She has no problems coming off other than becoming more distracted.  She does see her gynecologist regularly.  She does have difficulty with bruxism and is concerned about the neck and shoulder discomfort because of that.  She has tried various weight loss programs in the past with mixed success.   Review of Systems  All other systems reviewed and are negative.      Objective:   Physical Exam Alert and in no distress. Tympanic membranes and canals are normal. Pharyngeal area is normal. Neck is supple without adenopathy or thyromegaly. Cardiac exam shows a regular sinus rhythm without murmurs or gallops. Lungs are clear to auscultation.       Assessment & Plan:  Routine medical exam - Plan: POCT Urinalysis DIP (Proadvantage Device)  Attention deficit hyperactivity disorder (ADHD), predominantly inattentive type  Hyperlipidemia, unspecified hyperlipidemia type  Gastroesophageal reflux disease, unspecified whether esophagitis present  Obesity (BMI 30-39.9) I will continue her on her present Adderall dosing.  Discussed weight loss with her and will refer to [facility].discussed the bruxism with her and do not think it is related to the underlying cardiopulmonary issues.  Of note is she has had her Covid shot.

## 2019-09-17 ENCOUNTER — Other Ambulatory Visit: Payer: Self-pay | Admitting: Internal Medicine

## 2019-09-17 DIAGNOSIS — R319 Hematuria, unspecified: Secondary | ICD-10-CM | POA: Diagnosis not present

## 2019-09-17 DIAGNOSIS — Z30431 Encounter for routine checking of intrauterine contraceptive device: Secondary | ICD-10-CM | POA: Diagnosis not present

## 2019-09-18 ENCOUNTER — Encounter: Payer: Self-pay | Admitting: Family Medicine

## 2019-09-18 LAB — URINE CULTURE
MICRO NUMBER:: 10277032
Result:: NO GROWTH
SPECIMEN QUALITY:: ADEQUATE

## 2019-09-19 ENCOUNTER — Encounter: Payer: Self-pay | Admitting: Family Medicine

## 2019-09-19 ENCOUNTER — Other Ambulatory Visit: Payer: Self-pay

## 2019-09-19 DIAGNOSIS — R319 Hematuria, unspecified: Secondary | ICD-10-CM

## 2019-10-05 ENCOUNTER — Encounter: Payer: Self-pay | Admitting: Family Medicine

## 2019-10-05 MED ORDER — AMPHETAMINE-DEXTROAMPHETAMINE 12.5 MG PO TABS: 12.5000 mg | ORAL_TABLET | Freq: Every day | ORAL | 0 refills | Status: DC

## 2019-10-05 MED FILL — AMPHETAMINE-DEXTROAMPHETAMI: 12.5 | 30 days supply | Qty: 30 | Fill #0

## 2019-10-24 DIAGNOSIS — R310 Gross hematuria: Secondary | ICD-10-CM | POA: Diagnosis not present

## 2019-10-24 DIAGNOSIS — R8271 Bacteriuria: Secondary | ICD-10-CM | POA: Diagnosis not present

## 2019-11-07 DIAGNOSIS — R310 Gross hematuria: Secondary | ICD-10-CM | POA: Diagnosis not present

## 2019-11-07 DIAGNOSIS — D1803 Hemangioma of intra-abdominal structures: Secondary | ICD-10-CM | POA: Diagnosis not present

## 2019-11-13 DIAGNOSIS — R310 Gross hematuria: Secondary | ICD-10-CM | POA: Diagnosis not present

## 2019-11-14 ENCOUNTER — Encounter: Payer: Self-pay | Admitting: Skilled Nursing Facility1

## 2019-11-14 ENCOUNTER — Encounter: Payer: Self-pay | Admitting: Family Medicine

## 2019-11-14 ENCOUNTER — Other Ambulatory Visit: Payer: Self-pay

## 2019-11-14 ENCOUNTER — Encounter: Payer: 59 | Attending: Family Medicine | Admitting: Skilled Nursing Facility1

## 2019-11-14 DIAGNOSIS — F900 Attention-deficit hyperactivity disorder, predominantly inattentive type: Secondary | ICD-10-CM

## 2019-11-14 DIAGNOSIS — E631 Imbalance of constituents of food intake: Secondary | ICD-10-CM | POA: Insufficient documentation

## 2019-11-14 NOTE — Progress Notes (Signed)
  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   Pt states she is 215 pounds and is trying to lose weight. Pt her macro distribution is 20% carb, 40 % protein and 40% fat and 1830 calories. Pt state she is feeling very frustrated with not losing weight fast enough and cannot stick to that distribution. Pt states she has no time to workout so that is not an option. Pt states she is an emotional eater. Pt states she did have blood in her urine but that is resolved.  Pt states she overeats at night and picks while she cooks. Pt states she has identified the emotions she experiences with emotional eating. Pt state she has worked with a Transport planner on emotional eating which she graduated from.   MEDICATIONS: see list   DIETARY INTAKE:  Usual eating pattern includes 3 meals and 1 snacks per day.  Everyday foods include none stated.  Avoided foods include none stated    24-hr recall: emotional eating 5 days a week B ( AM): corn tortilla with 2 eggs and bacon and cheese Snk ( AM):  L ( PM): chicken + quinoa + broccoli/brussles Snk ( PM): whole milk yogurt D ( PM): chicken enchilada casserole with cheese (picking on foods while cooking) Snk ( PM): cheese or choc protein shake Beverages: water, coffee + protein + monk fruit + whole milk; 30-90 ounces water, hot tea + honey, alcohol 2-3 a month  Usual physical activity: ADL's     Intervention:  Goals: Limit cheese Choose low fat yogurt Let weight go and focus on emotional eating: possibly reach out to your therapist again  Teaching Method Utilized:  Visual Auditory Hands on  Handouts given during visit include:  Detailed myplate  Barriers to learning/adherence to lifestyle change: emotional eating  Demonstrated degree of understanding via:  Teach Back   Monitoring/Evaluation:  Dietary intake, exercise, and body weight prn.

## 2019-11-15 MED ORDER — METHYLPHENIDATE HCL ER 10 MG PO TBCR: 10.0000 mg | EXTENDED_RELEASE_TABLET | Freq: Every day | ORAL | 0 refills | Status: DC

## 2019-11-15 MED FILL — METHYLPHENIDATE HCL ER 10 M: 10 | 30 days supply | Qty: 30 | Fill #0

## 2019-11-15 NOTE — Telephone Encounter (Signed)
Apparently the Adderall 10 mg ER does help but not long enough.  She will take that and then break in half the 12.5 mg Adderall later in the day.  She will keep me informed as to how this is doing.  I discussed the fact that we have a 7-1/2 and a 10 mg strength that can also be used.

## 2019-11-28 ENCOUNTER — Encounter: Payer: 59 | Attending: Family Medicine | Admitting: Skilled Nursing Facility1

## 2019-11-28 ENCOUNTER — Ambulatory Visit: Payer: 59 | Admitting: Skilled Nursing Facility1

## 2019-11-28 DIAGNOSIS — E631 Imbalance of constituents of food intake: Secondary | ICD-10-CM | POA: Insufficient documentation

## 2019-11-28 DIAGNOSIS — E639 Nutritional deficiency, unspecified: Secondary | ICD-10-CM

## 2019-11-28 NOTE — Progress Notes (Signed)
  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   Pt states she tweaked her macronutrient distribution but cannot remember what she tweaked it to. Stating this tweak has helped with emotional eating. Pt states she is trying to cur back on her cheese intake. Pt states she has nothing else she would like to discuss as far as making changes.   MEDICATIONS: see list   DIETARY INTAKE:  Usual eating pattern includes 3 meals and 1 snacks per day.  Everyday foods include none stated.  Avoided foods include none stated    24-hr recall: emotional eating 3 days a week B ( AM): flour tortilla with 2 eggs and bacon and cheese Snk ( AM):  L ( PM): chicken + quinoa + broccoli/brussles Snk ( PM): whole milk yogurt D ( PM): chicken enchilada casserole with cheese (picking on foods while cooking) Snk ( PM): cheese or choc protein shake and chcolate chips Beverages: water, coffee + collagan + monk fruit + whole milk; 30-90 ounces water, hot tea + honey, alcohol 2-3 a month  Usual physical activity: ADL's    Teaching Method Utilized:  Visual Auditory Hands on  Handouts given during visit include:  Detailed myplate  Barriers to learning/adherence to lifestyle change: emotional eating  Demonstrated degree of understanding via:  Teach Back   Monitoring/Evaluation:  Dietary intake, exercise, and body weight prn.

## 2019-12-12 DIAGNOSIS — H52223 Regular astigmatism, bilateral: Secondary | ICD-10-CM | POA: Diagnosis not present

## 2019-12-12 DIAGNOSIS — H5213 Myopia, bilateral: Secondary | ICD-10-CM | POA: Diagnosis not present

## 2019-12-19 DIAGNOSIS — D23122 Other benign neoplasm of skin of left lower eyelid, including canthus: Secondary | ICD-10-CM | POA: Diagnosis not present

## 2020-01-02 ENCOUNTER — Encounter: Payer: 59 | Attending: Family Medicine | Admitting: Skilled Nursing Facility1

## 2020-01-02 ENCOUNTER — Other Ambulatory Visit: Payer: Self-pay

## 2020-01-02 DIAGNOSIS — E631 Imbalance of constituents of food intake: Secondary | ICD-10-CM | POA: Insufficient documentation

## 2020-01-02 DIAGNOSIS — E669 Obesity, unspecified: Secondary | ICD-10-CM

## 2020-01-02 NOTE — Progress Notes (Signed)
  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   Pt states she is getting to the point she can tell herself it is okay when she does emotionally eat. Pt states she has nothing else she would like to discuss as far as making changes.     MEDICATIONS: see list   DIETARY INTAKE:  Usual eating pattern includes 3 meals and 1 snacks per day.  Everyday foods include none stated.  Avoided foods include none stated    24-hr recall: emotional eating 3 days a week B ( AM): flour tortilla with 2 eggs and bacon and cheese Snk ( AM):  L ( PM): chicken + quinoa + broccoli/brussles Snk ( PM): whole milk yogurt D ( PM): chicken enchilada casserole with cheese (picking on foods while cooking) Snk ( PM): cheese or choc protein shake and chcolate chips Beverages: water, coffee + collagan + monk fruit + whole milk; 30-90 ounces water, hot tea + honey, alcohol 2-3 a month  Usual physical activity: ADL's    Teaching Method Utilized:  Visual Auditory Hands on  Handouts given during visit include:  Detailed myplate  Barriers to learning/adherence to lifestyle change: emotional eating  Demonstrated degree of understanding via:  Teach Back   Monitoring/Evaluation:  Dietary intake, exercise, and body weight prn.

## 2020-01-09 MED FILL — ESTRADIOL 1 MG TABLET: 1 | 14 days supply | Qty: 14 | Fill #0

## 2020-02-12 ENCOUNTER — Encounter: Payer: Self-pay | Admitting: Family Medicine

## 2020-02-12 DIAGNOSIS — F900 Attention-deficit hyperactivity disorder, predominantly inattentive type: Secondary | ICD-10-CM

## 2020-02-12 MED ORDER — AMPHETAMINE-DEXTROAMPHETAMINE 12.5 MG PO TABS: 12.5000 mg | ORAL_TABLET | Freq: Every day | ORAL | 0 refills | Status: DC

## 2020-02-13 MED ORDER — METHYLPHENIDATE HCL ER 10 MG PO TBCR: 10.0000 mg | EXTENDED_RELEASE_TABLET | Freq: Every day | ORAL | 0 refills | Status: DC

## 2020-02-13 MED FILL — METHYLPHENIDATE HCL ER 10 M: 10 | 30 days supply | Qty: 30 | Fill #0

## 2020-02-13 NOTE — Addendum Note (Signed)
Addended by: Denita Lung on: 02/13/2020 08:37 AM   Modules accepted: Orders

## 2020-02-20 ENCOUNTER — Encounter: Payer: Self-pay | Admitting: Family Medicine

## 2020-02-29 ENCOUNTER — Other Ambulatory Visit (HOSPITAL_COMMUNITY): Payer: Self-pay | Admitting: Obstetrics and Gynecology

## 2020-02-29 DIAGNOSIS — Z3202 Encounter for pregnancy test, result negative: Secondary | ICD-10-CM | POA: Diagnosis not present

## 2020-02-29 DIAGNOSIS — N859 Noninflammatory disorder of uterus, unspecified: Secondary | ICD-10-CM | POA: Diagnosis not present

## 2020-02-29 DIAGNOSIS — N939 Abnormal uterine and vaginal bleeding, unspecified: Secondary | ICD-10-CM | POA: Diagnosis not present

## 2020-02-29 MED FILL — ESTRADIOL 0.1 MG/DAY PATCH: 0.1 | 28 days supply | Qty: 4 | Fill #0

## 2020-03-28 ENCOUNTER — Ambulatory Visit (INDEPENDENT_AMBULATORY_CARE_PROVIDER_SITE_OTHER): Payer: 59

## 2020-03-28 ENCOUNTER — Other Ambulatory Visit: Payer: Self-pay

## 2020-03-28 DIAGNOSIS — Z23 Encounter for immunization: Secondary | ICD-10-CM | POA: Diagnosis not present

## 2020-04-02 MED FILL — ESTRADIOL 0.1 MG/DAY PATCH: 0.1 | 28 days supply | Qty: 4 | Fill #1

## 2020-04-10 ENCOUNTER — Encounter: Payer: Self-pay | Admitting: Family Medicine

## 2020-04-11 ENCOUNTER — Telehealth: Payer: Self-pay

## 2020-04-11 NOTE — Telephone Encounter (Signed)
Pt was called to find out if she has a eye doctor. Pt stated she did and will try to see them today. Pt will try to get an appointment today and if she can't she will call our office back for either help or an appointment with [place] this afternoon. [place]

## 2020-04-24 MED FILL — ESTRADIOL 0.1 MG/DAY PATCH: 0.1 | 28 days supply | Qty: 4 | Fill #2

## 2020-05-15 ENCOUNTER — Other Ambulatory Visit: Payer: Self-pay | Admitting: Family Medicine

## 2020-05-15 ENCOUNTER — Encounter: Payer: Self-pay | Admitting: Family Medicine

## 2020-05-15 DIAGNOSIS — F900 Attention-deficit hyperactivity disorder, predominantly inattentive type: Secondary | ICD-10-CM

## 2020-05-15 MED ORDER — METHYLPHENIDATE HCL ER 10 MG PO TBCR: 10.0000 mg | EXTENDED_RELEASE_TABLET | Freq: Every day | ORAL | 0 refills | Status: DC

## 2020-05-15 MED FILL — METHYLPHENIDATE HCL ER 10 M: 10 | 90 days supply | Qty: 90 | Fill #0

## 2020-05-15 NOTE — Telephone Encounter (Signed)
Jasmine Ortega is requesting to fill pt methylphenidate. Please advise [facility]

## 2020-06-04 DIAGNOSIS — Z01419 Encounter for gynecological examination (general) (routine) without abnormal findings: Secondary | ICD-10-CM | POA: Diagnosis not present

## 2020-06-04 DIAGNOSIS — Z1231 Encounter for screening mammogram for malignant neoplasm of breast: Secondary | ICD-10-CM | POA: Diagnosis not present

## 2020-09-01 ENCOUNTER — Encounter: Payer: Self-pay | Admitting: Family Medicine

## 2020-09-01 DIAGNOSIS — Z0000 Encounter for general adult medical examination without abnormal findings: Secondary | ICD-10-CM

## 2020-09-01 DIAGNOSIS — E669 Obesity, unspecified: Secondary | ICD-10-CM

## 2020-09-01 DIAGNOSIS — E785 Hyperlipidemia, unspecified: Secondary | ICD-10-CM

## 2020-09-08 ENCOUNTER — Other Ambulatory Visit: Payer: Self-pay | Admitting: Internal Medicine

## 2020-09-08 DIAGNOSIS — E669 Obesity, unspecified: Secondary | ICD-10-CM | POA: Diagnosis not present

## 2020-09-08 DIAGNOSIS — Z0000 Encounter for general adult medical examination without abnormal findings: Secondary | ICD-10-CM

## 2020-09-08 DIAGNOSIS — E785 Hyperlipidemia, unspecified: Secondary | ICD-10-CM

## 2020-09-08 NOTE — Addendum Note (Signed)
Addended by: Minette Headland A on: 09/08/2020 01:44 PM   Modules accepted: Orders

## 2020-09-09 LAB — CBC WITH DIFFERENTIAL/PLATELET
Absolute Monocytes: 638 cells/uL (ref 200–950)
Basophils Absolute: 53 cells/uL (ref 0–200)
Basophils Relative: 0.7 %
Eosinophils Absolute: 137 cells/uL (ref 15–500)
Eosinophils Relative: 1.8 %
HCT: 40.1 % (ref 35.0–45.0)
Hemoglobin: 13.1 g/dL (ref 11.7–15.5)
Lymphs Abs: 2060 cells/uL (ref 850–3900)
MCH: 28.6 pg (ref 27.0–33.0)
MCHC: 32.7 g/dL (ref 32.0–36.0)
MCV: 87.6 fL (ref 80.0–100.0)
MPV: 11 fL (ref 7.5–12.5)
Monocytes Relative: 8.4 %
Neutro Abs: 4712 cells/uL (ref 1500–7800)
Neutrophils Relative %: 62 %
Platelets: 224 10*3/uL (ref 140–400)
RBC: 4.58 10*6/uL (ref 3.80–5.10)
RDW: 13 % (ref 11.0–15.0)
Total Lymphocyte: 27.1 %
WBC: 7.6 10*3/uL (ref 3.8–10.8)

## 2020-09-09 LAB — COMPREHENSIVE METABOLIC PANEL
AG Ratio: 1.7 (calc) (ref 1.0–2.5)
ALT: 18 U/L (ref 6–29)
AST: 14 U/L (ref 10–30)
Albumin: 4.2 g/dL (ref 3.6–5.1)
Alkaline phosphatase (APISO): 68 U/L (ref 31–125)
BUN: 17 mg/dL (ref 7–25)
CO2: 28 mmol/L (ref 20–32)
Calcium: 9.1 mg/dL (ref 8.6–10.2)
Chloride: 103 mmol/L (ref 98–110)
Creat: 0.73 mg/dL (ref 0.50–1.10)
Globulin: 2.5 g/dL (calc) (ref 1.9–3.7)
Glucose, Bld: 90 mg/dL (ref 65–99)
Potassium: 4.3 mmol/L (ref 3.5–5.3)
Sodium: 139 mmol/L (ref 135–146)
Total Bilirubin: 0.5 mg/dL (ref 0.2–1.2)
Total Protein: 6.7 g/dL (ref 6.1–8.1)

## 2020-09-09 LAB — LIPID PANEL
Cholesterol: 186 mg/dL (ref <200)
HDL: 60 mg/dL (ref >=50)
LDL Cholesterol (Calc): 110 mg/dL (calc) — ABNORMAL HIGH
Non-HDL Cholesterol (Calc): 126 mg/dL (calc) (ref <130)
Total CHOL/HDL Ratio: 3.1 (calc) (ref <5.0)
Triglycerides: 75 mg/dL (ref <150)

## 2020-09-17 ENCOUNTER — Ambulatory Visit (INDEPENDENT_AMBULATORY_CARE_PROVIDER_SITE_OTHER): Payer: 59 | Admitting: Family Medicine

## 2020-09-17 ENCOUNTER — Other Ambulatory Visit: Payer: Self-pay

## 2020-09-17 ENCOUNTER — Other Ambulatory Visit: Payer: Self-pay | Admitting: Family Medicine

## 2020-09-17 ENCOUNTER — Encounter: Payer: Self-pay | Admitting: Family Medicine

## 2020-09-17 VITALS — BP 112/72 | HR 68 | Temp 98.6°F | Ht 64.0 in | Wt 202.6 lb

## 2020-09-17 DIAGNOSIS — Z0000 Encounter for general adult medical examination without abnormal findings: Secondary | ICD-10-CM | POA: Diagnosis not present

## 2020-09-17 DIAGNOSIS — E785 Hyperlipidemia, unspecified: Secondary | ICD-10-CM

## 2020-09-17 DIAGNOSIS — E669 Obesity, unspecified: Secondary | ICD-10-CM | POA: Diagnosis not present

## 2020-09-17 DIAGNOSIS — K219 Gastro-esophageal reflux disease without esophagitis: Secondary | ICD-10-CM

## 2020-09-17 DIAGNOSIS — F900 Attention-deficit hyperactivity disorder, predominantly inattentive type: Secondary | ICD-10-CM

## 2020-09-17 MED ORDER — METHYLPHENIDATE HCL ER 10 MG PO TBCR: 10.0000 mg | EXTENDED_RELEASE_TABLET | Freq: Every day | ORAL | 0 refills | Status: DC

## 2020-09-17 MED FILL — METHYLPHENIDATE HCL ER 10 M: 10 | 90 days supply | Qty: 90 | Fill #0

## 2020-09-17 NOTE — Progress Notes (Signed)
   Subjective:    Patient ID: Jasmine Ortega, female    DOB: 17-Apr-1981, 40 y.o.   MRN: [id number]  HPI  She is here fo complete examination.rshe did use [employer] as a weight loss program but found it to not be as useful as she would like due to the cold-like nature of it.  Since then she has been doing fairly good job of cutting back on carbohydrates and has lost a total of 20 pounds.  She does have underlying ADD and states that her medicine lasts roughly 6 hours which gives her through the bulk of her day.  She is comfortable with that.  She does have reflux disease and occasionally will use a PPI.  She sees her gynecologist regularly.  Her work and home life are going well.  Family and social history as well as health maintenance and immunizations was reviewed  Review of Systems  All other systems reviewed and are negative.      Objective:   Physical Exam Alert and in no distress. Tympanic membranes and canals are normal. Pharyngeal area is normal. Neck is supple without adenopathy or thyromegaly. Cardiac exam shows a regular sinus rhythm without murmurs or gallops. Lungs are clear to auscultation.  Abdominal exam shows no masses or tenderness with normal bowel sounds The lab data was reviewed with her.      Assessment & Plan:  Gastroesophageal reflux disease, unspecified whether esophagitis present  Attention deficit hyperactivity disorder (ADHD), predominantly inattentive type - Plan: methylphenidate 10 MG ER tablet  Obesity (BMI 30-39.9)  Hyperlipidemia, unspecified hyperlipidemia type I congratulated her on her weight loss.  Discussed possibly setting a pant or dress size rather than a weight is her goal.  She will continue on her ADD medicine which she is taking fairly regularly.  Discussed colon cancer screening at age 35.  She will continue to be followed by gynecology.

## 2020-11-14 ENCOUNTER — Other Ambulatory Visit (HOSPITAL_COMMUNITY): Payer: Self-pay

## 2020-11-14 ENCOUNTER — Encounter: Payer: Self-pay | Admitting: Family Medicine

## 2020-11-14 MED ORDER — CYCLOBENZAPRINE HCL 10 MG PO TABS
10.0000 mg | ORAL_TABLET | Freq: Three times a day (TID) | ORAL | 0 refills | Status: DC | PRN
Start: 2020-11-14 — End: 2023-09-13
  Filled 2020-11-14: qty 12, 4d supply, fill #0

## 2020-12-15 ENCOUNTER — Encounter: Payer: Self-pay | Admitting: Family Medicine

## 2020-12-15 DIAGNOSIS — R42 Dizziness and giddiness: Secondary | ICD-10-CM

## 2020-12-31 ENCOUNTER — Other Ambulatory Visit: Payer: Self-pay

## 2020-12-31 ENCOUNTER — Ambulatory Visit: Payer: 59 | Admitting: Family Medicine

## 2020-12-31 ENCOUNTER — Other Ambulatory Visit (HOSPITAL_COMMUNITY): Payer: Self-pay

## 2020-12-31 ENCOUNTER — Encounter: Payer: Self-pay | Admitting: Family Medicine

## 2020-12-31 VITALS — BP 122/76 | HR 89 | Temp 98.2°F | Wt 213.4 lb

## 2020-12-31 DIAGNOSIS — R42 Dizziness and giddiness: Secondary | ICD-10-CM | POA: Diagnosis not present

## 2020-12-31 MED ORDER — MECLIZINE HCL 25 MG PO TABS
12.5000 mg | ORAL_TABLET | Freq: Three times a day (TID) | ORAL | 0 refills | Status: DC | PRN
  Filled 2020-12-31: qty 15, 10d supply, fill #0

## 2020-12-31 NOTE — Progress Notes (Signed)
   Subjective:    Patient ID: Jasmine Ortega, female    DOB: [birth year], 40 y.o.   MRN: [id number]  HPI She is has a 3-week history of difficulty with feeling unsteady.  The initial episode lasted roughly 2 hours but no associated blurred vision, double vision, headache, position change.,  Nausea or vomiting.  Since then she has noted if she leans forward or backwards she will sometimes have difficulty with dizziness.  She is also noted some slight leg fatigue and did at 1 point have a slight tremor.   Review of Systems     Objective:   Physical Exam Alert and in no distress.  EOMI.  DTRs normal.  Normal cerebellar hand finger-to-nose.  No clonus.  Other cranial nerves grossly intact.  Cardiac exam shows regular rhythm without murmurs or gallops.  Lungs are clear to auscultation.       Assessment & Plan:  Vertigo - Plan: meclizine (ANTIVERT) 12.5 MG tablet, Ambulatory referral to Physical Therapy Some of her symptoms are suggestive of BPPV but is not clear-cut.  I think it is reasonable to try her with Epley maneuvers to see if that helps.  If she continues have difficulty we can certainly pursue other issues.  Discussed neurology versus ENT and we will discuss that again later if needed.  She was comfortable with this.

## 2021-01-23 NOTE — Progress Notes (Signed)
NEUROLOGY CONSULTATION NOTE  ANDRIANNE VILCHES MRN: [id number] DOB: Jul 03, 1980  Referring provider: Jill Alexanders, MD Primary care provider: Jill Alexanders, MD  Reason for consult:  dizziness  Assessment/Plan:   Vestibular disequilibrium - Query Mal de Debarquement Syndrome triggered by car ride?  Does not appear to be benign paroxysmal positional vertigo or migraine.  Given absence of vertigo, unlikely to be vestibular neuritis.     Will check MRI of brain with and without contrast Follow up after test. Further recommendations pending results.   Subjective:  Jasmine Ortega is a 40 year old right-handed female with ADHD who presents for dizziness.  History supplemented by referring provider's note.  Symptoms started 6 weeks ago.  No preceding head injury, illness or change in medication.  She drove to [place] to visit her father.  At her father's house, she felt unsteady on feet.  She thought that maybe the floors in her father's house were uneven.  Symptoms have since persisted.  She denies dizziness, vertigo, lightheadedness.  It feels like she is on a boat.  Even if she is sitting at the desk and she bends forward or back, her body feels unsteady.  No sinus or ear pressure.  Sometimes she may feel a dull head pressure in the back of her head without associated symptoms (nausea, vomiting, photophobia, phonophobia, visual disturbance, numbness or weakness), lasting an hour and occurring once a week.  Treats with ibuprofen. In the evening, she may feel nauseous but no vomiting.  She has chronic tinnitus that may be aggravated by her bruxism but nothing new.  No double vision or hearing loss.  She tried treating with B12, decongestants, increased/decreased caffeine and OTC analgesics.     PAST MEDICAL HISTORY: Past Medical History:  Diagnosis Date   Birth control    Chicken pox    Depression    GERD (gastroesophageal reflux disease)    Gestational diabetes     PAST SURGICAL HISTORY: No  past surgical history on file.  MEDICATIONS: Current Outpatient Medications on File Prior to Visit  Medication Sig Dispense Refill   Cholecalciferol (VITAMIN D) 125 MCG (5000 UT) CAPS      cyclobenzaprine (FLEXERIL) 10 MG tablet Take 1 tablet (10 mg total) by mouth 3 (three) times daily as needed for muscle spasms. 12 tablet 0   levonorgestrel (MIRENA, 52 MG,) 20 MCG/24HR IUD Mirena 20 mcg/24 hours (5 yrs) 52 mg intrauterine device  Take 1 device by intrauterine route.     meclizine (ANTIVERT) 25 MG tablet Take 1/2 tablet (12.5 mg total) by mouth 3 (three) times daily as needed for dizziness. 15 tablet 0   methylphenidate 10 MG ER tablet TAKE 1 TABLET BY MOUTH ONCE A DAY 90 tablet 0   Omega 3 1000 MG CAPS      omeprazole (PRILOSEC) 20 MG capsule Take 20 mg by mouth daily.     No current facility-administered medications on file prior to visit.    ALLERGIES: Allergies  Allergen Reactions   Meloxicam Anaphylaxis    Also hives    FAMILY HISTORY: Family History  Problem Relation Age of Onset   Arthritis Father    Hyperlipidemia Father    Hypertension Father    Arthritis Paternal Grandmother    Heart disease Paternal Grandmother    Breast cancer Mother    Cancer Mother 62       Breast cancer   Miscarriages / Stillbirths Maternal Grandfather    Diabetes Paternal Grandfather  Objective:  Blood pressure 124/80, pulse 100, height '5\' 4"'$  (1.626 m), weight 212 lb 9.6 oz (96.4 kg), SpO2 98 %. General: No acute distress.  Patient appears well-groomed.   Head:  Normocephalic/atraumatic Eyes:  fundi examined but not visualized Neck: supple, no paraspinal tenderness, full range of motion Back: No paraspinal tenderness Heart: regular rate and rhythm Lungs: Clear to auscultation bilaterally. Vascular: No carotid bruits. Neurological Exam: Mental status: alert and oriented to person, place, and time, recent and remote memory intact, fund of knowledge intact, attention and  concentration intact, speech fluent and not dysarthric, language intact. Cranial nerves: CN I: not tested CN II: pupils equal, round and reactive to light, visual fields intact CN III, IV, VI:  full range of motion, no nystagmus, no ptosis CN V: facial sensation intact. CN VII: upper and lower face symmetric CN VIII: hearing intact CN IX, X: gag intact, uvula midline CN XI: sternocleidomastoid and trapezius muscles intact CN XII: tongue midline Bulk & Tone: normal, no fasciculations. Motor:  muscle strength 5/5 throughout Sensation:  Pinprick, temperature and vibratory sensation intact. Deep Tendon Reflexes:  2+ throughout,  toes downgoing.   Finger to nose testing:  Without dysmetria.   Heel to shin:  Without dysmetria.   Gait:  Normal station and stride.  Romberg negative.    Thank you for allowing me to take part in the care of this patient.  Metta Clines, DO  CC: Jill Alexanders, MD

## 2021-01-26 ENCOUNTER — Other Ambulatory Visit: Payer: Self-pay

## 2021-01-26 ENCOUNTER — Ambulatory Visit: Payer: 59 | Admitting: Neurology

## 2021-01-26 ENCOUNTER — Encounter: Payer: Self-pay | Admitting: Neurology

## 2021-01-26 VITALS — BP 124/80 | HR 100 | Ht 64.0 in | Wt 212.6 lb

## 2021-01-26 DIAGNOSIS — H832X9 Labyrinthine dysfunction, unspecified ear: Secondary | ICD-10-CM

## 2021-01-26 DIAGNOSIS — R270 Ataxia, unspecified: Secondary | ICD-10-CM

## 2021-01-26 NOTE — Patient Instructions (Signed)
MRI of brain with and without contrast Follow up afterwards.  Further recommendations pending results.

## 2021-02-18 ENCOUNTER — Other Ambulatory Visit: Payer: Self-pay

## 2021-02-18 ENCOUNTER — Ambulatory Visit
Admission: RE | Admit: 2021-02-18 | Discharge: 2021-02-18 | Disposition: A | Discharge status: Home / Self Care | Payer: 59 | Source: Ambulatory Visit | Attending: Neurology | Admitting: Neurology

## 2021-02-18 DIAGNOSIS — J341 Cyst and mucocele of nose and nasal sinus: Secondary | ICD-10-CM | POA: Diagnosis not present

## 2021-02-18 DIAGNOSIS — R29818 Other symptoms and signs involving the nervous system: Secondary | ICD-10-CM | POA: Diagnosis not present

## 2021-02-18 DIAGNOSIS — R270 Ataxia, unspecified: Secondary | ICD-10-CM

## 2021-02-18 DIAGNOSIS — H832X9 Labyrinthine dysfunction, unspecified ear: Secondary | ICD-10-CM

## 2021-02-18 MED ORDER — GADOBENATE DIMEGLUMINE 529 MG/ML IV SOLN
20.0000 mL | Freq: Once | INTRAVENOUS | Status: AC | PRN
  Administered 2021-02-18: 20 mL via INTRAVENOUS

## 2021-02-19 NOTE — Progress Notes (Signed)
Patient advised of MRI results voiced understand.

## 2021-02-24 ENCOUNTER — Encounter: Payer: Self-pay | Admitting: Family Medicine

## 2021-02-26 ENCOUNTER — Telehealth: Payer: 59 | Admitting: Physician Assistant

## 2021-02-26 DIAGNOSIS — B9689 Other specified bacterial agents as the cause of diseases classified elsewhere: Secondary | ICD-10-CM | POA: Diagnosis not present

## 2021-02-26 DIAGNOSIS — J019 Acute sinusitis, unspecified: Secondary | ICD-10-CM | POA: Diagnosis not present

## 2021-02-27 ENCOUNTER — Other Ambulatory Visit (HOSPITAL_COMMUNITY): Payer: Self-pay

## 2021-02-27 MED ORDER — AMOXICILLIN-POT CLAVULANATE 875-125 MG PO TABS
1.0000 | ORAL_TABLET | Freq: Two times a day (BID) | ORAL | 0 refills | Status: DC
  Filled 2021-02-27: qty 14, 7d supply, fill #0

## 2021-02-27 NOTE — Progress Notes (Signed)
E-Visit for Sinus Problems  We are sorry that you are not feeling well.  Here is how we plan to help!  Based on what you have shared with me it looks like you have sinusitis.  Sinusitis is inflammation and infection in the sinus cavities of the head.  Based on your presentation I believe you most likely have Acute Bacterial Sinusitis.  This is an infection caused by bacteria and is treated with antibiotics. I have prescribed Augmentin 875mg /125mg  one tablet twice daily with food, for 7 days. You may use an oral decongestant such as Mucinex D or if you have glaucoma or high blood pressure use plain Mucinex. Saline nasal spray help and can safely be used as often as needed for congestion.  If you develop worsening sinus pain, fever or notice severe headache and vision changes, or if symptoms are not better after completion of antibiotic, please schedule an appointment with a health care provider.    Sinus infections are not as easily transmitted as other respiratory infection, however we still recommend that you avoid close contact with loved ones, especially the very young and elderly.  Remember to wash your hands thoroughly throughout the day as this is the number one way to prevent the spread of infection!  Home Care: Only take medications as instructed by your medical team. Complete the entire course of an antibiotic. Do not take these medications with alcohol. A steam or ultrasonic humidifier can help congestion.  You can place a towel over your head and breathe in the steam from hot water coming from a faucet. Avoid close contacts especially the very young and the elderly. Cover your mouth when you cough or sneeze. Always remember to wash your hands.  Get Help Right Away If: You develop worsening fever or sinus pain. You develop a severe head ache or visual changes. Your symptoms persist after you have completed your treatment plan.  Make sure you Understand these instructions. Will watch  your condition. Will get help right away if you are not doing well or get worse.  Thank you for choosing an e-visit.  Your e-visit answers were reviewed by a board certified advanced clinical practitioner to complete your personal care plan. Depending upon the condition, your plan could have included both over the counter or prescription medications.  Please review your pharmacy choice. Make sure the pharmacy is open so you can pick up prescription now. If there is a problem, you may contact your provider through [employer] and have the prescription routed to another pharmacy.  Your safety is important to [place]. If you have drug allergies check your prescription carefully.   For the next 24 hours you can use MyChart to ask questions about today's visit, request a non-urgent call back, or ask for a work or school excuse. You will get an email in the next two days asking about your experience. I hope that your e-visit has been valuable and will speed your recovery.  I provided 6 minutes of non face-to-face time during this encounter for chart review and documentation.

## 2021-03-14 ENCOUNTER — Other Ambulatory Visit: Payer: Self-pay | Admitting: Family Medicine

## 2021-03-14 DIAGNOSIS — F900 Attention-deficit hyperactivity disorder, predominantly inattentive type: Secondary | ICD-10-CM

## 2021-03-16 ENCOUNTER — Other Ambulatory Visit (HOSPITAL_COMMUNITY): Payer: Self-pay

## 2021-03-16 MED ORDER — METHYLPHENIDATE HCL ER 10 MG PO TBCR
EXTENDED_RELEASE_TABLET | Freq: Every day | ORAL | 0 refills | Status: DC
Start: 2021-03-20 — End: 2021-04-22

## 2021-03-16 NOTE — Telephone Encounter (Signed)
Jasmine Ortega is requesting to fill pt methylphenidate. Please advise [facility]

## 2021-03-24 ENCOUNTER — Encounter: Payer: Self-pay | Admitting: Family Medicine

## 2021-03-31 ENCOUNTER — Other Ambulatory Visit (HOSPITAL_COMMUNITY): Payer: Self-pay

## 2021-03-31 MED ORDER — ESCITALOPRAM OXALATE 10 MG PO TABS
10.0000 mg | ORAL_TABLET | Freq: Every day | ORAL | 4 refills | Status: DC
  Filled 2021-03-31: qty 90, 90d supply, fill #0
  Filled 2021-06-26: qty 90, 90d supply, fill #1

## 2021-04-22 ENCOUNTER — Encounter: Payer: Self-pay | Admitting: Family Medicine

## 2021-04-22 ENCOUNTER — Other Ambulatory Visit (HOSPITAL_COMMUNITY): Payer: Self-pay

## 2021-04-22 MED ORDER — ADDERALL XR 10 MG PO CP24
10.0000 mg | ORAL_CAPSULE | Freq: Every day | ORAL | 0 refills | Status: DC
  Filled 2021-04-22: qty 30, 30d supply, fill #0

## 2021-06-03 ENCOUNTER — Ambulatory Visit: Payer: 59 | Admitting: Dermatology

## 2021-06-03 ENCOUNTER — Encounter: Payer: Self-pay | Admitting: Dermatology

## 2021-06-03 ENCOUNTER — Other Ambulatory Visit: Payer: Self-pay

## 2021-06-03 ENCOUNTER — Other Ambulatory Visit (HOSPITAL_COMMUNITY): Payer: Self-pay

## 2021-06-03 DIAGNOSIS — D235 Other benign neoplasm of skin of trunk: Secondary | ICD-10-CM | POA: Diagnosis not present

## 2021-06-03 DIAGNOSIS — L700 Acne vulgaris: Secondary | ICD-10-CM

## 2021-06-03 DIAGNOSIS — Z1283 Encounter for screening for malignant neoplasm of skin: Secondary | ICD-10-CM | POA: Diagnosis not present

## 2021-06-03 DIAGNOSIS — D239 Other benign neoplasm of skin, unspecified: Secondary | ICD-10-CM

## 2021-06-03 MED ORDER — DAPSONE 5 % EX GEL
CUTANEOUS | 2 refills | Status: AC
  Filled 2021-06-03: qty 90, 90d supply, fill #0

## 2021-06-04 ENCOUNTER — Other Ambulatory Visit (HOSPITAL_COMMUNITY): Payer: Self-pay

## 2021-06-22 ENCOUNTER — Encounter: Payer: Self-pay | Admitting: Dermatology

## 2021-06-22 ENCOUNTER — Encounter: Payer: Self-pay | Admitting: Family Medicine

## 2021-06-22 NOTE — Progress Notes (Signed)
° °  New Patient   Subjective  Jasmine Ortega is a 40 y.o. female who presents for the following: Annual Exam (No new concerns- wants few spots checked, face, legs & back especially ).  General skin examination, several places to check.  Discussed facial breaking out Location:  Duration:  Quality:  Associated Signs/Symptoms: Modifying Factors:  Severity:  Timing: Context:    The following portions of the chart were reviewed this encounter and updated as appropriate:  Tobacco   Allergies   Meds   Problems   Med Hx   Surg Hx   Fam Hx       Objective  Well appearing patient in no apparent distress; mood and affect are within normal limits. Head Full body skin check red head, fair, freckles with many sun burns per patient. Under the left breast  possible ingrown hair.  No atypical pigmented lesions or nonmelanoma skin cancer.  Torso - Posterior (Back) 4 mm pink firm dermal papule  Left Anterior Mandible Inflammatory acne lower half of face.  Etiology discussed.    A full examination was performed including scalp, head, eyes, ears, nose, lips, neck, chest, axillae, abdomen, back, buttocks, bilateral upper extremities, bilateral lower extremities, hands, feet, fingers, toes, fingernails, and toenails. All findings within normal limits unless otherwise noted below.  Areas beneath undergarments not fully examined.   Assessment & Plan  Screening exam for skin cancer Head  Annual skin examination, encouraged to self examine with spouse twice annually.  Continue ultraviolet protection.  Dermatofibroma Torso - Posterior (Back)  Safe to leave if stable  Acne vulgaris Left Anterior Mandible  Topical dapsone to areas prone to breakout nightly for 6 to 8 weeks; follow-up at that time by MyChart.  Dapsone 5 % topical gel - Left Anterior Mandible Apply to acne nightly

## 2021-06-23 ENCOUNTER — Other Ambulatory Visit (HOSPITAL_COMMUNITY): Payer: Self-pay

## 2021-06-23 MED ORDER — AMPHETAMINE-DEXTROAMPHET ER 15 MG PO CP24
15.0000 mg | ORAL_CAPSULE | ORAL | 0 refills | Status: DC
  Filled 2021-06-23: qty 30, 30d supply, fill #0

## 2021-06-23 MED ORDER — AMPHETAMINE-DEXTROAMPHET ER 15 MG PO CP24
15.0000 mg | ORAL_CAPSULE | ORAL | 0 refills | Status: DC
  Filled 2021-06-23 (×2): qty 90, 90d supply, fill #0

## 2021-06-23 NOTE — Addendum Note (Signed)
Addended by: Denita Lung on: 06/23/2021 05:17 PM   Modules accepted: Orders

## 2021-06-26 ENCOUNTER — Other Ambulatory Visit (HOSPITAL_COMMUNITY): Payer: Self-pay

## 2021-09-21 ENCOUNTER — Other Ambulatory Visit (HOSPITAL_COMMUNITY): Payer: Self-pay

## 2021-09-21 ENCOUNTER — Encounter: Payer: Self-pay | Admitting: Family Medicine

## 2021-09-21 DIAGNOSIS — Z0000 Encounter for general adult medical examination without abnormal findings: Secondary | ICD-10-CM

## 2021-09-21 DIAGNOSIS — E785 Hyperlipidemia, unspecified: Secondary | ICD-10-CM

## 2021-09-21 MED ORDER — ESCITALOPRAM OXALATE 10 MG PO TABS
ORAL_TABLET | ORAL | 4 refills | Status: DC
  Filled 2021-09-21: qty 135, 90d supply, fill #0

## 2021-09-22 DIAGNOSIS — Z0000 Encounter for general adult medical examination without abnormal findings: Secondary | ICD-10-CM | POA: Diagnosis not present

## 2021-09-22 DIAGNOSIS — E785 Hyperlipidemia, unspecified: Secondary | ICD-10-CM | POA: Diagnosis not present

## 2021-09-23 ENCOUNTER — Other Ambulatory Visit (HOSPITAL_COMMUNITY): Payer: Self-pay

## 2021-09-23 ENCOUNTER — Encounter: Payer: Self-pay | Admitting: Family Medicine

## 2021-09-23 ENCOUNTER — Ambulatory Visit: Payer: 59 | Admitting: Family Medicine

## 2021-09-23 VITALS — BP 110/70 | HR 78 | Temp 98.1°F | Ht 64.0 in | Wt 223.6 lb

## 2021-09-23 DIAGNOSIS — F341 Dysthymic disorder: Secondary | ICD-10-CM

## 2021-09-23 DIAGNOSIS — G8929 Other chronic pain: Secondary | ICD-10-CM | POA: Diagnosis not present

## 2021-09-23 DIAGNOSIS — E785 Hyperlipidemia, unspecified: Secondary | ICD-10-CM | POA: Diagnosis not present

## 2021-09-23 DIAGNOSIS — Z0000 Encounter for general adult medical examination without abnormal findings: Secondary | ICD-10-CM | POA: Diagnosis not present

## 2021-09-23 DIAGNOSIS — K219 Gastro-esophageal reflux disease without esophagitis: Secondary | ICD-10-CM

## 2021-09-23 DIAGNOSIS — M545 Low back pain, unspecified: Secondary | ICD-10-CM | POA: Diagnosis not present

## 2021-09-23 DIAGNOSIS — F900 Attention-deficit hyperactivity disorder, predominantly inattentive type: Secondary | ICD-10-CM | POA: Diagnosis not present

## 2021-09-23 DIAGNOSIS — E669 Obesity, unspecified: Secondary | ICD-10-CM

## 2021-09-23 LAB — CBC WITH DIFFERENTIAL/PLATELET
Absolute Monocytes: 567 cells/uL (ref 200–950)
Basophils Absolute: 49 cells/uL (ref 0–200)
Basophils Relative: 0.7 %
Eosinophils Absolute: 189 cells/uL (ref 15–500)
Eosinophils Relative: 2.7 %
HCT: 40.9 % (ref 35.0–45.0)
Hemoglobin: 13.4 g/dL (ref 11.7–15.5)
Lymphs Abs: 2107 cells/uL (ref 850–3900)
MCH: 28.8 pg (ref 27.0–33.0)
MCHC: 32.8 g/dL (ref 32.0–36.0)
MCV: 87.8 fL (ref 80.0–100.0)
MPV: 10.7 fL (ref 7.5–12.5)
Monocytes Relative: 8.1 %
Neutro Abs: 4088 cells/uL (ref 1500–7800)
Neutrophils Relative %: 58.4 %
Platelets: 247 10*3/uL (ref 140–400)
RBC: 4.66 10*6/uL (ref 3.80–5.10)
RDW: 13.1 % (ref 11.0–15.0)
Total Lymphocyte: 30.1 %
WBC: 7 10*3/uL (ref 3.8–10.8)

## 2021-09-23 LAB — LIPID PANEL
Cholesterol: 207 mg/dL — ABNORMAL HIGH (ref <200)
HDL: 63 mg/dL (ref >=50)
LDL Cholesterol (Calc): 124 mg/dL (calc) — ABNORMAL HIGH
Non-HDL Cholesterol (Calc): 144 mg/dL (calc) — ABNORMAL HIGH (ref <130)
Total CHOL/HDL Ratio: 3.3 (calc) (ref <5.0)
Triglycerides: 101 mg/dL (ref <150)

## 2021-09-23 LAB — COMPREHENSIVE METABOLIC PANEL
AG Ratio: 1.6 (calc) (ref 1.0–2.5)
ALT: 19 U/L (ref 6–29)
AST: 14 U/L (ref 10–30)
Albumin: 4.2 g/dL (ref 3.6–5.1)
Alkaline phosphatase (APISO): 57 U/L (ref 31–125)
BUN: 17 mg/dL (ref 7–25)
CO2: 29 mmol/L (ref 20–32)
Calcium: 9 mg/dL (ref 8.6–10.2)
Chloride: 103 mmol/L (ref 98–110)
Creat: 0.75 mg/dL (ref 0.50–0.99)
Globulin: 2.7 g/dL (calc) (ref 1.9–3.7)
Glucose, Bld: 102 mg/dL — ABNORMAL HIGH (ref 65–99)
Potassium: 4.4 mmol/L (ref 3.5–5.3)
Sodium: 139 mmol/L (ref 135–146)
Total Bilirubin: 0.7 mg/dL (ref 0.2–1.2)
Total Protein: 6.9 g/dL (ref 6.1–8.1)

## 2021-09-23 LAB — TSH: TSH: 1.81 mIU/L

## 2021-09-23 LAB — HEMOGLOBIN A1C
Hgb A1c MFr Bld: 5.5 % of total Hgb (ref <5.7)
Mean Plasma Glucose: 111 mg/dL
eAG (mmol/L): 6.2 mmol/L

## 2021-09-23 MED ORDER — ESCITALOPRAM OXALATE 20 MG PO TABS
20.0000 mg | ORAL_TABLET | Freq: Every day | ORAL | 1 refills | Status: DC
  Filled 2021-09-23: qty 30, 30d supply, fill #0
  Filled 2021-11-01: qty 30, 30d supply, fill #1

## 2021-09-23 MED ORDER — WEGOVY 0.5 MG/0.5ML ~~LOC~~ SOAJ
0.5000 mg | SUBCUTANEOUS | 0 refills | Status: DC
  Filled 2021-09-23: qty 2, 28d supply, fill #0

## 2021-09-23 MED ORDER — AMPHETAMINE-DEXTROAMPHET ER 15 MG PO CP24
15.0000 mg | ORAL_CAPSULE | ORAL | 0 refills | Status: DC
  Filled 2021-09-23: qty 90, 90d supply, fill #0

## 2021-09-23 NOTE — Progress Notes (Signed)
? ?  Subjective:  ? ? Patient ID: Jasmine Ortega, female    DOB: 02/01/1981, 41 y.o.   MRN: [id number] ? ?HPI ?She is here for complete examination.  She continues have difficulty with low back pain and has been through physical therapy with some success.  She is concerned that this could be pelvic in nature and plans to do some Kegel maneuvers to help with that.  She does exercise fairly regularly.  She does have ADHD and seems to be doing quite nicely on her present regimen of Adderall.  It helps her get through the day without difficulty.  She does have some reflux disease that seems to have this under good control.  She notes that she does need Lexapro and has been on 15 mg for quite some time.  Work and home life are fairly stable.  Otherwise family and social history as well as health maintenance and immunizations was reviewed ? ? ?Review of Systems  ?All other systems reviewed and are negative. ? ?   ?Objective:  ? Physical Exam ?Alert and in no distress. Tympanic membranes and canals are normal. Pharyngeal area is normal. Neck is supple without adenopathy or thyromegaly. Cardiac exam shows a regular sinus rhythm without murmurs or gallops. Lungs are clear to auscultation. ? ? ? ? ?   ?Assessment & Plan:  ?Routine general medical examination at a health care facility ? ?Hyperlipidemia, unspecified hyperlipidemia type ? ?Obesity (BMI 30-39.9) - Plan: Semaglutide-Weight Management (WEGOVY) 0.5 MG/0.5ML SOAJ: I discussed the use of Wegovy with her.  Sample of the 0.25 mg given.  Discussed need for diet and exercise.  Discussed possible side effects of the medication.  Plan to recheck her here in about 2 months. ? ?Attention deficit hyperactivity disorder (ADHD), predominantly inattentive type - Plan: amphetamine-dextroamphetamine (ADDERALL XR) 15 MG 24 hr capsule ? ?Gastroesophageal reflux disease without esophagitis: Continue with conservative care. ? ?Dysthymia - Plan: escitalopram (LEXAPRO) 20 MG tablet: Discussed  making her regimen a little easier by giving her the 20 mg.  She will keep me informed as to how well this is working for her rather than the 15 mg. ? ?Chronic low back pain without sciatica, unspecified back pain laterality ?Discussed the benefit of continuing with her physical therapy exercises and consider going ahead and doing the Kegel maneuvers to see if it will work. ? ?

## 2021-09-26 ENCOUNTER — Telehealth: Payer: Self-pay

## 2021-09-26 ENCOUNTER — Other Ambulatory Visit (HOSPITAL_COMMUNITY): Payer: Self-pay

## 2021-09-26 NOTE — Telephone Encounter (Signed)
P.A. WEGOVY 

## 2021-09-28 ENCOUNTER — Other Ambulatory Visit (HOSPITAL_COMMUNITY): Payer: Self-pay

## 2021-09-29 NOTE — Telephone Encounter (Signed)
P.A. approved til 12/26/21, faxed pharmacy, sent mychart message

## 2021-10-01 ENCOUNTER — Other Ambulatory Visit (HOSPITAL_COMMUNITY): Payer: Self-pay

## 2021-11-02 ENCOUNTER — Other Ambulatory Visit (HOSPITAL_COMMUNITY): Payer: Self-pay

## 2021-11-04 DIAGNOSIS — Z1231 Encounter for screening mammogram for malignant neoplasm of breast: Secondary | ICD-10-CM | POA: Diagnosis not present

## 2021-11-04 DIAGNOSIS — Z01419 Encounter for gynecological examination (general) (routine) without abnormal findings: Secondary | ICD-10-CM | POA: Diagnosis not present

## 2021-11-04 LAB — HM MAMMOGRAPHY

## 2021-11-16 ENCOUNTER — Other Ambulatory Visit (HOSPITAL_COMMUNITY): Payer: Self-pay

## 2021-11-16 ENCOUNTER — Encounter: Payer: Self-pay | Admitting: Family Medicine

## 2021-11-16 MED ORDER — WEGOVY 1 MG/0.5ML ~~LOC~~ SOAJ
1.0000 mg | SUBCUTANEOUS | 1 refills | Status: DC
  Filled 2021-11-16: qty 2, 28d supply, fill #0

## 2021-11-25 ENCOUNTER — Ambulatory Visit: Payer: 59 | Admitting: Family Medicine

## 2021-11-29 ENCOUNTER — Other Ambulatory Visit: Payer: Self-pay | Admitting: Family Medicine

## 2021-11-29 DIAGNOSIS — F341 Dysthymic disorder: Secondary | ICD-10-CM

## 2021-11-30 ENCOUNTER — Other Ambulatory Visit (HOSPITAL_COMMUNITY): Payer: Self-pay

## 2021-11-30 MED ORDER — ESCITALOPRAM OXALATE 20 MG PO TABS
20.0000 mg | ORAL_TABLET | Freq: Every day | ORAL | 1 refills | Status: DC
  Filled 2021-11-30: qty 30, 30d supply, fill #0
  Filled 2021-12-28: qty 30, 30d supply, fill #1

## 2021-11-30 NOTE — Telephone Encounter (Signed)
Is this okay to refill? 

## 2021-12-12 ENCOUNTER — Other Ambulatory Visit (HOSPITAL_COMMUNITY): Payer: Self-pay

## 2021-12-26 ENCOUNTER — Other Ambulatory Visit (HOSPITAL_COMMUNITY): Payer: Self-pay

## 2021-12-28 ENCOUNTER — Other Ambulatory Visit (HOSPITAL_COMMUNITY): Payer: Self-pay

## 2022-01-14 ENCOUNTER — Encounter: Payer: Self-pay | Admitting: Family Medicine

## 2022-01-14 DIAGNOSIS — M549 Dorsalgia, unspecified: Secondary | ICD-10-CM

## 2022-01-25 ENCOUNTER — Other Ambulatory Visit (HOSPITAL_COMMUNITY): Payer: Self-pay

## 2022-01-25 ENCOUNTER — Encounter: Payer: Self-pay | Admitting: Family Medicine

## 2022-01-25 DIAGNOSIS — F341 Dysthymic disorder: Secondary | ICD-10-CM

## 2022-01-25 MED ORDER — ESCITALOPRAM OXALATE 20 MG PO TABS
20.0000 mg | ORAL_TABLET | Freq: Every day | ORAL | 0 refills | Status: DC
  Filled 2022-01-25: qty 90, 90d supply, fill #0

## 2022-01-30 ENCOUNTER — Telehealth: Payer: 59 | Admitting: Nurse Practitioner

## 2022-01-30 DIAGNOSIS — H5789 Other specified disorders of eye and adnexa: Secondary | ICD-10-CM | POA: Diagnosis not present

## 2022-01-30 MED ORDER — OFLOXACIN 0.3 % OP SOLN: 1.0000 [drp] | Freq: Four times a day (QID) | OPHTHALMIC | 0 refills | Status: DC

## 2022-01-30 NOTE — Progress Notes (Signed)
E-Visit for Jasmine Ortega   We are sorry that you are not feeling well.  Here is how we plan to help!  Based on what you have shared with me it looks like you have conjunctivitis.  Conjunctivitis is a common inflammatory or infectious condition of the eye that is often referred to as "pink eye".  In most cases it is contagious (viral or bacterial). However, not all conjunctivitis requires antibiotics (ex. Allergic).  We have made appropriate suggestions for you based upon your presentation.  I have prescribed Oflaxacin 1-2 drops 4 times a day times 5 days   Pink eye can be highly contagious.  It is typically spread through direct contact with secretions, or contaminated objects or surfaces that one may have touched.  Strict handwashing is suggested with soap and water is urged.  If not available, use alcohol based had sanitizer.  Avoid unnecessary touching of the eye.  If you wear contact lenses, you will need to refrain from wearing them until you see no white discharge from the eye for at least 24 hours after being on medication.  You should see symptom improvement in 1-2 days after starting the medication regimen.  Call us if symptoms are not improved in 1-2 days.  Home Care: Wash your hands often! Do not wear your contacts until you complete your treatment plan. Avoid sharing towels, bed linen, personal items with a person who has pink eye. See attention for anyone in your home with similar symptoms.  Get Help Right Away If: Your symptoms do not improve. You develop blurred or loss of vision. Your symptoms worsen (increased discharge, pain or redness)   Thank you for choosing an e-visit.  Your e-visit answers were reviewed by a board certified advanced clinical practitioner to complete your personal care plan. Depending upon the condition, your plan could have included both over the counter or prescription medications.  Please review your pharmacy choice. Make sure the pharmacy is open so  you can pick up prescription now. If there is a problem, you may contact your provider through [employer] and have the prescription routed to another pharmacy.  Your safety is important to [place]. If you have drug allergies check your prescription carefully.   For the next 24 hours you can use MyChart to ask questions about today's visit, request a non-urgent call back, or ask for a work or school excuse. You will get an email in the next two days asking about your experience. I hope that your e-visit has been valuable and will speed your recovery.  5-10 minutes spent reviewing and documenting in chart.

## 2022-02-02 ENCOUNTER — Other Ambulatory Visit: Payer: Self-pay

## 2022-02-02 ENCOUNTER — Encounter: Payer: Self-pay | Admitting: Family Medicine

## 2022-02-02 DIAGNOSIS — Z111 Encounter for screening for respiratory tuberculosis: Secondary | ICD-10-CM

## 2022-02-03 ENCOUNTER — Other Ambulatory Visit: Payer: 59

## 2022-02-03 DIAGNOSIS — Z111 Encounter for screening for respiratory tuberculosis: Secondary | ICD-10-CM | POA: Diagnosis not present

## 2022-02-05 LAB — QUANTIFERON-TB GOLD PLUS
QuantiFERON Mitogen Value: >10 IU/mL
QuantiFERON Nil Value: 0.07 IU/mL
QuantiFERON TB1 Ag Value: 0.18 IU/mL
QuantiFERON TB2 Ag Value: 0.11 IU/mL
QuantiFERON-TB Gold Plus: NEGATIVE

## 2022-02-16 NOTE — Therapy (Addendum)
OUTPATIENT PHYSICAL THERAPY FEMALE PELVIC EVALUATION   Patient Name: Jasmine Ortega MRN: [id number] DOB:09/05/1980, 41 y.o., female Today's Date: 02/17/2022   PT End of Session - 02/17/22 1430     Visit Number 1    Date for PT Re-Evaluation 05/12/22    Authorization Type [employer] employee    PT Start Time 41    PT Stop Time 1505    PT Time Calculation (min) 40 min    Behavior During Therapy WFL for tasks assessed/performed             Past Medical History:  Diagnosis Date   Birth control    Chicken pox    Depression    GERD (gastroesophageal reflux disease)    Gestational diabetes    History reviewed. No pertinent surgical history. Patient Active Problem List   Diagnosis Date Noted   Chronic low back pain without sciatica 09/23/2021   Dysthymia 09/23/2021   Obesity (BMI 30-39.9) 09/14/2019   Hyperlipidemia 09/14/2019   Gastroesophageal reflux disease 04/27/2017   Attention deficit hyperactivity disorder (ADHD), predominantly inattentive type 03/05/2015   Loss of transverse plantar arch of right foot 09/18/2014    PCP: Denita Lung, MD  REFERRING PROVIDER: Denita Lung, MD  REFERRING DIAG: M54.9 (ICD-10-CM) - Back pain, unspecified back location, unspecified back pain laterality, unspecified chronicity  THERAPY DIAG:  Muscle weakness (generalized)  Abnormal posture  Other muscle spasm  Rationale for Evaluation and Treatment Rehabilitation  ONSET DATE: 5 years ago  SUBJECTIVE:                                                                                                                                                                                           SUBJECTIVE STATEMENT: I have had constant low back pain since vaginal delivery of twins 5 years. Fluid intake: Yes:       PAIN:  Are you having pain? Yes NPRS scale: 3/10 Pain location:  low back across and more on the right  Pain type: aching Pain description: intermittent and  constant   Aggravating factors: standing like meal prepping Relieving factors: ibuprofen and stretching  PRECAUTIONS: None  WEIGHT BEARING RESTRICTIONS No  FALLS:  Has patient fallen in last 6 months? No  LIVING ENVIRONMENT: Lives with: lives with their family, lives with their spouse, and twins and 53 y/o 50-50 step daughter Lives in: House/apartment   OCCUPATION: RN in pediatrics  PLOF: Independent  PATIENT GOALS back pain and feel stronger  PERTINENT HISTORY:  Vaginal delivery to twins Sexual abuse: No  BOWEL MOVEMENT Pain with bowel movement: No Type of bowel movement:Type ([place] Stool  Scale) normal, Frequency normal, and Strain No Fully empty rectum: Yes:     URINATION Pain with urination: No Fully empty bladder: Yes:   Stream: Strong Urgency: No Frequency: normal Leakage:  not generally Pads: No  INTERCOURSE Pain with intercourse:  no   PREGNANCY Vaginal deliveries twins Tearing Yes: 2nd degree  labial vaginal   PROLAPSE None    OBJECTIVE:   DIAGNOSTIC FINDINGS:    PATIENT SURVEYS:    PFIQ-7   COGNITION:  Overall cognitive status: Within functional limits for tasks assessed     SENSATION:    MUSCLE LENGTH: Hamstrings: Right WFL deg; Left WFL deg Thomas test:   LUMBAR SPECIAL TESTS:  Straight leg raise test: Negative  FUNCTIONAL TESTS:  Single leg stand Lt side less stability and mild trendelenburg bil  GAIT:  Comments: WFL               POSTURE: forward head and decreased thoracic kyphosis   PELVIC ALIGNMENT:  LUMBARAROM/PROM  A/PROM A/PROM  eval  Flexion 75%  Extension   Right lateral flexion 90%  Left lateral flexion 90%  Right rotation   Left rotation    (Blank rows = not tested)  LOWER EXTREMITY ROM:  Passive ROM Right eval Left eval  Hip flexion    Hip extension    Hip abduction    Hip adduction    Hip internal rotation    Hip external rotation    Knee flexion    Knee extension    Ankle  dorsiflexion    Ankle plantarflexion    Ankle inversion    Ankle eversion     (Blank rows = not tested)  LOWER EXTREMITY MMT:  MMT Right eval Left eval  Hip flexion 5/5 5/5  Hip extension 5/5 5/5  Hip abduction 5/5 5/5  Hip adduction 5/5 5/5  Hip internal rotation 5/5 5/5  Hip external rotation 5/5 5/5  Knee flexion    Knee extension    Ankle dorsiflexion    Ankle plantarflexion    Ankle inversion    Ankle eversion      PALPATION:   General  lumbar, thoracic, gluteals tight, abdominals low tone                External Perineal Exam normal appearance                             Internal Pelvic Floor higher tone posterior, not trigger points or tenderness slightly higher tone on the left side  Patient confirms identification and approves PT to assess internal pelvic floor and treatment Yes  PELVIC MMT:   MMT eval  Vaginal 2/5 MMT x 2 sec; can do 4 reps at 2/5 strength  Internal Anal Sphincter   External Anal Sphincter   Puborectalis   Diastasis Recti Bulge from umbilicus to xyphoid about 2 finger width  (Blank rows = not tested)        TONE: high  PROLAPSE: none  TODAY'S TREATMENT  Date: 02/17/22 EVAL and initial exercise with diaphragmatic breathing given   PATIENT EDUCATION:  Education details: Access Code: [email or web address] Person educated: Patient Education method: Explanation, Demonstration, Tactile cues, Verbal cues, and Handouts Education comprehension: verbalized understanding and returned demonstration   HOME EXERCISE PROGRAM: Access Code: [email or web address] URL: [email or web address] Date: 02/17/2022 Prepared by: Jari Favre  Exercises - Supine Diaphragmatic Breathing  - 3 x daily - 7 x weekly - 1 sets - 10 reps -  Supine Transversus Abdominis Bracing - Hands on Ground  - 1 x daily - 7 x weekly - 3 sets - 10 reps - Quadruped Circle Weight Shifts  - 1 x daily - 7 x weekly - 3 sets - 10 reps  ASSESSMENT:  CLINICAL IMPRESSION: Patient  is a pleasant 41 y.o. female who was seen today for physical therapy evaluation and treatment for chronic low back pain and diastasis rectus abdominus. Pt has 2 finger width diastasis with bulging superior to umbilicus.  Pt has some pelvic instability in single leg standing with lateral hip weakness.  She has decreased thoracic kyphosis and tight lumbar paraspinals.  Pt has tension in Rt hip>Lt hip.  Pt has anterior pelvic floor weakness and compensation from posterior pelvic floor. No trigger points present but she does have higher tone in posterior pelvic floor.  Pt will benefit from skilled PT to address impairments for improved strength and pain management.   OBJECTIVE IMPAIRMENTS decreased activity tolerance, decreased endurance, decreased ROM, decreased strength, increased muscle spasms, impaired tone, postural dysfunction, and pain.   ACTIVITY LIMITATIONS standing  PARTICIPATION LIMITATIONS: meal prep and playing with kids  PERSONAL FACTORS 1 comorbidity: twin delivery  are also affecting patient's functional outcome.   REHAB POTENTIAL: Excellent  CLINICAL DECISION MAKING: Evolving/moderate complexity  EVALUATION COMPLEXITY: Moderate   GOALS: Goals reviewed with patient? Yes  SHORT TERM GOALS: Target date: 03/17/2022  Ind with initial HEP Baseline: Goal status: INITIAL    LONG TERM GOALS: Target date: 05/12/2022   Pt will be independent with advanced HEP to maintain improvements made throughout therapy  Baseline:  Goal status: INITIAL  2.  Pt will report 60% reduction of pain due to improvements in posture, strength, and muscle length  Baseline:  Goal status: INITIAL  3.  Pt will have ability to do crunch 10x without abdominal bulging due to improved deep core strength Baseline:  Goal status: INITIAL  4.  Pt will be able to correctly brace core when playing with her children in order to prevent increased abdominal pain Baseline:  Goal status: INITIAL  5.  Pt will  be able to lift 20 lb for 10 reps correctly without increased pain and no abdominal tenting Baseline:  Goal status: INITIAL    PLAN: PT FREQUENCY: 1x/week  PT DURATION: 12 weeks  PLANNED INTERVENTIONS: Therapeutic exercises, Therapeutic activity, Neuromuscular re-education, Balance training, Gait training, Patient/Family education, Self Care, Joint mobilization, Dry Needling, Electrical stimulation, Spinal mobilization, Cryotherapy, Moist heat, Taping, Biofeedback, Manual therapy, and Re-evaluation  PLAN FOR NEXT SESSION: dry needling lumbar; cont transversus abdominus activation porgression, piriformis dry needling and stretch - give education handout for dry needling    Jule Ser, PT 02/17/2022, 5:29 PM  PHYSICAL THERAPY DISCHARGE SUMMARY  Visits from Start of Care: 1  Current functional level related to goals / functional outcomes: See above goals   Remaining deficits: See Jethro Poling   Education / Equipment: HEP   Patient agrees to discharge. Patient goals were not met. Patient is being discharged due to  personal reasons.  Pt only came to one visit.  Gustavus Bryant, PT 03/11/22 8:44 AM

## 2022-02-17 ENCOUNTER — Ambulatory Visit: Payer: 59 | Attending: Family Medicine | Admitting: Physical Therapy

## 2022-02-17 ENCOUNTER — Encounter: Payer: Self-pay | Admitting: Physical Therapy

## 2022-02-17 ENCOUNTER — Other Ambulatory Visit: Payer: Self-pay

## 2022-02-17 DIAGNOSIS — M62838 Other muscle spasm: Secondary | ICD-10-CM | POA: Diagnosis not present

## 2022-02-17 DIAGNOSIS — R293 Abnormal posture: Secondary | ICD-10-CM | POA: Insufficient documentation

## 2022-02-17 DIAGNOSIS — M6281 Muscle weakness (generalized): Secondary | ICD-10-CM | POA: Diagnosis not present

## 2022-02-17 DIAGNOSIS — M549 Dorsalgia, unspecified: Secondary | ICD-10-CM | POA: Diagnosis not present

## 2022-02-24 ENCOUNTER — Encounter: Payer: 59 | Admitting: Physical Therapy

## 2022-03-03 ENCOUNTER — Encounter: Payer: Self-pay | Admitting: Internal Medicine

## 2022-03-03 ENCOUNTER — Encounter: Payer: Self-pay | Admitting: Physical Therapy

## 2022-03-10 ENCOUNTER — Ambulatory Visit: Payer: 59 | Admitting: Physical Therapy

## 2022-03-17 ENCOUNTER — Encounter: Payer: Self-pay | Admitting: Physical Therapy

## 2022-03-24 ENCOUNTER — Encounter: Payer: Self-pay | Admitting: Physical Therapy

## 2022-03-31 ENCOUNTER — Ambulatory Visit: Payer: 59 | Admitting: Physical Therapy

## 2022-04-06 ENCOUNTER — Encounter: Payer: Self-pay | Admitting: Internal Medicine

## 2022-04-15 ENCOUNTER — Encounter: Payer: Self-pay | Admitting: Family Medicine

## 2022-04-19 ENCOUNTER — Encounter: Payer: Self-pay | Admitting: Internal Medicine

## 2022-04-29 ENCOUNTER — Other Ambulatory Visit: Payer: Self-pay | Admitting: Family Medicine

## 2022-04-29 ENCOUNTER — Other Ambulatory Visit (HOSPITAL_COMMUNITY): Payer: Self-pay

## 2022-04-29 ENCOUNTER — Encounter: Payer: Self-pay | Admitting: Family Medicine

## 2022-04-29 DIAGNOSIS — F341 Dysthymic disorder: Secondary | ICD-10-CM

## 2022-04-29 DIAGNOSIS — F900 Attention-deficit hyperactivity disorder, predominantly inattentive type: Secondary | ICD-10-CM

## 2022-04-29 MED ORDER — AMPHETAMINE-DEXTROAMPHET ER 15 MG PO CP24
15.0000 mg | ORAL_CAPSULE | ORAL | 0 refills | Status: DC
  Filled 2022-04-29: qty 90, 90d supply, fill #0

## 2022-04-29 MED ORDER — ESCITALOPRAM OXALATE 20 MG PO TABS
20.0000 mg | ORAL_TABLET | Freq: Every day | ORAL | 0 refills | Status: DC
  Filled 2022-04-29: qty 90, 90d supply, fill #0

## 2022-04-29 MED ORDER — OFLOXACIN 0.3 % OP SOLN
1.0000 [drp] | Freq: Four times a day (QID) | OPHTHALMIC | 0 refills | Status: DC
  Filled 2022-04-29: qty 5, 12d supply, fill #0

## 2022-04-29 NOTE — Telephone Encounter (Signed)
Ok to refill?  She also needs a refill on her Eye Drops, Pink eye is going around in her office

## 2022-04-30 ENCOUNTER — Other Ambulatory Visit (HOSPITAL_COMMUNITY): Payer: Self-pay

## 2022-04-30 MED ORDER — AMPHETAMINE-DEXTROAMPHET ER 5 MG PO CP24
5.0000 mg | ORAL_CAPSULE | Freq: Every day | ORAL | 0 refills | Status: DC
  Filled 2022-04-30: qty 90, 90d supply, fill #0

## 2022-06-29 ENCOUNTER — Encounter: Payer: Self-pay | Admitting: Family Medicine

## 2022-07-06 ENCOUNTER — Encounter: Payer: Self-pay | Admitting: Internal Medicine

## 2022-07-07 ENCOUNTER — Encounter: Payer: Self-pay | Admitting: Family Medicine

## 2022-07-07 ENCOUNTER — Ambulatory Visit (INDEPENDENT_AMBULATORY_CARE_PROVIDER_SITE_OTHER): Payer: Commercial Managed Care - PPO | Admitting: Family Medicine

## 2022-07-07 VITALS — BP 114/76 | HR 106 | Wt 230.6 lb

## 2022-07-07 DIAGNOSIS — F900 Attention-deficit hyperactivity disorder, predominantly inattentive type: Secondary | ICD-10-CM | POA: Diagnosis not present

## 2022-07-07 DIAGNOSIS — R002 Palpitations: Secondary | ICD-10-CM | POA: Diagnosis not present

## 2022-07-07 LAB — LIPID PANEL

## 2022-07-07 NOTE — Progress Notes (Signed)
   Subjective:    Patient ID: Leveda Anna, female    DOB: 03/26/1981, 42 y.o.   MRN: [id number]  HPI She is here for evaluation of occasional fluttering sensation that can last 5 to 10 seconds and usually causes her to cough and then the symptoms will go away.  She does drink coffee and is on Adderall for her underlying ADD however that has not changed recently.  No family history of heart disease.  She also has a symptom of pain in the left trapezius area that can sometimes go up to her neck and then down her left arm but she relates this to usually when she is on-call and does not notice this at other time.  She has had no chest pain, dyspnea on exertion or shortness of breath.   Review of Systems     Objective:   Physical Exam Alert and in no distress.  Cardiac exam shows regular rhythm without murmurs or gallops.  Lungs are clear to auscultation. EKG read by me shows a rate of 68 with other parameters being normal.      Assessment & Plan:  Fluttering sensation of heart - Plan: CBC with Differential/Platelet, Comprehensive metabolic panel, Lipid panel, TSH, Cardiac event monitor  Attention deficit hyperactivity disorder (ADHD), predominantly inattentive type I discussed this with her in detail.  Recommend that she try to check her pulse when she has when the fluttering sensations and also check her smart watch.  Will also get an event monitor to see what that we will show.

## 2022-07-07 NOTE — Patient Instructions (Signed)
Try to check your pulse when you have a fluttering sensation and also check your smart watch to see if there is any objective evidence of this

## 2022-07-08 LAB — COMPREHENSIVE METABOLIC PANEL
ALT: 30 IU/L (ref 0–32)
AST: 23 IU/L (ref 0–40)
Albumin/Globulin Ratio: 1.8 (ref 1.2–2.2)
Albumin: 4.3 g/dL (ref 3.9–4.9)
Alkaline Phosphatase: 75 IU/L (ref 44–121)
BUN/Creatinine Ratio: 23 (ref 9–23)
BUN: 17 mg/dL (ref 6–24)
Bilirubin Total: 0.5 mg/dL (ref 0.0–1.2)
CO2: 24 mmol/L (ref 20–29)
Calcium: 8.6 mg/dL — ABNORMAL LOW (ref 8.7–10.2)
Chloride: 101 mmol/L (ref 96–106)
Creatinine, Ser: 0.75 mg/dL (ref 0.57–1.00)
Globulin, Total: 2.4 g/dL (ref 1.5–4.5)
Glucose: 100 mg/dL — ABNORMAL HIGH (ref 70–99)
Potassium: 4.6 mmol/L (ref 3.5–5.2)
Sodium: 137 mmol/L (ref 134–144)
Total Protein: 6.7 g/dL (ref 6.0–8.5)
eGFR: 102 mL/min/{1.73_m2} (ref >59)

## 2022-07-08 LAB — CBC WITH DIFFERENTIAL/PLATELET
Basophils Absolute: 0 10*3/uL (ref 0.0–0.2)
Basos: 0 %
EOS (ABSOLUTE): 0.2 10*3/uL (ref 0.0–0.4)
Eos: 2 %
Hematocrit: 41 % (ref 34.0–46.6)
Hemoglobin: 13.8 g/dL (ref 11.1–15.9)
Immature Grans (Abs): 0.1 10*3/uL (ref 0.0–0.1)
Immature Granulocytes: 1 %
Lymphocytes Absolute: 1.9 10*3/uL (ref 0.7–3.1)
Lymphs: 21 %
MCH: 29.1 pg (ref 26.6–33.0)
MCHC: 33.7 g/dL (ref 31.5–35.7)
MCV: 87 fL (ref 79–97)
Monocytes Absolute: 0.9 10*3/uL (ref 0.1–0.9)
Monocytes: 10 %
Neutrophils Absolute: 6 10*3/uL (ref 1.4–7.0)
Neutrophils: 66 %
Platelets: 259 10*3/uL (ref 150–450)
RBC: 4.74 x10E6/uL (ref 3.77–5.28)
RDW: 12.6 % (ref 11.7–15.4)
WBC: 9 10*3/uL (ref 3.4–10.8)

## 2022-07-08 LAB — LIPID PANEL
Chol/HDL Ratio: 3.1 ratio (ref 0.0–4.4)
Cholesterol, Total: 205 mg/dL — ABNORMAL HIGH (ref 100–199)
HDL: 66 mg/dL (ref >39)
LDL Chol Calc (NIH): 115 mg/dL — ABNORMAL HIGH (ref 0–99)
Triglycerides: 137 mg/dL (ref 0–149)
VLDL Cholesterol Cal: 24 mg/dL (ref 5–40)

## 2022-07-08 LAB — TSH: TSH: 2.24 u[IU]/mL (ref 0.450–4.500)

## 2022-07-08 NOTE — Addendum Note (Signed)
Addended by: Denita Lung on: 07/08/2022 03:06 PM   Modules accepted: Orders

## 2022-07-23 ENCOUNTER — Other Ambulatory Visit (HOSPITAL_COMMUNITY): Payer: Self-pay

## 2022-07-23 ENCOUNTER — Encounter: Payer: Self-pay | Admitting: Family Medicine

## 2022-07-23 ENCOUNTER — Other Ambulatory Visit: Payer: Self-pay | Admitting: Family Medicine

## 2022-07-23 DIAGNOSIS — F900 Attention-deficit hyperactivity disorder, predominantly inattentive type: Secondary | ICD-10-CM

## 2022-07-23 DIAGNOSIS — F341 Dysthymic disorder: Secondary | ICD-10-CM

## 2022-07-23 MED ORDER — AMPHETAMINE-DEXTROAMPHET ER 15 MG PO CP24
15.0000 mg | ORAL_CAPSULE | ORAL | 0 refills | Status: DC
  Filled 2022-07-23 – 2022-07-28 (×3): qty 90, 90d supply, fill #0

## 2022-07-23 MED ORDER — ESCITALOPRAM OXALATE 20 MG PO TABS
20.0000 mg | ORAL_TABLET | Freq: Every day | ORAL | 0 refills | Status: DC
  Filled 2022-07-23: qty 90, 90d supply, fill #0

## 2022-07-23 MED ORDER — AMPHETAMINE-DEXTROAMPHET ER 5 MG PO CP24
5.0000 mg | ORAL_CAPSULE | Freq: Every day | ORAL | 0 refills | Status: DC
  Filled 2022-07-23: qty 90, 90d supply, fill #0

## 2022-07-23 NOTE — Telephone Encounter (Signed)
Refill request last apt 07/07/22.

## 2022-07-24 ENCOUNTER — Other Ambulatory Visit (HOSPITAL_COMMUNITY): Payer: Self-pay

## 2022-07-26 ENCOUNTER — Other Ambulatory Visit (HOSPITAL_COMMUNITY): Payer: Self-pay

## 2022-07-27 ENCOUNTER — Encounter: Payer: Self-pay | Admitting: Family Medicine

## 2022-07-27 ENCOUNTER — Other Ambulatory Visit (HOSPITAL_COMMUNITY): Payer: Self-pay

## 2022-07-27 ENCOUNTER — Telehealth: Payer: Commercial Managed Care - PPO | Admitting: Family Medicine

## 2022-07-27 DIAGNOSIS — F341 Dysthymic disorder: Secondary | ICD-10-CM

## 2022-07-27 MED ORDER — BUPROPION HCL ER (SR) 100 MG PO TB12
100.0000 mg | ORAL_TABLET | Freq: Every day | ORAL | 1 refills | Status: DC
  Filled 2022-07-27: qty 30, 30d supply, fill #0

## 2022-07-27 NOTE — Progress Notes (Signed)
   Subjective:    Patient ID: Leveda Anna, female    DOB: [birth year], 42 y.o.   MRN: [id number]  HPI Documentation for virtual audio and video telecommunications through [employer] encounter: The patient was located at home. 2 patient identifiers used.  The provider was located in the office. The patient did consent to this visit and is aware of possible charges through their insurance for this visit. The other persons participating in this telemedicine service were none. Time spent on call was 5 minutes and in review of previous records >15 minutes total for counseling and coordination of care. This virtual service is not related to other E/M service within previous 7 days.  She has had difficulty recently with what she calls brain freeze which essentially stating she feels stuck and unable to function and she is also noted some increased apathy.  She continues on Lexapro and until this point I done fairly well.  She is now back in school to get her DNP.  Apparently her sister has done well on the Effexor and she is wondering about going on that medication.  Review of Systems     Objective:   Physical Exam Alert and in no distress otherwise not examined       Assessment & Plan:  Dysthymia - Plan: buPROPion ER (WELLBUTRIN SR) 100 MG 12 hr tablet I discussed options with her and think that adding Wellbutrin might be the best choice especially since it has a norepinephrine effect and therefore might help with her underlying ADD.  She will call me in a month and let me know how she is doing.

## 2022-07-28 ENCOUNTER — Other Ambulatory Visit (HOSPITAL_COMMUNITY): Payer: Self-pay

## 2022-08-26 ENCOUNTER — Encounter: Payer: Self-pay | Admitting: Family Medicine

## 2022-08-31 ENCOUNTER — Telehealth: Payer: Commercial Managed Care - PPO | Admitting: Family Medicine

## 2022-08-31 ENCOUNTER — Other Ambulatory Visit (HOSPITAL_COMMUNITY): Payer: Self-pay

## 2022-08-31 ENCOUNTER — Encounter: Payer: Self-pay | Admitting: Family Medicine

## 2022-08-31 VITALS — Ht 64.0 in

## 2022-08-31 DIAGNOSIS — F341 Dysthymic disorder: Secondary | ICD-10-CM | POA: Diagnosis not present

## 2022-08-31 MED ORDER — VENLAFAXINE HCL ER 37.5 MG PO CP24
37.5000 mg | ORAL_CAPSULE | Freq: Every day | ORAL | 0 refills | Status: DC
  Filled 2022-08-31: qty 30, 30d supply, fill #0

## 2022-08-31 NOTE — Progress Notes (Signed)
   Subjective:    Patient ID: Jasmine Ortega, female    DOB: [birth year], 42 y.o.   MRN: [id number]  HPI Documentation for virtual audio and video telecommunications through [place] encounter: The patient was located at home. 2 patient identifiers used.  The provider was located in the office. The patient did consent to this visit and is aware of possible charges through their insurance for this visit. The other persons participating in this telemedicine service were none. Time spent on call was 5 minutes and in review of previous records >15 minutes total for counseling and coordination of care. This virtual service is not related to other E/M service within previous 7 days.  The visit today is to discuss further medication management.  She has been on Wellbutrin and states that it has not been very successful.  She still feels overwhelmed and sometimes frozen unable to get any accomplished.  She thinks that the Wellbutrin is actually made the feeling that she is somehow separated from her body worse.  She states that her sister has responded well to Effexor.  Review of Systems     Objective:   Physical Exam Alert and in no distress with appropriate affect.       Assessment & Plan:  Dysthymia - Plan: venlafaxine XR (EFFEXOR XR) 37.5 MG 24 hr capsule After discussion with her, she will stop the Wellbutrin and taper off Lexapro going down to 10 mg for 1 week and then stop.  She will start the Effexor 37.5 when she starts to taper down to 10 mg of Lexapro.  She is to call me in 2 weeks which will have a 1 week of just being on Effexor with the idea of increasing that to a higher dose.  I explained that we can go to a much higher level on Effexor.

## 2022-09-21 ENCOUNTER — Encounter: Payer: Self-pay | Admitting: Family Medicine

## 2022-09-21 ENCOUNTER — Other Ambulatory Visit (HOSPITAL_COMMUNITY): Payer: Self-pay

## 2022-09-21 MED ORDER — VENLAFAXINE HCL ER 75 MG PO CP24
75.0000 mg | ORAL_CAPSULE | Freq: Every day | ORAL | 1 refills | Status: DC
  Filled 2022-09-21: qty 30, 30d supply, fill #0
  Filled 2022-10-25: qty 30, 30d supply, fill #1

## 2022-10-25 ENCOUNTER — Other Ambulatory Visit: Payer: Self-pay | Admitting: Family Medicine

## 2022-10-25 DIAGNOSIS — F900 Attention-deficit hyperactivity disorder, predominantly inattentive type: Secondary | ICD-10-CM

## 2022-10-25 MED ORDER — AMPHETAMINE-DEXTROAMPHET ER 15 MG PO CP24
15.0000 mg | ORAL_CAPSULE | ORAL | 0 refills | Status: DC
  Filled 2022-10-25 – 2022-11-26 (×2): qty 90, 90d supply, fill #0

## 2022-10-25 MED ORDER — AMPHETAMINE-DEXTROAMPHET ER 5 MG PO CP24
5.0000 mg | ORAL_CAPSULE | Freq: Every day | ORAL | 0 refills | Status: DC
  Filled 2022-10-25: qty 90, 90d supply, fill #0
  Filled 2022-11-26: qty 80, 80d supply, fill #0
  Filled 2022-11-26: qty 10, 10d supply, fill #0

## 2022-10-26 ENCOUNTER — Other Ambulatory Visit: Payer: Self-pay

## 2022-10-26 ENCOUNTER — Other Ambulatory Visit (HOSPITAL_COMMUNITY): Payer: Self-pay

## 2022-10-28 ENCOUNTER — Other Ambulatory Visit: Payer: Self-pay

## 2022-10-28 ENCOUNTER — Other Ambulatory Visit (HOSPITAL_COMMUNITY): Payer: Self-pay

## 2022-11-02 ENCOUNTER — Other Ambulatory Visit (HOSPITAL_COMMUNITY): Payer: Self-pay

## 2022-11-10 DIAGNOSIS — Z01419 Encounter for gynecological examination (general) (routine) without abnormal findings: Secondary | ICD-10-CM | POA: Diagnosis not present

## 2022-11-10 DIAGNOSIS — Z124 Encounter for screening for malignant neoplasm of cervix: Secondary | ICD-10-CM | POA: Diagnosis not present

## 2022-11-10 DIAGNOSIS — Z1231 Encounter for screening mammogram for malignant neoplasm of breast: Secondary | ICD-10-CM | POA: Diagnosis not present

## 2022-11-10 LAB — HM PAP SMEAR
HM Pap smear: NORMAL
HPV, high-risk: NEGATIVE

## 2022-11-10 LAB — RESULTS CONSOLE HPV: CHL HPV: NEGATIVE

## 2022-11-21 ENCOUNTER — Other Ambulatory Visit: Payer: Self-pay | Admitting: Family Medicine

## 2022-11-23 ENCOUNTER — Encounter: Payer: Self-pay | Admitting: Internal Medicine

## 2022-11-23 ENCOUNTER — Other Ambulatory Visit (HOSPITAL_COMMUNITY): Payer: Self-pay

## 2022-11-23 MED ORDER — VENLAFAXINE HCL ER 75 MG PO CP24
75.0000 mg | ORAL_CAPSULE | Freq: Every day | ORAL | 1 refills | Status: DC
  Filled 2022-11-23: qty 30, 30d supply, fill #0

## 2022-11-26 ENCOUNTER — Other Ambulatory Visit (HOSPITAL_COMMUNITY): Payer: Self-pay

## 2022-11-26 ENCOUNTER — Encounter: Payer: Self-pay | Admitting: Internal Medicine

## 2023-01-25 ENCOUNTER — Other Ambulatory Visit: Payer: Self-pay | Admitting: Internal Medicine

## 2023-01-25 DIAGNOSIS — Z111 Encounter for screening for respiratory tuberculosis: Secondary | ICD-10-CM

## 2023-01-25 NOTE — Progress Notes (Signed)
Quantiferon gold, pt needs this for school

## 2023-02-02 ENCOUNTER — Encounter: Payer: Self-pay | Admitting: Family Medicine

## 2023-02-03 ENCOUNTER — Encounter: Payer: Self-pay | Admitting: Family Medicine

## 2023-02-03 DIAGNOSIS — Z111 Encounter for screening for respiratory tuberculosis: Secondary | ICD-10-CM | POA: Diagnosis not present

## 2023-02-03 LAB — TIQ-MISC

## 2023-03-17 ENCOUNTER — Encounter: Payer: Self-pay | Admitting: Family Medicine

## 2023-03-17 DIAGNOSIS — R1011 Right upper quadrant pain: Secondary | ICD-10-CM

## 2023-03-25 ENCOUNTER — Encounter: Payer: Self-pay | Admitting: Family Medicine

## 2023-03-29 ENCOUNTER — Other Ambulatory Visit (HOSPITAL_COMMUNITY): Payer: Self-pay

## 2023-03-29 MED ORDER — ESCITALOPRAM OXALATE 10 MG PO TABS
10.0000 mg | ORAL_TABLET | Freq: Every day | ORAL | 0 refills | Status: DC
  Filled 2023-03-29: qty 30, 30d supply, fill #0

## 2023-03-30 ENCOUNTER — Other Ambulatory Visit: Payer: Self-pay

## 2023-03-30 ENCOUNTER — Other Ambulatory Visit: Payer: Self-pay | Admitting: Family Medicine

## 2023-03-30 ENCOUNTER — Other Ambulatory Visit (HOSPITAL_COMMUNITY): Payer: Self-pay

## 2023-03-30 ENCOUNTER — Ambulatory Visit
Admission: RE | Admit: 2023-03-30 | Discharge: 2023-03-30 | Disposition: A | Discharge status: Home / Self Care | Payer: Commercial Managed Care - PPO | Source: Ambulatory Visit | Attending: Family Medicine | Admitting: Family Medicine

## 2023-03-30 DIAGNOSIS — R9389 Abnormal findings on diagnostic imaging of other specified body structures: Secondary | ICD-10-CM

## 2023-03-30 DIAGNOSIS — R1011 Right upper quadrant pain: Secondary | ICD-10-CM

## 2023-03-30 DIAGNOSIS — F900 Attention-deficit hyperactivity disorder, predominantly inattentive type: Secondary | ICD-10-CM

## 2023-03-30 MED ORDER — AMPHETAMINE-DEXTROAMPHET ER 15 MG PO CP24
15.0000 mg | ORAL_CAPSULE | ORAL | 0 refills | Status: DC
Start: 2023-03-30 — End: 2023-10-04
  Filled 2023-03-30: qty 90, 90d supply, fill #0

## 2023-03-30 MED ORDER — AMPHETAMINE-DEXTROAMPHET ER 5 MG PO CP24
5.0000 mg | ORAL_CAPSULE | Freq: Every day | ORAL | 0 refills | Status: DC
Start: 2023-03-30 — End: 2023-10-04
  Filled 2023-03-30: qty 10, 10d supply, fill #0
  Filled 2023-03-30: qty 80, 80d supply, fill #0
  Filled 2023-03-30: qty 90, 90d supply, fill #0

## 2023-03-31 ENCOUNTER — Other Ambulatory Visit: Payer: Self-pay

## 2023-04-18 ENCOUNTER — Ambulatory Visit (HOSPITAL_COMMUNITY): Payer: Commercial Managed Care - PPO

## 2023-04-20 IMAGING — MR MR HEAD WO/W CM
12 series · 48 of 48 positions shown · IV contrast (20cc multihance)
Comparison: None.

CLINICAL DATA: Neuro deficit, acute, stroke suspected

EXAM:
MRI HEAD WITHOUT AND WITH CONTRAST
TECHNIQUE: Multiplanar, multiecho pulse sequences of the brain and surrounding
structures were obtained without and with intravenous contrast.
CONTRAST:  20mL MULTIHANCE GADOBENATE DIMEGLUMINE 529 MG/ML IV SOLN

[Series 2: T1 · sagittal · 5.0mm · 0.45mm/px · 2 of 22 slices shown]
[im 1/22]
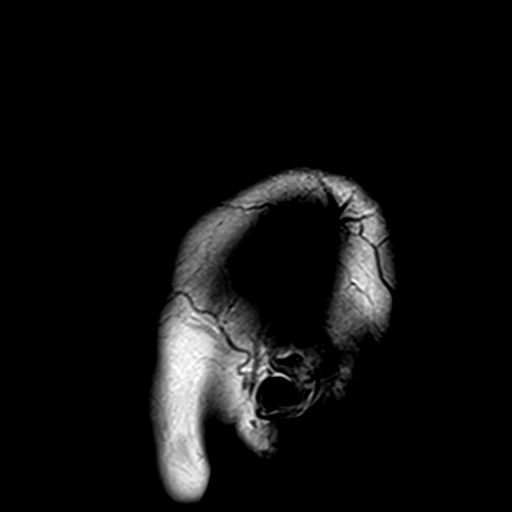
[im 22/22]
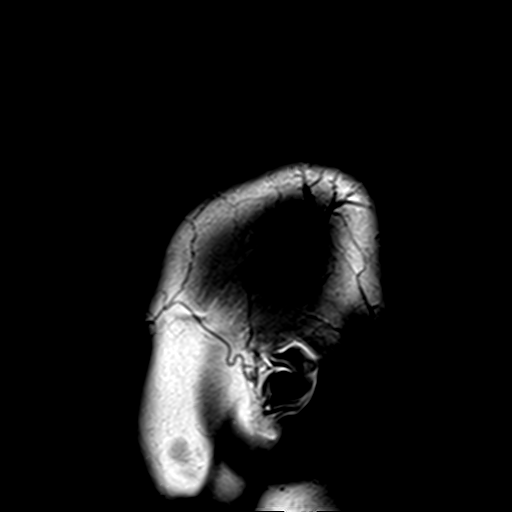

[Series 3: DWI · axial · 3.0mm · 1.80mm/px · z∈[-77,+78]mm · 7 of 106 slices shown (1 of 4)]
[im 1/106]
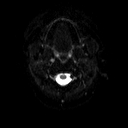
[im 18/106]
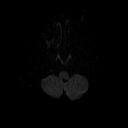
[im 36/106]
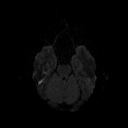
[im 53/106]
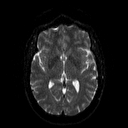
[im 71/106]
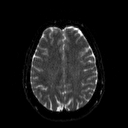
[im 88/106]
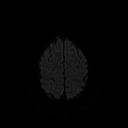
[im 106/106]
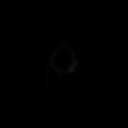

[Series 4: DWI · axial · 3.0mm · 1.80mm/px · z∈[-77,+78]mm · 3 of 53 slices shown (2 of 4)]
[im 1/53]
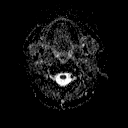
[im 27/53]
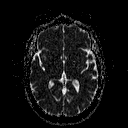
[im 53/53]
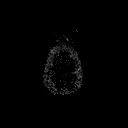

[Series 5: DWI · coronal · 5.0mm · 1.80mm/px · 5 of 72 slices shown (3 of 4)]
[im 1/72]
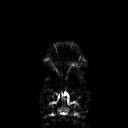
[im 18/72]
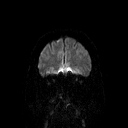
[im 36/72]
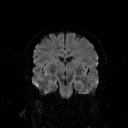
[im 54/72]
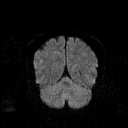
[im 72/72]
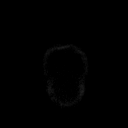

[Series 6: DWI · coronal · 5.0mm · 1.80mm/px · 2 of 36 slices shown (4 of 4)]
[im 1/36]
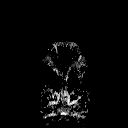
[im 36/36]
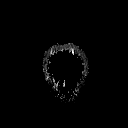

[Series 7: T2 · axial · 5.0mm · 0.60mm/px · z∈[-80,+82]mm · 2 of 25 slices shown (1 of 2)]
[im 1/25]
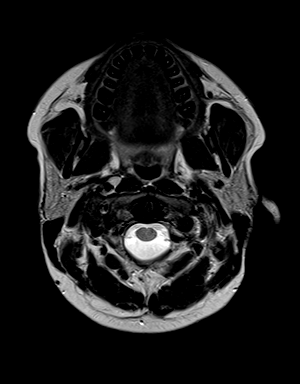
[im 25/25]
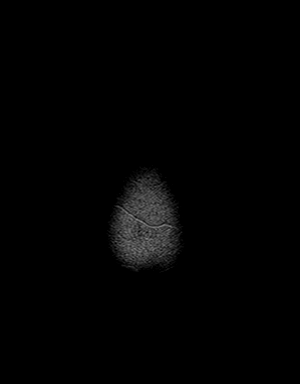

[Series 8: FLAIR · axial · 3.0mm · 0.45mm/px · z∈[-80,+82]mm · 2 of 36 slices shown]
[im 1/36]
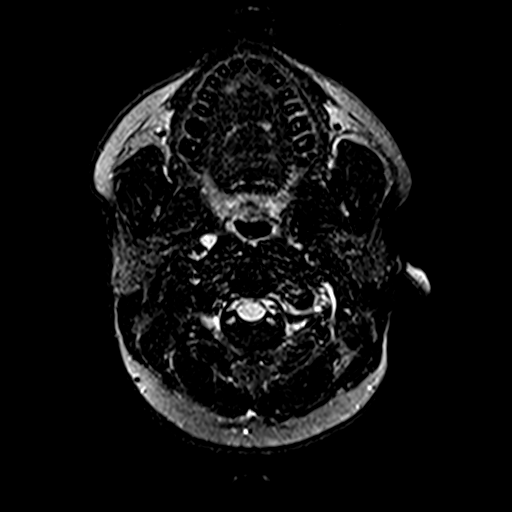
[im 36/36]
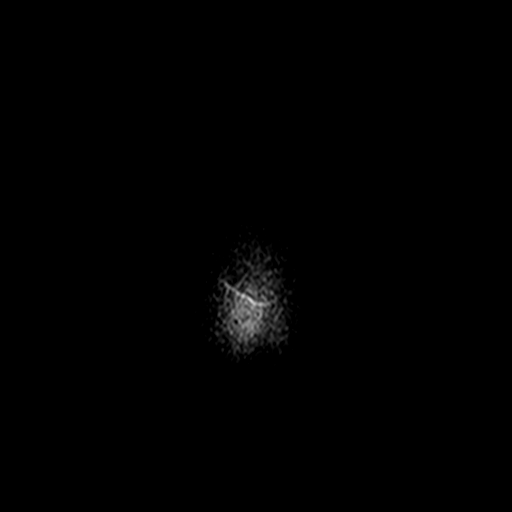

[Series 10: swi_images · axial · 4.0mm · 0.90mm/px · z∈[-77,+78]mm · 3 of 40 slices shown]
[im 1/40]
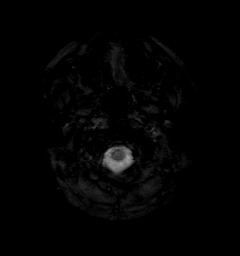
[im 20/40]
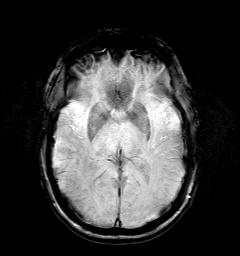
[im 40/40]
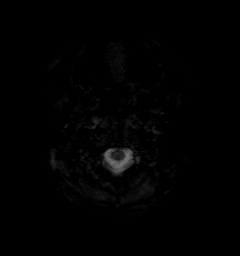

[Series 11: t1_mpr_tra · axial · 1.0mm · 0.75mm/px · z∈[-74,+68]mm · 9 of 144 slices shown]
[im 1/144]
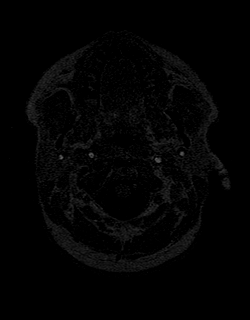
[im 18/144]
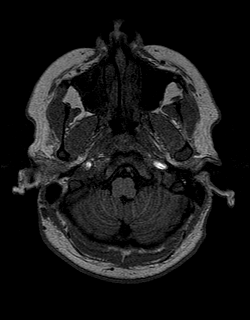
[im 36/144]
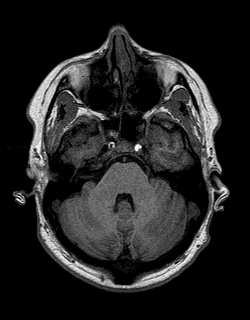
[im 54/144]
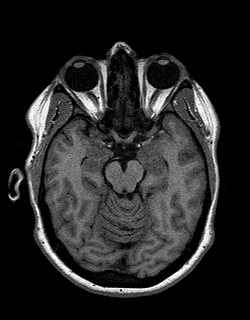
[im 72/144]
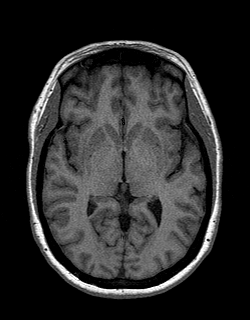
[im 90/144]
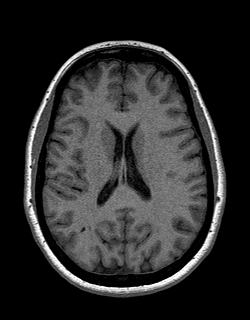
[im 108/144]
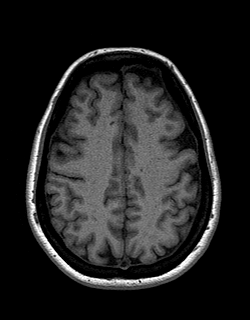
[im 126/144]
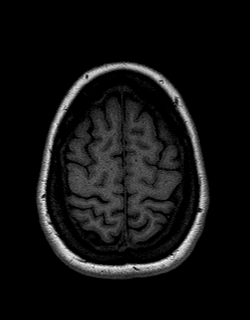
[im 144/144]
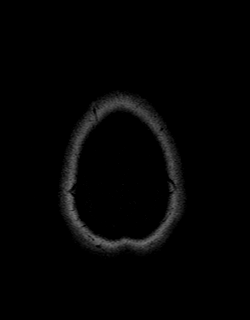

[Series 12: T2 · coronal · 5.0mm · 0.45mm/px · 2 of 28 slices shown (2 of 2)]
[im 1/28]
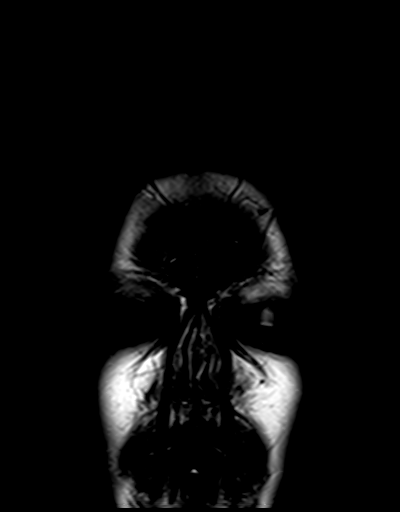
[im 28/28]
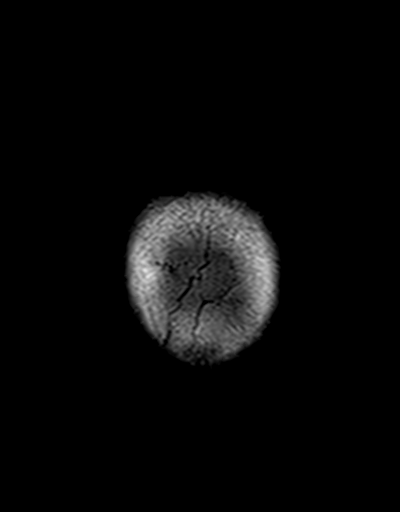

[Series 13: t1_mpr_tra post · axial · 1.0mm · 0.75mm/px · z∈[-74,+68]mm · 9 of 144 slices shown]
[im 1/144]
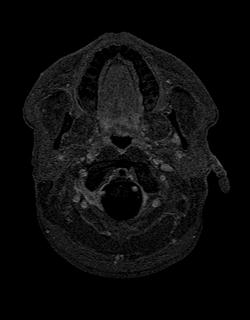
[im 18/144]
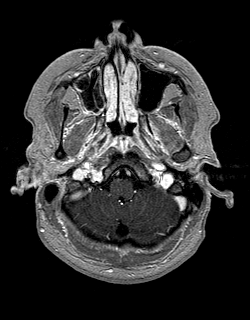
[im 36/144]
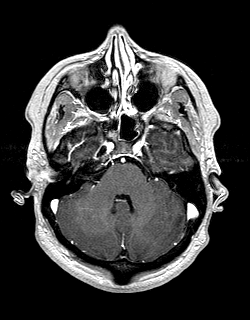
[im 54/144]
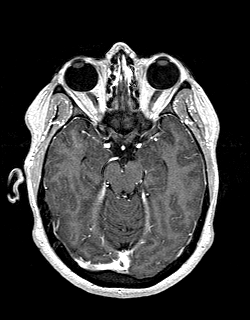
[im 72/144]
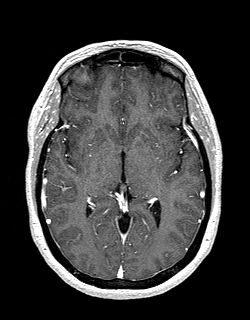
[im 90/144]
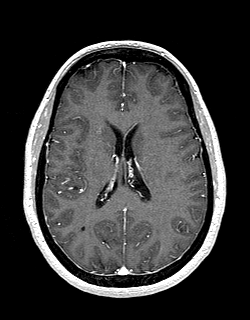
[im 108/144]
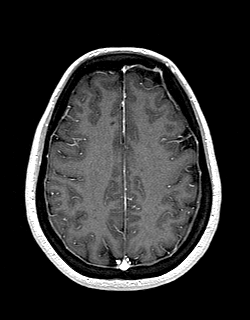
[im 126/144]
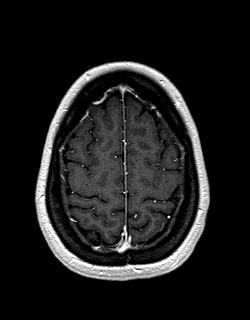
[im 144/144]
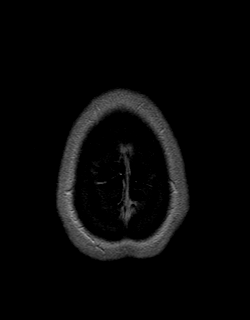

[Series 14: post cor · coronal · 5.0mm · 0.45mm/px · 2 of 28 slices shown]
[im 1/28]
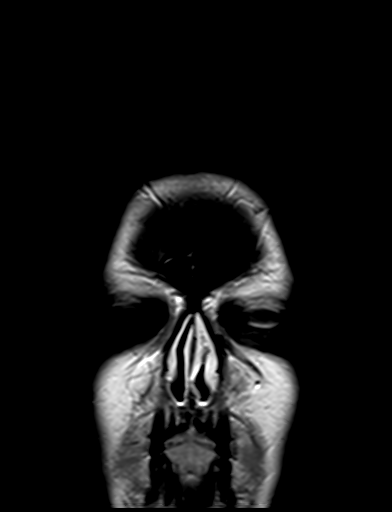
[im 28/28]
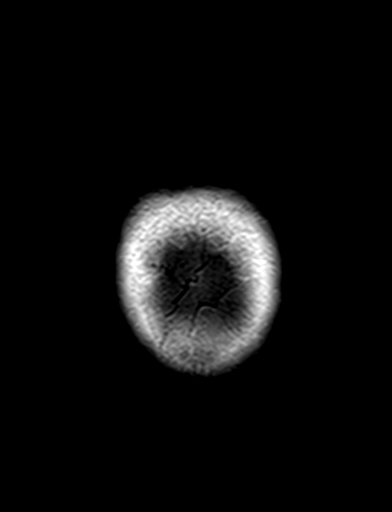

[48 of 48 positions shown; findings below may reference images not displayed]

FINDINGS: Brain: No acute infarction, hemorrhage, hydrocephalus, extra-axial
collection or mass lesion. No abnormal enhancement.

Vascular: Major arterial flow voids are maintained at the skull
base.

Skull and upper cervical spine: Normal marrow signal.

Sinuses/Orbits: Moderate mucosal thickening of the right maxillary
sinus and scattered ethmoid air cells. Left maxillary sinus
retention cyst.

Other: No mastoid effusions.
IMPRESSION: 1. No evidence of acute intracranial abnormality.
2. Moderate right maxillary and ethmoid air cell mucosal thickening.

## 2023-04-22 ENCOUNTER — Other Ambulatory Visit: Payer: Self-pay | Admitting: Family Medicine

## 2023-04-22 NOTE — Telephone Encounter (Signed)
Last apt 08/31/22.

## 2023-04-25 ENCOUNTER — Other Ambulatory Visit: Payer: Self-pay | Admitting: Family Medicine

## 2023-04-26 ENCOUNTER — Other Ambulatory Visit (HOSPITAL_COMMUNITY): Payer: Self-pay

## 2023-04-26 MED ORDER — ESCITALOPRAM OXALATE 10 MG PO TABS
10.0000 mg | ORAL_TABLET | Freq: Every day | ORAL | 1 refills | Status: DC
  Filled 2023-04-26: qty 30, 30d supply, fill #0
  Filled 2023-05-23: qty 30, 30d supply, fill #1

## 2023-04-26 NOTE — Telephone Encounter (Signed)
I gave her a month supply on 03/29/2023 with directions to call me in a month and let me know how she is doing. She needs to provide this information before refilling to know if she needs an increase.

## 2023-04-26 NOTE — Telephone Encounter (Signed)
Is this okay to refill? 

## 2023-04-26 NOTE — Telephone Encounter (Signed)
Spoke with patient and she is doing well on the 10mg  of Lexapro. She thought about going up on the dose but has lots of things going on this time of year Nov/Dec and would prefer to hold steady on the dose for now and re address later.

## 2023-04-26 NOTE — Addendum Note (Signed)
Addended by: Ronnald Nian on: 04/26/2023 05:11 PM   Modules accepted: Orders

## 2023-04-27 ENCOUNTER — Other Ambulatory Visit (HOSPITAL_COMMUNITY): Payer: Self-pay

## 2023-04-28 ENCOUNTER — Other Ambulatory Visit (HOSPITAL_COMMUNITY): Payer: Self-pay

## 2023-05-04 ENCOUNTER — Encounter (HOSPITAL_COMMUNITY)
Admission: RE | Admit: 2023-05-04 | Discharge: 2023-05-04 | Disposition: A | Discharge status: Home / Self Care | Payer: Commercial Managed Care - PPO | Source: Ambulatory Visit | Attending: Family Medicine | Admitting: Family Medicine

## 2023-05-04 DIAGNOSIS — R9389 Abnormal findings on diagnostic imaging of other specified body structures: Secondary | ICD-10-CM

## 2023-05-04 DIAGNOSIS — R109 Unspecified abdominal pain: Secondary | ICD-10-CM | POA: Diagnosis not present

## 2023-05-04 DIAGNOSIS — R1011 Right upper quadrant pain: Secondary | ICD-10-CM | POA: Diagnosis not present

## 2023-05-04 MED ORDER — TECHNETIUM TC 99M MEBROFENIN IV KIT
5.3000 | PACK | Freq: Once | INTRAVENOUS | Status: AC | PRN
Start: 2023-05-04 — End: 2023-05-04
  Administered 2023-05-04: 5.3 via INTRAVENOUS

## 2023-06-02 ENCOUNTER — Other Ambulatory Visit: Payer: Self-pay | Admitting: Internal Medicine

## 2023-06-02 DIAGNOSIS — Z0000 Encounter for general adult medical examination without abnormal findings: Secondary | ICD-10-CM

## 2023-06-02 DIAGNOSIS — Z8639 Personal history of other endocrine, nutritional and metabolic disease: Secondary | ICD-10-CM

## 2023-06-02 DIAGNOSIS — E785 Hyperlipidemia, unspecified: Secondary | ICD-10-CM

## 2023-06-03 ENCOUNTER — Other Ambulatory Visit: Payer: Commercial Managed Care - PPO

## 2023-06-08 ENCOUNTER — Ambulatory Visit (INDEPENDENT_AMBULATORY_CARE_PROVIDER_SITE_OTHER): Payer: Commercial Managed Care - PPO | Admitting: Family Medicine

## 2023-06-08 ENCOUNTER — Other Ambulatory Visit: Payer: Commercial Managed Care - PPO

## 2023-06-08 VITALS — BP 116/74 | HR 84 | Ht 64.0 in | Wt 238.8 lb

## 2023-06-08 DIAGNOSIS — F341 Dysthymic disorder: Secondary | ICD-10-CM

## 2023-06-08 DIAGNOSIS — F900 Attention-deficit hyperactivity disorder, predominantly inattentive type: Secondary | ICD-10-CM

## 2023-06-08 DIAGNOSIS — E782 Mixed hyperlipidemia: Secondary | ICD-10-CM | POA: Diagnosis not present

## 2023-06-08 DIAGNOSIS — E785 Hyperlipidemia, unspecified: Secondary | ICD-10-CM

## 2023-06-08 DIAGNOSIS — E669 Obesity, unspecified: Secondary | ICD-10-CM | POA: Diagnosis not present

## 2023-06-08 DIAGNOSIS — Z0000 Encounter for general adult medical examination without abnormal findings: Secondary | ICD-10-CM | POA: Diagnosis not present

## 2023-06-08 DIAGNOSIS — M545 Low back pain, unspecified: Secondary | ICD-10-CM | POA: Diagnosis not present

## 2023-06-08 DIAGNOSIS — K219 Gastro-esophageal reflux disease without esophagitis: Secondary | ICD-10-CM | POA: Diagnosis not present

## 2023-06-08 DIAGNOSIS — G8929 Other chronic pain: Secondary | ICD-10-CM | POA: Diagnosis not present

## 2023-06-08 DIAGNOSIS — Z8639 Personal history of other endocrine, nutritional and metabolic disease: Secondary | ICD-10-CM | POA: Diagnosis not present

## 2023-06-08 NOTE — Progress Notes (Signed)
Complete physical exam  Patient: Jasmine Ortega   DOB: [birth year]   42 y.o. Female  MRN: [id number]  Subjective:    Chief Complaint  Patient presents with   Annual Exam    Jasmine Ortega is a 42 y.o. female who presents today for a complete physical exam. She reports consuming a general diet. The patient does not participate in regular exercise at present. She generally feels fairly well. She reports sleeping fairly well. She does not have additional problems to discuss today.  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  She continues to do well on her ADHD medications.  The 15 mg lasts roughly 8 hours and she sometimes needs the 5 mg on an as-needed basis.  She continues on Lexapro which seems to be keeping things fairly stable.  She has very little difficulty with reflux.  In the past she did try GLP to help with weight but had difficulty with vomiting.  She continues to deal with back pain.  She gets regular gynecologic evaluations and is on an IUD.  Her 2 children are being evaluated for possible autism.   Most recent fall risk assessment:    06/08/2023    2:55 PM  Fall Risk   Falls in the past year? 0  Number falls in past yr: 0  Injury with Fall? 0     Most recent depression screenings:    06/08/2023    2:55 PM 07/07/2022    8:15 AM  PHQ 2/9 Scores  PHQ - 2 Score 0 0  PHQ- 9 Score  6    Vision:Within last year and Dental: No current dental problems and Last dental visit: November 2024    Patient Care Team: Ronnald Nian, MD as PCP - General (Family Medicine) Ob/Gyn, [facility]   Outpatient Medications Prior to Visit  Medication Sig   amphetamine-dextroamphetamine (ADDERALL XR) 15 MG 24 hr capsule Take 1 capsule by mouth every morning.   amphetamine-dextroamphetamine (ADDERALL XR) 5 MG 24 hr capsule Take 1 capsule (5 mg total) by mouth daily.   Cholecalciferol (VITAMIN D) 125 MCG (5000 UT) CAPS    escitalopram (LEXAPRO) 10 MG tablet Take 1 tablet (10 mg total) by mouth  daily.   levonorgestrel (MIRENA, 52 MG,) 20 MCG/24HR IUD Mirena 20 mcg/24 hours (5 yrs) 52 mg intrauterine device  Take 1 device by intrauterine route.   Omega 3 1000 MG CAPS    cyclobenzaprine (FLEXERIL) 10 MG tablet Take 1 tablet (10 mg total) by mouth 3 (three) times daily as needed for muscle spasms. (Patient not taking: Reported on 08/31/2022)   Dapsone 5 % topical gel Apply to acne nightly (Patient not taking: Reported on 08/31/2022)   meclizine (ANTIVERT) 25 MG tablet Take 1/2 tablet (12.5 mg total) by mouth 3 (three) times daily as needed for dizziness. (Patient not taking: Reported on 08/31/2022)   omeprazole (PRILOSEC) 20 MG capsule Take 20 mg by mouth daily. (Patient not taking: Reported on 08/31/2022)   No facility-administered medications prior to visit.    Review of Systems  All other systems reviewed and are negative.         Objective:       Physical Exam  Alert and in no distress. Tympanic membranes and canals are normal. Pharyngeal area is normal. Neck is supple without adenopathy or thyromegaly. Cardiac exam shows a regular sinus rhythm without murmurs or gallops. Lungs are clear to auscultation.      Assessment & Plan:  Routine general medical examination at a health care facility  Attention deficit hyperactivity disorder (ADHD), predominantly inattentive type  Chronic low back pain without sciatica, unspecified back pain laterality  Dysthymia  Gastroesophageal reflux disease without esophagitis  Mixed hyperlipidemia  Obesity (BMI 30-39.9) - Plan: Amb Ref to Medical Weight Management   Immunization History  Administered Date(s) Administered   Influenza Split 03/12/2014, 02/16/2022   Influenza,inj,Quad PF,6+ Mos 03/28/2018   Influenza-Unspecified 03/26/2015, 03/28/2016, 03/10/2017, 03/03/2018, 03/21/2019, 02/20/2020, 03/06/2021, 03/08/2023   PFIZER(Purple Top)SARS-COV-2 Vaccination 06/18/2019, 07/07/2019, 03/28/2020   Pfizer Covid-19 Vaccine Bivalent  Booster 43yrs & up 03/24/2021   Tdap 06/29/2003, 01/07/2016, 07/21/2016   Unspecified SARS-COV-2 Vaccination 06/18/2019, 07/07/2019, 03/28/2020    Health Maintenance  Topic Date Due   COVID-19 Vaccine (8 - 2023-24 season) 02/27/2023   Hepatitis C Screening  06/07/2024 (Originally 08/23/1997)   DTaP/Tdap/Td (4 - Td or Tdap) 07/21/2026   Cervical Cancer Screening (HPV/Pap Cotest)  11/10/2027   INFLUENZA VACCINE  Completed   HIV Screening  Completed   HPV VACCINES  Aged Out    Discussed her weight with her and I will send her to medical weight loss and wellness.  Otherwise she will continue on her present medication regimen and call me when she needs a refill on her ADD medicine. Problem List Items Addressed This Visit     Attention deficit hyperactivity disorder (ADHD), predominantly inattentive type   Chronic low back pain without sciatica   Dysthymia   Gastroesophageal reflux disease   Hyperlipidemia   Obesity (BMI 30-39.9)   Relevant Orders   Amb Ref to Medical Weight Management   Other Visit Diagnoses     Routine general medical examination at a health care facility    -  Primary      No follow-ups on file.     Sharlot Gowda, MD

## 2023-06-09 LAB — LIPID PANEL
Chol/HDL Ratio: 3.3 ratio (ref 0.0–4.4)
Cholesterol, Total: 211 mg/dL — ABNORMAL HIGH (ref 100–199)
HDL: 64 mg/dL
LDL Chol Calc (NIH): 127 mg/dL — ABNORMAL HIGH (ref 0–99)
Triglycerides: 116 mg/dL (ref 0–149)
VLDL Cholesterol Cal: 20 mg/dL (ref 5–40)

## 2023-06-09 LAB — CBC WITH DIFFERENTIAL/PLATELET
Basophils Absolute: 0.1 x10E3/uL (ref 0.0–0.2)
Basos: 1 %
EOS (ABSOLUTE): 0.2 x10E3/uL (ref 0.0–0.4)
Eos: 3 %
Hematocrit: 41.5 % (ref 34.0–46.6)
Hemoglobin: 13.7 g/dL (ref 11.1–15.9)
Immature Grans (Abs): 0.1 x10E3/uL (ref 0.0–0.1)
Immature Granulocytes: 1 %
Lymphocytes Absolute: 2.1 x10E3/uL (ref 0.7–3.1)
Lymphs: 27 %
MCH: 28.7 pg (ref 26.6–33.0)
MCHC: 33 g/dL (ref 31.5–35.7)
MCV: 87 fL (ref 79–97)
Monocytes Absolute: 0.6 x10E3/uL (ref 0.1–0.9)
Monocytes: 8 %
Neutrophils Absolute: 4.8 x10E3/uL (ref 1.4–7.0)
Neutrophils: 60 %
Platelets: 265 x10E3/uL (ref 150–450)
RBC: 4.78 x10E6/uL (ref 3.77–5.28)
RDW: 13 % (ref 11.7–15.4)
WBC: 8 x10E3/uL (ref 3.4–10.8)

## 2023-06-09 LAB — COMPREHENSIVE METABOLIC PANEL WITH GFR
ALT: 31 IU/L (ref 0–32)
AST: 19 IU/L (ref 0–40)
Albumin: 4.2 g/dL (ref 3.9–4.9)
Alkaline Phosphatase: 74 IU/L (ref 44–121)
BUN/Creatinine Ratio: 19 (ref 9–23)
BUN: 14 mg/dL (ref 6–24)
Bilirubin Total: 0.6 mg/dL (ref 0.0–1.2)
CO2: 23 mmol/L (ref 20–29)
Calcium: 9 mg/dL (ref 8.7–10.2)
Chloride: 100 mmol/L (ref 96–106)
Creatinine, Ser: 0.75 mg/dL (ref 0.57–1.00)
Globulin, Total: 2.3 g/dL (ref 1.5–4.5)
Glucose: 108 mg/dL — ABNORMAL HIGH (ref 70–99)
Potassium: 4.8 mmol/L (ref 3.5–5.2)
Sodium: 138 mmol/L (ref 134–144)
Total Protein: 6.5 g/dL (ref 6.0–8.5)
eGFR: 101 mL/min/1.73

## 2023-06-09 LAB — HEMOGLOBIN A1C
Est. average glucose Bld gHb Est-mCnc: 123 mg/dL
Hgb A1c MFr Bld: 5.9 % — ABNORMAL HIGH (ref 4.8–5.6)

## 2023-06-09 LAB — TSH: TSH: 2.65 u[IU]/mL (ref 0.450–4.500)

## 2023-06-21 ENCOUNTER — Other Ambulatory Visit: Payer: Self-pay | Admitting: Family Medicine

## 2023-06-22 ENCOUNTER — Other Ambulatory Visit (HOSPITAL_COMMUNITY): Payer: Self-pay

## 2023-06-22 MED ORDER — ESCITALOPRAM OXALATE 10 MG PO TABS
10.0000 mg | ORAL_TABLET | Freq: Every day | ORAL | 1 refills | Status: DC
  Filled 2023-06-22: qty 90, 90d supply, fill #0
  Filled 2023-09-19: qty 90, 90d supply, fill #1

## 2023-06-23 ENCOUNTER — Other Ambulatory Visit (HOSPITAL_COMMUNITY): Payer: Self-pay

## 2023-06-24 ENCOUNTER — Ambulatory Visit: Payer: Commercial Managed Care - PPO | Admitting: Dermatology

## 2023-06-24 ENCOUNTER — Encounter: Payer: Self-pay | Admitting: Dermatology

## 2023-06-24 ENCOUNTER — Other Ambulatory Visit (HOSPITAL_COMMUNITY): Payer: Self-pay

## 2023-06-24 VITALS — BP 130/81 | HR 80

## 2023-06-24 DIAGNOSIS — D492 Neoplasm of unspecified behavior of bone, soft tissue, and skin: Secondary | ICD-10-CM

## 2023-06-24 MED ORDER — HYDROCORTISONE 2.5 % EX CREA
TOPICAL_CREAM | Freq: Two times a day (BID) | CUTANEOUS | 11 refills | Status: AC | PRN
  Filled 2023-06-24: qty 30, 10d supply, fill #0

## 2023-06-24 NOTE — Patient Instructions (Signed)

## 2023-06-24 NOTE — Progress Notes (Signed)
   New Patient Visit   Subjective  Jasmine Ortega is a 42 y.o. female who presents for the following: growth on forehead.  Pt has growth on forehead around 3 weeks that won't go away, not previously treated, no history of trauma. No hx of skin cancer or family hx.  The following portions of the chart were reviewed this encounter and updated as appropriate: medications, allergies, medical history  Review of Systems:  No other skin or systemic complaints except as noted in HPI or Assessment and Plan.  Objective  Well appearing patient in no apparent distress; mood and affect are within normal limits.  A focused examination was performed of the following areas: forehead  Relevant exam findings are noted in the Assessment and Plan.    Assessment & Plan   Pink Nodule on Left Forehead- Ddx Granuloma Annulare vs Inflammatory Papule vs NMSC vs other - Discussed opt of monitoring for changes since lesion is recent - Discussed opt of treating as GA with low potency steroid and if it doesn't improve, will bx at next visit, patient agrees - hydrocortisone 2.5 % cream; Apply topically 2 (two) times daily as needed (Rash).  Dispense: 30 g; Refill: 11    Return in about 4 weeks (around 07/22/2023) for spot check on forehead, TBSE.  I, Tillie Fantasia, CMA, am acting as scribe for Gwenith Daily, MD.   Documentation: I have reviewed the above documentation for accuracy and completeness, and I agree with the above.  Gwenith Daily, MD

## 2023-07-12 ENCOUNTER — Other Ambulatory Visit: Payer: Self-pay | Admitting: Medical Genetics

## 2023-07-20 ENCOUNTER — Ambulatory Visit: Payer: Commercial Managed Care - PPO | Admitting: Dermatology

## 2023-07-27 DIAGNOSIS — F331 Major depressive disorder, recurrent, moderate: Secondary | ICD-10-CM | POA: Diagnosis not present

## 2023-08-03 ENCOUNTER — Ambulatory Visit: Payer: Commercial Managed Care - PPO | Admitting: Dermatology

## 2023-08-03 ENCOUNTER — Encounter: Payer: Self-pay | Admitting: Dermatology

## 2023-08-03 ENCOUNTER — Other Ambulatory Visit (HOSPITAL_COMMUNITY): Payer: Self-pay

## 2023-08-03 ENCOUNTER — Other Ambulatory Visit (INDEPENDENT_AMBULATORY_CARE_PROVIDER_SITE_OTHER): Payer: Commercial Managed Care - PPO

## 2023-08-03 ENCOUNTER — Ambulatory Visit: Payer: Commercial Managed Care - PPO | Admitting: Physician Assistant

## 2023-08-03 VITALS — BP 115/74 | HR 91

## 2023-08-03 DIAGNOSIS — L578 Other skin changes due to chronic exposure to nonionizing radiation: Secondary | ICD-10-CM | POA: Diagnosis not present

## 2023-08-03 DIAGNOSIS — W908XXA Exposure to other nonionizing radiation, initial encounter: Secondary | ICD-10-CM | POA: Diagnosis not present

## 2023-08-03 DIAGNOSIS — L853 Xerosis cutis: Secondary | ICD-10-CM

## 2023-08-03 DIAGNOSIS — D2372 Other benign neoplasm of skin of left lower limb, including hip: Secondary | ICD-10-CM

## 2023-08-03 DIAGNOSIS — D239 Other benign neoplasm of skin, unspecified: Secondary | ICD-10-CM

## 2023-08-03 DIAGNOSIS — M25562 Pain in left knee: Secondary | ICD-10-CM | POA: Diagnosis not present

## 2023-08-03 DIAGNOSIS — D2371 Other benign neoplasm of skin of right lower limb, including hip: Secondary | ICD-10-CM

## 2023-08-03 DIAGNOSIS — Z1283 Encounter for screening for malignant neoplasm of skin: Secondary | ICD-10-CM | POA: Diagnosis not present

## 2023-08-03 DIAGNOSIS — D229 Melanocytic nevi, unspecified: Secondary | ICD-10-CM

## 2023-08-03 DIAGNOSIS — D492 Neoplasm of unspecified behavior of bone, soft tissue, and skin: Secondary | ICD-10-CM

## 2023-08-03 DIAGNOSIS — L814 Other melanin hyperpigmentation: Secondary | ICD-10-CM

## 2023-08-03 DIAGNOSIS — D1801 Hemangioma of skin and subcutaneous tissue: Secondary | ICD-10-CM

## 2023-08-03 DIAGNOSIS — S83242D Other tear of medial meniscus, current injury, left knee, subsequent encounter: Secondary | ICD-10-CM | POA: Diagnosis not present

## 2023-08-03 DIAGNOSIS — L821 Other seborrheic keratosis: Secondary | ICD-10-CM

## 2023-08-03 NOTE — Progress Notes (Signed)
 Office Visit Note   Patient: Jasmine Ortega           Date of Birth: Apr 25, 1981           MRN: [id number] Visit Date: 08/03/2023              Requested by: Joyce Norleen BROCKS, MD [address] [place],  [place] [address] PCP: Joyce Norleen BROCKS, MD   Assessment & Plan: Visit Diagnoses:  1. Acute pain of left knee   2. Other tear of medial meniscus, current injury, left knee, subsequent encounter     Plan: Patient is a pleasant 43 year old woman who has a 6-week history of left knee popping catching and giving way after twisting it during a jujitsu class.  She did treat this conservatively with ice and rest.  She tried to go back to work last this past Monday and again had a popping in her left knee which made it unable for her to continue and felt like her knee was getting give way.  No previous history of knee issues.  Pain is particularly noted with twisting her knee.  She has difficulty with terminal flexion and extension.  Because of the mechanical symptoms and concern for an acute meniscus tear and would like her to get an MRI.  If her MRI did demonstrate meniscus pathology would refer her to Dr. Addie.  X-rays today do not show any acute osseous injuries  Follow-Up Instructions: Will call after MRI  Orders:  Orders Placed This Encounter  Procedures   XR Knee 1-2 Views Left   MR Knee Left w/ contrast   No orders of the defined types were placed in this encounter.     Procedures: No procedures performed   Clinical Data: No additional findings.   Subjective: Chief Complaint  Patient presents with   Left Knee - Pain    HPI Jasmine Ortega is a pleasant 43 year old woman who presents today with a chief complaint of left knee pain.  She says she knee buckled at karate class on Monday.  She has pain in the back of the knee and popping.  She denies any falls she works as a publishing rights manager  Review of Systems  All other systems reviewed and are negative.    Objective: Vital  Signs: There were no vitals taken for this visit.  Physical Exam Constitutional:      Appearance: Normal appearance.  Pulmonary:     Effort: Pulmonary effort is normal.  Skin:    General: Skin is warm and dry.  Neurological:     General: No focal deficit present.     Mental Status: She is alert and oriented to person, place, and time.  Psychiatric:        Mood and Affect: Mood normal.        Behavior: Behavior normal.     Ortho Exam Left knee no effusion no erythema compartments are soft and compressible she is neurovascularly intact.  She has pain both over the medial lateral joint line pain with terminal extension and flexion and with McMurray's maneuver.  Slightly softer endpoint on anterior draw than the right knee no patellar apprehension.  She has good extension and flexion strength has some tenderness over the quad tendon but no defect and is able to sustain extension against resistance. Specialty Comments:  No specialty comments available.  Imaging: XR Knee 1-2 Views Left Result Date: 08/03/2023 Radiographs of the left knee demonstrate well-maintained alignment well-preserved joint spacing no evidence of  any degenerative changes    PMFS History: Patient Active Problem List   Diagnosis Date Noted   Pain in left knee 08/03/2023   Chronic low back pain without sciatica 09/23/2021   Dysthymia 09/23/2021   Obesity (BMI 30-39.9) 09/14/2019   Hyperlipidemia 09/14/2019   Gastroesophageal reflux disease 04/27/2017   Attention deficit hyperactivity disorder (ADHD), predominantly inattentive type 03/05/2015   Loss of transverse plantar arch of right foot 09/18/2014   Past Medical History:  Diagnosis Date   Birth control    Chicken pox    Depression    GERD (gastroesophageal reflux disease)    Gestational diabetes     Family History  Problem Relation Age of Onset   Breast cancer Mother    Cancer Mother 5       Breast cancer   Neuropathy Father    Arthritis Father     Hyperlipidemia Father    Hypertension Father    Stroke Father    Migraines Sister    Mitral valve prolapse Sister    Stroke Maternal Grandmother    Miscarriages / Stillbirths Maternal Grandfather    Arthritis Paternal Grandmother    Heart disease Paternal Grandmother    Diabetes Paternal Grandfather     No past surgical history on file. Social History   Occupational History   Not on file  Tobacco Use   Smoking status: Never   Smokeless tobacco: Never  Vaping Use   Vaping status: Never Used  Substance and Sexual Activity   Alcohol use: Yes    Alcohol/week: 2.0 standard drinks of alcohol    Types: 2 Glasses of wine per week   Drug use: No   Sexual activity: Yes

## 2023-08-03 NOTE — Progress Notes (Signed)
   Follow-Up Visit   Subjective  Jasmine Ortega is a 43 y.o. female who presents for the following: Skin Cancer Screening and Full Body Skin Exam  The patient presents for Total-Body Skin Exam (TBSE) for skin cancer screening and mole check. The patient has spots, moles and lesions to be evaluated, some may be new or changing.  Pt has no hx of skin cancer no family hx.  The following portions of the chart were reviewed this encounter and updated as appropriate: medications, allergies, medical history  Review of Systems:  No other skin or systemic complaints except as noted in HPI or Assessment and Plan.  Objective  Well appearing patient in no apparent distress; mood and affect are within normal limits.  A full examination was performed including scalp, head, eyes, ears, nose, lips, neck, chest, axillae, abdomen, back, buttocks, bilateral upper extremities, bilateral lower extremities, hands, feet, fingers, toes, fingernails, and toenails. All findings within normal limits unless otherwise noted below.   Relevant physical exam findings are noted in the Assessment and Plan.    Assessment & Plan   SKIN CANCER SCREENING PERFORMED TODAY.  Pink Nodule on Left Forehead- Ddx Granuloma Annulare vs Inflammatory Papule vs NMSC vs other- Resolved -Resolved with hydrocortisone  - Will continue to monitor for changes  ACTINIC DAMAGE - Chronic condition, secondary to cumulative UV/sun exposure - diffuse scaly erythematous macules with underlying dyspigmentation - Recommend daily broad spectrum sunscreen SPF 30+ to sun-exposed areas, reapply every 2 hours as needed.  - Staying in the shade or wearing long sleeves, sun glasses (UVA+UVB protection) and wide brim hats (4-inch brim around the entire circumference of the hat) are also recommended for sun protection.  - Call for new or changing lesions.  MELANOCYTIC NEVI - Tan-brown and/or pink-flesh-colored symmetric macules and papules - Benign  appearing on exam today - Observation - Call clinic for new or changing moles - Recommend daily use of broad spectrum spf 30+ sunscreen to sun-exposed areas.   SEBORRHEIC KERATOSIS - Stuck-on, waxy, tan-brown papules and/or plaques  - Benign-appearing - Discussed benign etiology and prognosis. - Observe - Call for any changes  LENTIGINES Exam: scattered tan macules Due to sun exposure Treatment Plan: Benign-appearing, observe. Recommend daily broad spectrum sunscreen SPF 30+ to sun-exposed areas, reapply every 2 hours as needed.  Call for any changes    HEMANGIOMA Exam: red papule(s) Discussed benign nature. Recommend observation. Call for changes.   DERMATOFIBROMA bilateral outer upper thigh Exam: Firm pink/brown papulenodule with dimple sign. Treatment Plan: A dermatofibroma is a benign growth possibly related to trauma, such as an insect bite, cut from shaving, or inflamed acne-type bump.  Treatment options to remove include shave or excision with resulting scar and risk of recurrence.  Since benign-appearing and not bothersome, will observe for now.    Xerosis- Palmoplantar- Chronic condition flaring, not at treatment goal, acutely flaring - diffuse xerotic patches - recommend gentle, hydrating skin care - gentle skin care handout given - Discussed that areas do not appear consistent with tinea pedis  Return in about 1 year (around 08/02/2024) for TBSE.  I, Darice Smock, CMA, am acting as scribe for RUFUS CHRISTELLA HOLY, MD.   Documentation: I have reviewed the above documentation for accuracy and completeness, and I agree with the above.  RUFUS CHRISTELLA HOLY, MD

## 2023-08-03 NOTE — Progress Notes (Deleted)
   Follow-Up Visit   Subjective  Jasmine Ortega is a 43 y.o. female who presents for the following: spot on left forehead. Pt has spot on left forehead that was evaluated on 06/24/23.   The following portions of the chart were reviewed this encounter and updated as appropriate: medications, allergies, medical history  Review of Systems:  No other skin or systemic complaints except as noted in HPI or Assessment and Plan.  Objective  Well appearing patient in no apparent distress; mood and affect are within normal limits.  A focused examination was performed of the following areas: Left forehead  Relevant exam findings are noted in the Assessment and Plan.    Assessment & Plan       No follow-ups on file.  I, Darice Smock, CMA, am acting as scribe for RUFUS CHRISTELLA HOLY, MD.   Documentation: I have reviewed the above documentation for accuracy and completeness, and I agree with the above.  RUFUS CHRISTELLA HOLY, MD

## 2023-08-03 NOTE — Patient Instructions (Addendum)
 Q-Switched laser for tattoo removal Dr. Beverley Czar Cox  [phone]   Skin Education : We counseled the patient regarding the following: Sun screen (SPF 30 or greater) should be applied during peak UV exposure (between 10am and 2pm) and reapplied after exercise or swimming.  The ABCDEs of melanoma were reviewed with the patient, and the importance of monthly self-examination of moles was emphasized. Should any moles change in shape or color, or itch, bleed or burn, pt will contact our office for evaluation sooner then their interval appointment.  Plan: Sunscreen Recommendations We recommended a broad spectrum sunscreen with a SPF of 30 or higher. SPF 30 sunscreens block approximately 97 percent of the sun's harmful rays. Sunscreens should be applied at least 15 minutes prior to expected sun exposure and then every 2 hours after that as long as sun exposure continues. If swimming or exercising sunscreen should be reapplied every 45 minutes to an hour after getting wet or sweating. One ounce, or the equivalent of a shot glass full of sunscreen, is adequate to protect the skin not covered by a bathing suit.We also recommended a lip balm with a sunscreen as well. Sun protective clothing can be used in lieu of sunscreen but must be worn the entire time you are exposed to the sun's rays.   Important Information   Due to recent changes in healthcare laws, you may see results of your pathology and/or laboratory studies on MyChart before the doctors have had a chance to review them. We understand that in some cases there may be results that are confusing or concerning to you. Please understand that not all results are received at the same time and often the doctors may need to interpret multiple results in order to provide you with the best plan of care or course of treatment. Therefore, we ask that you please give us  2 business days to thoroughly review all your results before contacting the office for  clarification. Should we see a critical lab result, you will be contacted sooner.     If You Need Anything After Your Visit   If you have any questions or concerns for your doctor, please call our main line at [phone]. If no one answers, please leave a voicemail as directed and we will return your call as soon as possible. Messages left after 4 pm will be answered the following business day.    You may also send us  a message via MyChart. We typically respond to MyChart messages within 1-2 business days.  For prescription refills, please ask your pharmacy to contact our office. Our fax number is [phone].  If you have an urgent issue when the clinic is closed that cannot wait until the next business day, you can page your doctor at the number below.     Please note that while we do our best to be available for urgent issues outside of office hours, we are not available 24/7.    If you have an urgent issue and are unable to reach us , you may choose to seek medical care at your doctor's office, retail clinic, urgent care center, or emergency room.   If you have a medical emergency, please immediately call 911 or go to the emergency department. In the event of inclement weather, please call our main line at [phone] for an update on the status of any delays or closures.  Dermatology Medication Tips: Please keep the boxes that topical medications come in in order to help keep  track of the instructions about where and how to use these. Pharmacies typically print the medication instructions only on the boxes and not directly on the medication tubes.   If your medication is too expensive, please contact our office at [phone] or send us  a message through MyChart.    We are unable to tell what your co-pay for medications will be in advance as this is different depending on your insurance coverage. However, we may be able to find a substitute medication at lower cost or fill out  paperwork to get insurance to cover a needed medication.    If a prior authorization is required to get your medication covered by your insurance company, please allow us  1-2 business days to complete this process.   Drug prices often vary depending on where the prescription is filled and some pharmacies may offer cheaper prices.   The website www.goodrx.com contains coupons for medications through different pharmacies. The prices here do not account for what the cost may be with help from insurance (it may be cheaper with your insurance), but the website can give you the price if you did not use any insurance.  - You can print the associated coupon and take it with your prescription to the pharmacy.  - You may also stop by our office during regular business hours and pick up a GoodRx coupon card.  - If you need your prescription sent electronically to a different pharmacy, notify our office through [facility] or by phone at [phone]

## 2023-08-10 ENCOUNTER — Ambulatory Visit: Payer: Commercial Managed Care - PPO | Admitting: Orthopedic Surgery

## 2023-08-10 ENCOUNTER — Other Ambulatory Visit (HOSPITAL_COMMUNITY): Payer: Self-pay

## 2023-08-10 ENCOUNTER — Ambulatory Visit
Admission: RE | Admit: 2023-08-10 | Discharge: 2023-08-10 | Disposition: A | Discharge status: Home / Self Care | Payer: Commercial Managed Care - PPO | Source: Ambulatory Visit | Attending: Physician Assistant | Admitting: Physician Assistant

## 2023-08-10 DIAGNOSIS — M1712 Unilateral primary osteoarthritis, left knee: Secondary | ICD-10-CM | POA: Diagnosis not present

## 2023-08-10 DIAGNOSIS — S83242D Other tear of medial meniscus, current injury, left knee, subsequent encounter: Secondary | ICD-10-CM

## 2023-08-10 DIAGNOSIS — S83512A Sprain of anterior cruciate ligament of left knee, initial encounter: Secondary | ICD-10-CM | POA: Diagnosis not present

## 2023-08-10 DIAGNOSIS — M25562 Pain in left knee: Secondary | ICD-10-CM | POA: Diagnosis not present

## 2023-08-10 DIAGNOSIS — G8929 Other chronic pain: Secondary | ICD-10-CM | POA: Diagnosis not present

## 2023-08-10 DIAGNOSIS — M75102 Unspecified rotator cuff tear or rupture of left shoulder, not specified as traumatic: Secondary | ICD-10-CM | POA: Diagnosis not present

## 2023-08-11 ENCOUNTER — Encounter: Payer: Self-pay | Admitting: Orthopedic Surgery

## 2023-08-11 NOTE — Telephone Encounter (Signed)
done

## 2023-08-12 ENCOUNTER — Encounter: Payer: Self-pay | Admitting: Orthopedic Surgery

## 2023-08-12 NOTE — Progress Notes (Signed)
Office Visit Note   Patient: Jasmine Ortega           Date of Birth: [birth year]           MRN: [id number] Visit Date: 08/10/2023 Requested by: Ronnald Nian, MD [address] [place],  [place] [address] PCP: Ronnald Nian, MD  Subjective: Chief Complaint  Patient presents with   Left Knee - Pain    HPI: Jasmine Ortega is a 43 y.o. female who presents to the office reporting left knee pain and instability.  She was doing some martial arts in early January and she felt pain and instability in the knee.  She states she was going from a squat to a stand and her knee gave way medially.  She does report instability and giving way along with medial pain.  Describes symptomatic instability in the with physical activities.  She has had an MRI scan which is reviewed.  This shows chronic ACL tear with vertical tear in the medial meniscus and no displaced fragment.  Minimal degenerative arthritic changes are present.  Patient has no personal or family history of DVT or pulmonary embolism.  She has 3 stairs at home along with the husband and 71-year-old.  Has taken ibuprofen with some relief.  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                ROS: All systems reviewed are negative as they relate to the chief complaint within the history of present illness.  Patient denies fevers or chills.  Assessment & Plan: Visit Diagnoses:  1. Rupture of anterior cruciate ligament of left knee, initial encounter   2. Other tear of medial meniscus, current injury, left knee, subsequent encounter     Plan: Impression is left knee ACL tear and vertical tear medial meniscus.  Patient is having symptomatic instability.  Patient would like to this the least amount of work as possible.  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  Plan is ACL reconstruction with likely medial meniscal repair based on the morphology of the  tear.  That would be an inside-out repair.  The risk and benefits of the procedure are discussed with the patient including but not limited to infection nerve vessel damage incomplete pain relief as well as incomplete restoration of stability and function.  Patient understands the extensive nature of the rehabilitative process involved.  I think her best graft option would be allograft bone patella tendon bone augmented with the MAC.  Patient understands the risk and benefits and wishes to proceed.  I think we could do this sometime in March so long as she avoids a lot of pivoting and cutting types of activities.  I think the morphology of that meniscal tear could lend itself to becoming unstable with cutting and pivoting activities prior to surgery.  Follow-Up Instructions: No follow-ups on file.   Orders:  No orders of the defined types were placed in this encounter.  No orders of the defined types were placed in this encounter.     Procedures: No procedures performed   Clinical Data: No additional findings.  Objective: Vital Signs: There were no vitals taken for this visit.  Physical Exam:  Constitutional: Patient appears well-developed HEENT:  Head: Normocephalic Eyes:EOM are normal Neck: Normal range of motion Cardiovascular: Normal rate Pulmonary/chest: Effort normal Neurologic: Patient is alert Skin: Skin is warm Psychiatric: Patient has normal  mood and affect  Ortho Exam: Ortho exam demonstrates mild effusion in the left knee.  Patient has full extension and full flexion with stable collateral ligaments.  PCL is intact.  No posterolateral or anterior medial rotational instability.  ACL laxity is present with anterior drawer and Lachman testing.  Extensor mechanism intact.  Pedal pulses palpable with good ankle dorsiflexion and no groin pain with internal or external rotation of the left leg.  Specialty Comments:  No specialty comments available.  Imaging: No results  found.   PMFS History: Patient Active Problem List   Diagnosis Date Noted   Pain in left knee 08/03/2023   Chronic low back pain without sciatica 09/23/2021   Dysthymia 09/23/2021   Obesity (BMI 30-39.9) 09/14/2019   Hyperlipidemia 09/14/2019   Gastroesophageal reflux disease 04/27/2017   Attention deficit hyperactivity disorder (ADHD), predominantly inattentive type 03/05/2015   Loss of transverse plantar arch of right foot 09/18/2014   Past Medical History:  Diagnosis Date   Birth control    Chicken pox    Depression    GERD (gastroesophageal reflux disease)    Gestational diabetes     Family History  Problem Relation Age of Onset   Breast cancer Mother    Cancer Mother 60       Breast cancer   Neuropathy Father    Arthritis Father    Hyperlipidemia Father    Hypertension Father    Stroke Father    Migraines Sister    Mitral valve prolapse Sister    Stroke Maternal Grandmother    Miscarriages / Stillbirths Maternal Grandfather    Arthritis Paternal Grandmother    Heart disease Paternal Grandmother    Diabetes Paternal Grandfather     No past surgical history on file. Social History   Occupational History   Not on file  Tobacco Use   Smoking status: Never   Smokeless tobacco: Never  Vaping Use   Vaping status: Never Used  Substance and Sexual Activity   Alcohol use: Yes    Alcohol/week: 2.0 standard drinks of alcohol    Types: 2 Glasses of wine per week   Drug use: No   Sexual activity: Yes

## 2023-08-17 ENCOUNTER — Other Ambulatory Visit (HOSPITAL_COMMUNITY)
Admission: RE | Admit: 2023-08-17 | Discharge: 2023-08-17 | Disposition: A | Discharge status: Home / Self Care | Payer: Self-pay | Source: Ambulatory Visit | Attending: Medical Genetics | Admitting: Medical Genetics

## 2023-08-23 ENCOUNTER — Encounter: Payer: Self-pay | Admitting: Internal Medicine

## 2023-08-28 LAB — GENECONNECT MOLECULAR SCREEN: Genetic Analysis Overall Interpretation: NEGATIVE

## 2023-08-31 DIAGNOSIS — F331 Major depressive disorder, recurrent, moderate: Secondary | ICD-10-CM | POA: Diagnosis not present

## 2023-09-06 NOTE — Pre-Procedure Instructions (Signed)
 Surgical Instructions   Your procedure is scheduled on Tuesday, March 18th. Report to [facility] Main Entrance "A" at 08:50 A.M., then check in with the Admitting office. Any questions or running late day of surgery: call [phone]  Questions prior to your surgery date: call [phone], Monday-Friday, 8am-4pm. If you experience any cold or flu symptoms such as cough, fever, chills, shortness of breath, etc. between now and your scheduled surgery, please notify us at the above number.     Remember:  Do not eat after midnight the night before your surgery   You may drink clear liquids until 07:50 AM the morning of your surgery.   Clear liquids allowed are: Water, Non-Citrus Juices (without pulp), Carbonated Beverages, Clear Tea (no milk, honey, etc.), Black Coffee Only (NO MILK, CREAM OR POWDERED CREAMER of any kind), and Gatorade.  Patient Instructions  The night before surgery:  No food after midnight. ONLY clear liquids after midnight  The day of surgery (if you do NOT have diabetes):  Drink ONE (1) Pre-Surgery Clear Ensure by 07:50 AM the morning of surgery. Drink in one sitting. Do not sip.  This drink was given to you during your hospital  pre-op appointment visit.  Nothing else to drink after completing the  Pre-Surgery Clear Ensure.          If you have questions, please contact your surgeon's office.    Take these medicines the morning of surgery with A SIP OF WATER  escitalopram (LEXAPRO)    May take these medicines IF NEEDED: omeprazole (PRILOSEC)    One week prior to surgery, STOP taking any Aspirin (unless otherwise instructed by your surgeon) Aleve, Naproxen, Ibuprofen, Motrin, Advil, Goody's, BC's, all herbal medications, fish oil, and non-prescription vitamins.                     Do NOT Smoke (Tobacco/Vaping) for 24 hours prior to your procedure.  If you use a CPAP at night, you may bring your mask/headgear for your overnight stay.   You will be  asked to remove any contacts, glasses, piercing's, hearing aid's, dentures/partials prior to surgery. Please bring cases for these items if needed.    Patients discharged the day of surgery will not be allowed to drive home, and someone needs to stay with them for 24 hours.  SURGICAL WAITING ROOM VISITATION Patients may have no more than 2 support people in the waiting area - these visitors may rotate.   Pre-op nurse will coordinate an appropriate time for 1 ADULT support person, who may not rotate, to accompany patient in pre-op.  Children under the age of 48 must have an adult with them who is not the patient and must remain in the main waiting area with an adult.  If the patient needs to stay at the hospital during part of their recovery, the visitor guidelines for inpatient rooms apply.  Please refer to the [facility] website for the visitor guidelines for any additional information.   If you received a COVID test during your pre-op visit  it is requested that you wear a mask when out in public, stay away from anyone that may not be feeling well and notify your surgeon if you develop symptoms. If you have been in contact with anyone that has tested positive in the last 10 days please notify you surgeon.      Pre-operative CHG Bathing Instructions   You can play a key role in reducing the risk of  infection after surgery. Your skin needs to be as free of germs as possible. You can reduce the number of germs on your skin by washing with CHG (chlorhexidine gluconate) soap before surgery. CHG is an antiseptic soap that kills germs and continues to kill germs even after washing.   DO NOT use if you have an allergy to chlorhexidine/CHG or antibacterial soaps. If your skin becomes reddened or irritated, stop using the CHG and notify one of our RNs at [phone].              TAKE A SHOWER THE NIGHT BEFORE SURGERY AND THE DAY OF SURGERY    Please keep in mind the following:  DO NOT shave,  including legs and underarms, 48 hours prior to surgery.   You may shave your face before/day of surgery.  Place clean sheets on your bed the night before surgery Use a clean washcloth (not used since being washed) for each shower. DO NOT sleep with pet's night before surgery.  CHG Shower Instructions:  Wash your face and private area with normal soap. If you choose to wash your hair, wash first with your normal shampoo.  After you use shampoo/soap, rinse your hair and body thoroughly to remove shampoo/soap residue.  Turn the water OFF and apply half the bottle of CHG soap to a CLEAN washcloth.  Apply CHG soap ONLY FROM YOUR NECK DOWN TO YOUR TOES (washing for 3-5 minutes)  DO NOT use CHG soap on face, private areas, open wounds, or sores.  Pay special attention to the area where your surgery is being performed.  If you are having back surgery, having someone wash your back for you may be helpful. Wait 2 minutes after CHG soap is applied, then you may rinse off the CHG soap.  Pat dry with a clean towel  Put on clean pajamas    Additional instructions for the day of surgery: DO NOT APPLY any lotions, deodorants, cologne, or perfumes.   Do not wear jewelry or makeup Do not wear nail polish, gel polish, artificial nails, or any other type of covering on natural nails (fingers and toes) Do not bring valuables to the hospital. [facility] is not responsible for valuables/personal belongings. Put on clean/comfortable clothes.  Please brush your teeth.  Ask your nurse before applying any prescription medications to the skin.

## 2023-09-07 ENCOUNTER — Encounter (HOSPITAL_COMMUNITY): Payer: Self-pay

## 2023-09-07 ENCOUNTER — Encounter (HOSPITAL_COMMUNITY)
Admission: RE | Admit: 2023-09-07 | Discharge: 2023-09-07 | Disposition: A | Discharge status: Home / Self Care | Source: Ambulatory Visit | Attending: Orthopedic Surgery | Admitting: Orthopedic Surgery

## 2023-09-07 ENCOUNTER — Other Ambulatory Visit: Payer: Self-pay

## 2023-09-07 DIAGNOSIS — Z01818 Encounter for other preprocedural examination: Secondary | ICD-10-CM

## 2023-09-07 DIAGNOSIS — Z01812 Encounter for preprocedural laboratory examination: Secondary | ICD-10-CM | POA: Insufficient documentation

## 2023-09-07 LAB — BASIC METABOLIC PANEL
Anion gap: 6 (ref 5–15)
BUN: 18 mg/dL (ref 6–20)
CO2: 26 mmol/L (ref 22–32)
Calcium: 8.7 mg/dL — ABNORMAL LOW (ref 8.9–10.3)
Chloride: 106 mmol/L (ref 98–111)
Creatinine, Ser: 0.94 mg/dL (ref 0.44–1.00)
GFR, Estimated: >60 mL/min (ref >60)
Glucose, Bld: 96 mg/dL (ref 70–99)
Potassium: 3.9 mmol/L (ref 3.5–5.1)
Sodium: 138 mmol/L (ref 135–145)

## 2023-09-07 LAB — CBC
HCT: 39.1 % (ref 36.0–46.0)
Hemoglobin: 13.1 g/dL (ref 12.0–15.0)
MCH: 28.6 pg (ref 26.0–34.0)
MCHC: 33.5 g/dL (ref 30.0–36.0)
MCV: 85.4 fL (ref 80.0–100.0)
Platelets: 255 10*3/uL (ref 150–400)
RBC: 4.58 MIL/uL (ref 3.87–5.11)
RDW: 13.9 % (ref 11.5–15.5)
WBC: 7.2 10*3/uL (ref 4.0–10.5)
nRBC: 0 % (ref 0.0–0.2)

## 2023-09-07 NOTE — Progress Notes (Signed)
 PCP - Dr. Sharlot Gowda Cardiologist -   PPM/ICD - denies Device Orders - na Rep Notified - na  Chest x-ray - na EKG - na Stress Test -  ECHO -  Cardiac Cath -   Sleep Study - denies CPAP - na  Non-diabetic  Blood Thinner Instructions: denies Aspirin Instructions:denies  ERAS Protcol - Ensure until 0750  Anesthesia review: No  Patient denies shortness of breath, fever, cough and chest pain at PAT appointment   All instructions explained to the patient, with a verbal understanding of the material. Patient agrees to go over the instructions while at home for a better understanding. Patient also instructed to self quarantine after being tested for COVID-19. The opportunity to ask questions was provided.

## 2023-09-13 ENCOUNTER — Other Ambulatory Visit: Payer: Self-pay

## 2023-09-13 ENCOUNTER — Other Ambulatory Visit (HOSPITAL_COMMUNITY): Payer: Self-pay

## 2023-09-13 ENCOUNTER — Encounter (HOSPITAL_COMMUNITY): Payer: Self-pay | Admitting: Orthopedic Surgery

## 2023-09-13 ENCOUNTER — Ambulatory Visit (HOSPITAL_COMMUNITY): Admitting: Anesthesiology

## 2023-09-13 ENCOUNTER — Encounter (HOSPITAL_COMMUNITY)
Admission: RE | Disposition: A | Discharge status: Home / Self Care | Payer: Self-pay | Source: Home / Self Care | Attending: Orthopedic Surgery

## 2023-09-13 ENCOUNTER — Ambulatory Visit (HOSPITAL_COMMUNITY)
Admission: RE | Admit: 2023-09-13 | Discharge: 2023-09-13 | Disposition: A | Discharge status: Home / Self Care | Payer: Commercial Managed Care - PPO | Attending: Orthopedic Surgery | Admitting: Orthopedic Surgery

## 2023-09-13 DIAGNOSIS — E119 Type 2 diabetes mellitus without complications: Secondary | ICD-10-CM | POA: Insufficient documentation

## 2023-09-13 DIAGNOSIS — S83242D Other tear of medial meniscus, current injury, left knee, subsequent encounter: Secondary | ICD-10-CM | POA: Diagnosis not present

## 2023-09-13 DIAGNOSIS — F32A Depression, unspecified: Secondary | ICD-10-CM | POA: Insufficient documentation

## 2023-09-13 DIAGNOSIS — K219 Gastro-esophageal reflux disease without esophagitis: Secondary | ICD-10-CM | POA: Insufficient documentation

## 2023-09-13 DIAGNOSIS — G8918 Other acute postprocedural pain: Secondary | ICD-10-CM | POA: Diagnosis not present

## 2023-09-13 DIAGNOSIS — S83512D Sprain of anterior cruciate ligament of left knee, subsequent encounter: Secondary | ICD-10-CM

## 2023-09-13 DIAGNOSIS — Y9375 Activity, martial arts: Secondary | ICD-10-CM | POA: Insufficient documentation

## 2023-09-13 DIAGNOSIS — E66813 Obesity, class 3: Secondary | ICD-10-CM | POA: Insufficient documentation

## 2023-09-13 DIAGNOSIS — Z6841 Body Mass Index (BMI) 40.0 and over, adult: Secondary | ICD-10-CM | POA: Diagnosis not present

## 2023-09-13 DIAGNOSIS — S83512A Sprain of anterior cruciate ligament of left knee, initial encounter: Secondary | ICD-10-CM | POA: Diagnosis not present

## 2023-09-13 DIAGNOSIS — Z01818 Encounter for other preprocedural examination: Secondary | ICD-10-CM

## 2023-09-13 DIAGNOSIS — S83242A Other tear of medial meniscus, current injury, left knee, initial encounter: Secondary | ICD-10-CM

## 2023-09-13 HISTORY — PX: ANTERIOR CRUCIATE LIGAMENT REPAIR: SHX115

## 2023-09-13 HISTORY — PX: KNEE ARTHROSCOPY WITH MENISCAL REPAIR: SHX5653

## 2023-09-13 HISTORY — PX: ALLOGRAFT APPLICATION: SHX6404

## 2023-09-13 HISTORY — PX: BONE MARROW ASPIRATION FOR SPINE FUSION ONLY (BMAC): SHX6866

## 2023-09-13 LAB — POCT PREGNANCY, URINE: Preg Test, Ur: NEGATIVE

## 2023-09-13 SURGERY — RECONSTRUCTION, KNEE, ACL
Anesthesia: Regional | Site: Knee | Laterality: Left

## 2023-09-13 MED ORDER — CEFAZOLIN SODIUM-DEXTROSE 2-4 GM/100ML-% IV SOLN
2.0000 g | INTRAVENOUS | Status: AC
  Administered 2023-09-13: 2 g via INTRAVENOUS
  Filled 2023-09-13: qty 100

## 2023-09-13 MED ORDER — PROPOFOL 10 MG/ML IV BOLUS
INTRAVENOUS | Status: DC | PRN
  Administered 2023-09-13: 200 mg via INTRAVENOUS

## 2023-09-13 MED ORDER — SODIUM CHLORIDE 0.9 % IR SOLN: Status: DC | PRN

## 2023-09-13 MED ORDER — OXYCODONE HCL 5 MG PO TABS
ORAL_TABLET | ORAL | Status: AC
  Filled 2023-09-13: qty 1

## 2023-09-13 MED ORDER — FENTANYL CITRATE (PF) 100 MCG/2ML IJ SOLN
INTRAMUSCULAR | Status: AC
  Filled 2023-09-13: qty 2

## 2023-09-13 MED ORDER — MORPHINE SULFATE (PF) 4 MG/ML IV SOLN
INTRAVENOUS | Status: DC | PRN
  Administered 2023-09-13: 8 mg

## 2023-09-13 MED ORDER — OXYCODONE HCL 5 MG/5ML PO SOLN: 5.0000 mg | Freq: Once | ORAL | Status: AC | PRN

## 2023-09-13 MED ORDER — BUPIVACAINE-EPINEPHRINE (PF) 0.5% -1:200000 IJ SOLN
INTRAMUSCULAR | Status: DC | PRN
  Administered 2023-09-13: 30 mL via PERINEURAL

## 2023-09-13 MED ORDER — CLONIDINE HCL (ANALGESIA) 100 MCG/ML EP SOLN
EPIDURAL | Status: DC | PRN
  Administered 2023-09-13: 1 mL

## 2023-09-13 MED ORDER — LIDOCAINE 2% (20 MG/ML) 5 ML SYRINGE
INTRAMUSCULAR | Status: DC | PRN
  Administered 2023-09-13: 40 mg via INTRAVENOUS

## 2023-09-13 MED ORDER — AMISULPRIDE (ANTIEMETIC) 5 MG/2ML IV SOLN
INTRAVENOUS | Status: AC
  Filled 2023-09-13: qty 4

## 2023-09-13 MED ORDER — 0.9 % SODIUM CHLORIDE (POUR BTL) OPTIME
TOPICAL | Status: DC | PRN
  Administered 2023-09-13: 1000 mL

## 2023-09-13 MED ORDER — MIDAZOLAM HCL 2 MG/2ML IJ SOLN: 2.0000 mg | Freq: Once | INTRAMUSCULAR | Status: AC

## 2023-09-13 MED ORDER — MORPHINE SULFATE (PF) 4 MG/ML IV SOLN
INTRAVENOUS | Status: AC
  Filled 2023-09-13: qty 2

## 2023-09-13 MED ORDER — FENTANYL CITRATE (PF) 250 MCG/5ML IJ SOLN
INTRAMUSCULAR | Status: AC
  Filled 2023-09-13: qty 5

## 2023-09-13 MED ORDER — VANCOMYCIN HCL 1000 MG IV SOLR
INTRAVENOUS | Status: AC
  Filled 2023-09-13: qty 20

## 2023-09-13 MED ORDER — OXYCODONE HCL 5 MG PO TABS
5.0000 mg | ORAL_TABLET | Freq: Once | ORAL | Status: AC | PRN
  Administered 2023-09-13: 5 mg via ORAL

## 2023-09-13 MED ORDER — MIDAZOLAM HCL 2 MG/2ML IJ SOLN
INTRAMUSCULAR | Status: DC | PRN
  Administered 2023-09-13: 2 mg via INTRAVENOUS

## 2023-09-13 MED ORDER — HEPARIN NA (PORK) LOCK FLSH PF 10 UNIT/ML IV SOLN
INTRAVENOUS | Status: DC | PRN
  Administered 2023-09-13: 30 mL

## 2023-09-13 MED ORDER — ORAL CARE MOUTH RINSE: 15.0000 mL | Freq: Once | OROMUCOSAL | Status: AC

## 2023-09-13 MED ORDER — ONDANSETRON HCL 4 MG/2ML IJ SOLN
INTRAMUSCULAR | Status: DC | PRN
  Administered 2023-09-13: 4 mg via INTRAVENOUS

## 2023-09-13 MED ORDER — AMISULPRIDE (ANTIEMETIC) 5 MG/2ML IV SOLN
10.0000 mg | Freq: Once | INTRAVENOUS | Status: AC | PRN
  Administered 2023-09-13: 10 mg via INTRAVENOUS

## 2023-09-13 MED ORDER — FENTANYL CITRATE (PF) 250 MCG/5ML IJ SOLN
INTRAMUSCULAR | Status: DC | PRN
  Administered 2023-09-13: 100 ug via INTRAVENOUS
  Administered 2023-09-13: 50 ug via INTRAVENOUS

## 2023-09-13 MED ORDER — PROPOFOL 10 MG/ML IV BOLUS
INTRAVENOUS | Status: AC
  Filled 2023-09-13: qty 20

## 2023-09-13 MED ORDER — POVIDONE-IODINE 7.5 % EX SOLN
Freq: Once | CUTANEOUS | Status: DC
  Filled 2023-09-13: qty 118

## 2023-09-13 MED ORDER — MIDAZOLAM HCL 2 MG/2ML IJ SOLN
INTRAMUSCULAR | Status: AC
  Filled 2023-09-13: qty 2

## 2023-09-13 MED ORDER — METHOCARBAMOL 500 MG PO TABS
500.0000 mg | ORAL_TABLET | Freq: Three times a day (TID) | ORAL | 1 refills | Status: AC | PRN
Start: 2023-09-13 — End: ?
  Filled 2023-09-13 (×2): qty 30, 10d supply, fill #0

## 2023-09-13 MED ORDER — BUPIVACAINE-EPINEPHRINE (PF) 0.25% -1:200000 IJ SOLN
INTRAMUSCULAR | Status: AC
  Filled 2023-09-13: qty 30

## 2023-09-13 MED ORDER — ACETAMINOPHEN 10 MG/ML IV SOLN: 1000.0000 mg | Freq: Once | INTRAVENOUS | Status: DC | PRN

## 2023-09-13 MED ORDER — POVIDONE-IODINE 10 % EX SWAB: 2.0000 | Freq: Once | CUTANEOUS | Status: DC

## 2023-09-13 MED ORDER — PHENYLEPHRINE 80 MCG/ML (10ML) SYRINGE FOR IV PUSH (FOR BLOOD PRESSURE SUPPORT)
PREFILLED_SYRINGE | INTRAVENOUS | Status: DC | PRN
  Administered 2023-09-13 (×3): 80 ug via INTRAVENOUS

## 2023-09-13 MED ORDER — VANCOMYCIN HCL 1 G IV SOLR
INTRAVENOUS | Status: DC | PRN
  Administered 2023-09-13: 1000 mg via TOPICAL

## 2023-09-13 MED ORDER — OXYCODONE HCL 5 MG PO TABS
5.0000 mg | ORAL_TABLET | ORAL | 0 refills | Status: DC | PRN
  Filled 2023-09-13 (×2): qty 30, 5d supply, fill #0

## 2023-09-13 MED ORDER — FENTANYL CITRATE (PF) 100 MCG/2ML IJ SOLN: 25.0000 ug | INTRAMUSCULAR | Status: DC | PRN

## 2023-09-13 MED ORDER — LACTATED RINGERS IV SOLN: INTRAVENOUS | Status: DC

## 2023-09-13 MED ORDER — HEPARIN SODIUM (PORCINE) 1000 UNIT/ML IJ SOLN
INTRAMUSCULAR | Status: AC
  Filled 2023-09-13: qty 1

## 2023-09-13 MED ORDER — DEXAMETHASONE SODIUM PHOSPHATE 10 MG/ML IJ SOLN
INTRAMUSCULAR | Status: DC | PRN
  Administered 2023-09-13: 10 mg via INTRAVENOUS

## 2023-09-13 MED ORDER — POVIDONE-IODINE 10 % EX SWAB
2.0000 | Freq: Once | CUTANEOUS | Status: AC
  Administered 2023-09-13: 2 via TOPICAL

## 2023-09-13 MED ORDER — BUPIVACAINE-EPINEPHRINE (PF) 0.25% -1:200000 IJ SOLN
INTRAMUSCULAR | Status: DC | PRN
  Administered 2023-09-13: 30 mL

## 2023-09-13 MED ORDER — TRANEXAMIC ACID-NACL 1000-0.7 MG/100ML-% IV SOLN
1000.0000 mg | INTRAVENOUS | Status: AC
  Administered 2023-09-13: 1000 mg via INTRAVENOUS
  Filled 2023-09-13: qty 100

## 2023-09-13 MED ORDER — BUPIVACAINE-EPINEPHRINE (PF) 0.5% -1:200000 IJ SOLN
INTRAMUSCULAR | Status: AC
  Filled 2023-09-13: qty 30

## 2023-09-13 MED ORDER — CLONIDINE HCL (ANALGESIA) 100 MCG/ML EP SOLN
EPIDURAL | Status: AC
  Filled 2023-09-13: qty 10

## 2023-09-13 MED ORDER — EPINEPHRINE PF 1 MG/ML IJ SOLN
INTRAMUSCULAR | Status: AC
  Filled 2023-09-13: qty 4

## 2023-09-13 MED ORDER — ACETAMINOPHEN 500 MG PO TABS
1000.0000 mg | ORAL_TABLET | Freq: Once | ORAL | Status: AC
  Administered 2023-09-13: 1000 mg via ORAL
  Filled 2023-09-13: qty 2

## 2023-09-13 MED ORDER — BUPIVACAINE HCL (PF) 0.25 % IJ SOLN
INTRAMUSCULAR | Status: AC
  Filled 2023-09-13: qty 10

## 2023-09-13 MED ORDER — MIDAZOLAM HCL 2 MG/2ML IJ SOLN
INTRAMUSCULAR | Status: AC
  Administered 2023-09-13: 2 mg via INTRAVENOUS
  Filled 2023-09-13: qty 2

## 2023-09-13 MED ORDER — CHLORHEXIDINE GLUCONATE 0.12 % MT SOLN
15.0000 mL | Freq: Once | OROMUCOSAL | Status: AC
  Administered 2023-09-13: 15 mL via OROMUCOSAL
  Filled 2023-09-13: qty 15

## 2023-09-13 MED ORDER — ASPIRIN 81 MG PO CHEW
81.0000 mg | CHEWABLE_TABLET | Freq: Two times a day (BID) | ORAL | 0 refills | Status: AC
  Filled 2023-09-13 (×2): qty 60, 30d supply, fill #0

## 2023-09-13 SURGICAL SUPPLY — 104 items
ALCOHOL 70% 16 OZ (MISCELLANEOUS) ×2 IMPLANT
ANCHOR BUTTON TIGHTROPE ACL RT (Orthopedic Implant) IMPLANT
BAG COUNTER SPONGE SURGICOUNT (BAG) ×2 IMPLANT
BANDAGE ESMARK 6X9 LF (GAUZE/BANDAGES/DRESSINGS) ×2 IMPLANT
BLADE CLIPPER SURG (BLADE) IMPLANT
BLADE EXCALIBUR 4.0X13 (MISCELLANEOUS) ×2 IMPLANT
BLADE SURG 10 STRL SS (BLADE) ×2 IMPLANT
BLADE SURG 15 STRL LF DISP TIS (BLADE) ×4 IMPLANT
BNDG ELASTIC 6INX 5YD STR LF (GAUZE/BANDAGES/DRESSINGS) IMPLANT
BNDG ELASTIC 6X15 VLCR STRL LF (GAUZE/BANDAGES/DRESSINGS) ×2 IMPLANT
BNDG ESMARK 6X9 LF (GAUZE/BANDAGES/DRESSINGS) IMPLANT
BURR OVAL 8 FLU 4.0X13 (MISCELLANEOUS) ×2 IMPLANT
COVER MAYO STAND STRL (DRAPES) ×2 IMPLANT
COVER SURGICAL LIGHT HANDLE (MISCELLANEOUS) ×2 IMPLANT
CUFF TOURN SGL QUICK 42 (TOURNIQUET CUFF) IMPLANT
CUFF TRNQT CYL 34X4.125X (TOURNIQUET CUFF) IMPLANT
CUTTER BONE 4.0MM X 13CM (MISCELLANEOUS) ×2 IMPLANT
DRAPE ARTHROSCOPY W/POUCH 114 (DRAPES) ×2 IMPLANT
DRAPE HALF SHEET 40X57 (DRAPES) IMPLANT
DRAPE INCISE IOBAN 66X45 STRL (DRAPES) ×2 IMPLANT
DRAPE OEC MINIVIEW 54X84 (DRAPES) IMPLANT
DRAPE SURG ORHT 6 SPLT 77X108 (DRAPES) ×2 IMPLANT
DRAPE U-SHAPE 47X51 STRL (DRAPES) ×2 IMPLANT
DRSG IV TEGADERM 3.5X4.5 STRL (GAUZE/BANDAGES/DRESSINGS) IMPLANT
DRSG TEGADERM 4X4.75 (GAUZE/BANDAGES/DRESSINGS) ×6 IMPLANT
DURAPREP 26ML APPLICATOR (WOUND CARE) ×2 IMPLANT
DW OUTFLOW CASSETTE/TUBE SET (MISCELLANEOUS) ×2 IMPLANT
ELECT REM PT RETURN 9FT ADLT (ELECTROSURGICAL) ×2 IMPLANT
ELECTRODE REM PT RTRN 9FT ADLT (ELECTROSURGICAL) ×2 IMPLANT
GAUZE PAD ABD 8X10 STRL (GAUZE/BANDAGES/DRESSINGS) ×6 IMPLANT
GAUZE SPONGE 4X4 12PLY STRL (GAUZE/BANDAGES/DRESSINGS) ×4 IMPLANT
GAUZE XEROFORM 1X8 LF (GAUZE/BANDAGES/DRESSINGS) ×2 IMPLANT
GLOVE BIO SURGEON STRL SZ7 (GLOVE) ×4 IMPLANT
GLOVE BIOGEL PI IND STRL 7.0 (GLOVE) ×2 IMPLANT
GLOVE BIOGEL PI IND STRL 8 (GLOVE) ×2 IMPLANT
GLOVE ECLIPSE 7.0 STRL STRAW (GLOVE) ×2 IMPLANT
GLOVE ECLIPSE 8.0 STRL XLNG CF (GLOVE) ×2 IMPLANT
GLOVE INDICATOR 7.0 STRL GRN (GLOVE) ×2 IMPLANT
GOWN STRL REUS W/ TWL LRG LVL3 (GOWN DISPOSABLE) ×6 IMPLANT
GOWN STRL REUS W/ TWL XL LVL3 (GOWN DISPOSABLE) ×2 IMPLANT
IMP SYS 2ND FIX PEEK 4.75X19.1 (Miscellaneous) ×2 IMPLANT
IMPL SYS 2ND FX PEEK 4.75X19.1 (Miscellaneous) IMPLANT
KIT BASIN OR (CUSTOM PROCEDURE TRAY) ×2 IMPLANT
KIT BIOCARTILAGE LG JOINT MIX (KITS) ×2 IMPLANT
KIT BONE MRW ASP ANGEL CPRP (KITS) IMPLANT
KIT TRANSTIBIAL (DISPOSABLE) IMPLANT
KIT TURNOVER KIT B (KITS) ×2 IMPLANT
MANIFOLD NEPTUNE II (INSTRUMENTS) ×2 IMPLANT
MARKER SKIN DUAL TIP RULER LAB (MISCELLANEOUS) ×2 IMPLANT
NDL 18GX1X1/2 (RX/OR ONLY) (NEEDLE) ×2 IMPLANT
NDL HYPO 25GX1X1/2 BEV (NEEDLE) ×2 IMPLANT
NDL SUT 2-0 SCORPION KNEE (NEEDLE) IMPLANT
NEEDLE 18GX1X1/2 (RX/OR ONLY) (NEEDLE) ×2 IMPLANT
NEEDLE HYPO 25GX1X1/2 BEV (NEEDLE) ×4 IMPLANT
NEEDLE SUT 2-0 SCORPION KNEE (NEEDLE) IMPLANT
NS IRRIG 1000ML POUR BTL (IV SOLUTION) ×2 IMPLANT
PACK ARTHROSCOPY DSU (CUSTOM PROCEDURE TRAY) ×2 IMPLANT
PAD ARMBOARD POSITIONER FOAM (MISCELLANEOUS) ×4 IMPLANT
PAD CAST 4YDX4 CTTN HI CHSV (CAST SUPPLIES) ×2 IMPLANT
PAD COLD SHLDR WRAP-ON (PAD) ×2 IMPLANT
PADDING CAST COTTON 6X4 STRL (CAST SUPPLIES) ×6 IMPLANT
PENCIL BUTTON HOLSTER BLD 10FT (ELECTRODE) ×2 IMPLANT
PK GRAFTLINK AUTO IMPLANT SYST (Anchor) IMPLANT
PORT APPOLLO RF 90DEGREE MULTI (SURGICAL WAND) IMPLANT
SCREW BIOCOMPOSITE 8X20 INTER (Screw) IMPLANT
SOL PREP POV-IOD 4OZ 10% (MISCELLANEOUS) ×2 IMPLANT
SPIKE FLUID TRANSFER (MISCELLANEOUS) ×2 IMPLANT
SPONGE T-LAP 4X18 ~~LOC~~+RFID (SPONGE) ×4 IMPLANT
STRIP CLOSURE SKIN 1/2X4 (GAUZE/BANDAGES/DRESSINGS) IMPLANT
SUCTION TUBE FRAZIER 10FR DISP (SUCTIONS) ×2 IMPLANT
SUT 2 FIBERLOOP 20 STRT BLUE (SUTURE) IMPLANT
SUT ETHILON 3 0 PS 1 (SUTURE) ×8 IMPLANT
SUT FIBERWIRE 2-0 18 17.9 3/8 (SUTURE) IMPLANT
SUT MENISCAL KIT (KITS) IMPLANT
SUT MNCRL AB 3-0 PS2 18 (SUTURE) ×4 IMPLANT
SUT MNCRL AB 4-0 PS2 18 (SUTURE) ×4 IMPLANT
SUT VIC AB 0 CT1 27XBRD ANBCTR (SUTURE) ×4 IMPLANT
SUT VIC AB 1 CT1 27XBRD ANTBC (SUTURE) ×6 IMPLANT
SUT VIC AB 2-0 CT2 27 (SUTURE) ×4 IMPLANT
SUT VIC AB 3-0 CT1 TAPERPNT 27 (SUTURE) ×4 IMPLANT
SUT VIC AB 3-0 PS2 18XBRD (SUTURE) IMPLANT
SUT VICRYL 0 AB UR-6 (SUTURE) ×6 IMPLANT
SUT VICRYL 0 UR6 27IN ABS (SUTURE) ×2 IMPLANT
SUTURE 2 FIBERLOOP 20 STRT BLU (SUTURE) IMPLANT
SUTURE FIBERWR 2-0 18 17.9 3/8 (SUTURE) IMPLANT
SUTURE TAPE 1.3 40 TPR END (SUTURE) IMPLANT
SUTURE TAPE TIGERLINK 1.3MM BL (SUTURE) ×2 IMPLANT
SUTURETAPE 1.3 40 TPR END (SUTURE) ×8 IMPLANT
SUTURETAPE TIGERLINK 1.3MM BL (SUTURE) IMPLANT
SYR 20ML ECCENTRIC (SYRINGE) ×2 IMPLANT
SYR 30ML LL (SYRINGE) ×2 IMPLANT
SYR BULB IRRIG 60ML STRL (SYRINGE) ×2 IMPLANT
SYR CONTROL 10ML LL (SYRINGE) IMPLANT
SYR TB 1ML LUER SLIP (SYRINGE) ×2 IMPLANT
TAPE SUT LABRALTAP WHT/BLK (SUTURE) ×2 IMPLANT
TISSUE TENDON PILOTGRAFT BTB (Tissue) IMPLANT
TOWEL GREEN STERILE (TOWEL DISPOSABLE) ×4 IMPLANT
TOWEL GREEN STERILE FF (TOWEL DISPOSABLE) ×2 IMPLANT
TUBE CONNECTING 12X1/4 (SUCTIONS) ×2 IMPLANT
TUBING ARTHROSCOPY IRRIG 16FT (MISCELLANEOUS) ×2 IMPLANT
UNDERPAD 30X36 HEAVY ABSORB (UNDERPADS AND DIAPERS) ×2 IMPLANT
WAND ABLATOR APOLLO I90 (BUR) IMPLANT
WATER STERILE IRR 1000ML POUR (IV SOLUTION) ×2 IMPLANT
YANKAUER SUCT BULB TIP NO VENT (SUCTIONS) ×2 IMPLANT

## 2023-09-13 NOTE — Transfer of Care (Signed)
 Immediate Anesthesia Transfer of Care Note  Patient: Jasmine Ortega  Procedure(s) Performed: LEFT KNEE ACL TEAR RECONSTRUCTION (Left: Knee) MEDIAL MENISCAL TEAR REPAIR (Left: Knee) BONE -PATELLA-TENDON-BONE ALLOGRAFT (Left: Knee) BONE MARROW ASPIRATE CONCENTRATE FROM ILLIAC CREST (Left: Hip)  Patient Location: PACU  Anesthesia Type:General  Level of Consciousness: drowsy  Airway & Oxygen Therapy: Patient connected to face mask oxygen  Post-op Assessment: Post -op Vital signs reviewed and stable  Post vital signs: stable  Last Vitals:  Vitals Value Taken Time  BP 102/54 09/13/23 1425  Temp 36.2 C 09/13/23 1425  Pulse 74 09/13/23 1427  Resp 18 09/13/23 1427  SpO2 95 % 09/13/23 1427  Vitals shown include unfiled device data.  Last Pain:  Vitals:   09/13/23 1110  TempSrc:   PainSc: 0-No pain         Complications: No notable events documented.

## 2023-09-13 NOTE — H&P (Signed)
 Jasmine Ortega is an 43 y.o. female.   Chief Complaint: left knee pain and instability HPI: Jasmine Ortega is a 43 y.o. female who presents to the office reporting left knee pain and instability.  She was doing some martial arts in early January and she felt pain and instability in the knee.  She states she was going from a squat to a stand and her knee gave way medially.  She does report instability and giving way along with medial pain.  Describes symptomatic instability in the with physical activities.   She has had an MRI scan which is reviewed.  This shows chronic ACL tear with vertical tear in the medial meniscus and no displaced fragment.  Minimal degenerative arthritic changes are present.   Patient has no personal or family history of DVT or pulmonary embolism.  She has 3 stairs at home along with the husband and 53-year-old.  Has taken ibuprofen with some relief.  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[employer].Marland Kitchen  Past Medical History:  Diagnosis Date   Birth control    Chicken pox    Depression    GERD (gastroesophageal reflux disease)    Gestational diabetes     Past Surgical History:  Procedure Laterality Date   LASIK Bilateral    NO PAST SURGERIES      Family History  Problem Relation Age of Onset   Breast cancer Mother    Cancer Mother 61       Breast cancer   Neuropathy Father    Arthritis Father    Hyperlipidemia Father    Hypertension Father    Stroke Father    Migraines Sister    Mitral valve prolapse Sister    Stroke Maternal Grandmother    Miscarriages / Stillbirths Maternal Grandfather    Arthritis Paternal Grandmother    Heart disease Paternal Grandmother    Diabetes Paternal Grandfather    Social History:  reports that she has never smoked. She has never used smokeless tobacco. She reports current alcohol use of about 2.0 standard drinks of alcohol per week. She reports that she does not use drugs.  Allergies:  Allergies   Allergen Reactions   Meloxicam Anaphylaxis and Hives    Medications Prior to Admission  Medication Sig Dispense Refill   amphetamine-dextroamphetamine (ADDERALL XR) 15 MG 24 hr capsule Take 1 capsule by mouth every morning. 90 capsule 0   amphetamine-dextroamphetamine (ADDERALL XR) 5 MG 24 hr capsule Take 1 capsule (5 mg total) by mouth daily. 90 capsule 0   Cholecalciferol (VITAMIN D) 125 MCG (5000 UT) CAPS Take 5,000 Units by mouth daily.     escitalopram (LEXAPRO) 10 MG tablet Take 1 tablet (10 mg total) by mouth daily. 90 tablet 1   levonorgestrel (MIRENA, 52 MG,) 20 MCG/24HR IUD 1 each by Intrauterine route once.     Omega 3 1000 MG CAPS Take 1,000 mg by mouth daily.     omeprazole (PRILOSEC) 20 MG capsule Take 20 mg by mouth daily as needed (acid reflux).     cyclobenzaprine (FLEXERIL) 10 MG tablet Take 1 tablet (10 mg total) by mouth 3 (three) times daily as needed for muscle spasms. (Patient not taking: Reported on 09/01/2023) 12 tablet 0   Dapsone 5 % topical gel Apply to acne nightly (Patient taking differently: Apply 1 Application topically at bedtime as needed (acne).) 30 g 2   hydrocortisone 2.5 % cream Apply topically 2 (two) times daily as  needed (Rash). 30 g 11    Results for orders placed or performed during the hospital encounter of 09/13/23 (from the past 48 hours)  Pregnancy, urine POC     Status: None   Collection Time: 09/13/23 10:07 AM  Result Value Ref Range   Preg Test, Ur NEGATIVE NEGATIVE    Comment:        THE SENSITIVITY OF THIS METHODOLOGY IS >24 mIU/mL    No results found.  Review of Systems  Musculoskeletal:  Positive for arthralgias.  All other systems reviewed and are negative.   Blood pressure 122/85, pulse 88, temperature 98.5 F (36.9 C), temperature source Oral, resp. rate 18, height 5\' 4"  (1.626 m), weight 108 kg, last menstrual period 09/03/2023, SpO2 95%. Physical Exam Vitals reviewed.  HENT:     Head: Normocephalic.     Nose: Nose  normal.     Mouth/Throat:     Mouth: Mucous membranes are moist.  Eyes:     Pupils: Pupils are equal, round, and reactive to light.  Cardiovascular:     Rate and Rhythm: Normal rate.     Pulses: Normal pulses.  Pulmonary:     Effort: Pulmonary effort is normal.  Abdominal:     General: Abdomen is flat.  Musculoskeletal:     Cervical back: Normal range of motion.  Skin:    General: Skin is warm.     Capillary Refill: Capillary refill takes less than 2 seconds.  Neurological:     General: No focal deficit present.     Mental Status: She is alert.  Psychiatric:        Mood and Affect: Mood normal.    Ortho exam demonstrates mild effusion in the left knee. Patient has full extension and full flexion with stable collateral ligaments. PCL is intact. No posterolateral or anterior medial rotational instability. ACL laxity is present with anterior drawer and Lachman testing. Extensor mechanism intact. Pedal pulses palpable with good ankle dorsiflexion and no groin pain with internal or external rotation of the left leg. Positive MJLT.  Assessment/Plan mpression is left knee ACL tear and vertical tear medial meniscus. Patient is having symptomatic instability. Patient would like to miss the least amount of work as possible. 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 Plan is ACL reconstruction with likely medial meniscal repair based on the morphology of the tear. That would be an inside-out repair. The risk and benefits of the procedure are discussed with the patient including but not limited to infection nerve vessel damage incomplete pain relief as well as incomplete restoration of stability and function. Patient understands the extensive nature of the rehabilitative process involved. I think her best graft option would be allograft bone patella tendon bone augmented with BMAC. Patient understands the risk and benefits and wishes to proceed. I think we  could do this sometime in March so long as she avoids a lot of pivoting and cutting types of activities. I think the morphology of that meniscal tear could lend itself to becoming unstable with cutting and pivoting activities prior to surgery.   Burnard Bunting, MD 09/13/2023, 10:45 AM

## 2023-09-13 NOTE — Anesthesia Procedure Notes (Signed)
 Procedure Name: LMA Insertion Date/Time: 09/13/2023 11:58 AM  Performed by: Hessie Diener, CRNAPre-anesthesia Checklist: Patient identified, Emergency Drugs available, Suction available and Patient being monitored Patient Re-evaluated:Patient Re-evaluated prior to induction Oxygen Delivery Method: Circle System Utilized Preoxygenation: Pre-oxygenation with 100% oxygen Induction Type: IV induction Ventilation: Mask ventilation without difficulty LMA: LMA inserted LMA Size: 4.0 Number of attempts: 1 Airway Equipment and Method: Bite block Placement Confirmation: positive ETCO2 Tube secured with: Tape Dental Injury: Teeth and Oropharynx as per pre-operative assessment

## 2023-09-13 NOTE — Op Note (Signed)
   09/13/2023  2:27 PM  PATIENT:  Estelle June  43 y.o. female  PRE-OPERATIVE DIAGNOSIS:  LEFT KNEE ACL TEAR, MEDIAL MENISCAL TEAR  POST-OPERATIVE DIAGNOSIS:  LEFT KNEE ACL TEAR, MEDIAL MENISCAL TEAR  PROCEDURE:  Procedure(s): LEFT KNEE ACL TEAR RECONSTRUCTION MEDIAL MENISCAL TEAR debridement BONE -PATELLA-TENDON-BONE ALLOGRAFT BONE MARROW ASPIRATE CONCENTRATE FROM ILLIAC CREST  SURGEON:  Surgeon(s): Cammy Copa, MD  ASSISTANT: magnant pa  ANESTHESIA:   general  EBL: 15 ml    Total I/O In: 800 [I.V.:800] Out: 20 [Blood:20]  BLOOD ADMINISTERED: none  DRAINS: none   LOCAL MEDICATIONS USED:  marcaine morhine clonidine vanco  SPECIMEN:  No Specimen  COUNTS:  YES  TOURNIQUET:  * Missing tourniquet times found for documented tourniquets in log: [id number] *  DICTATION: .Other Dictation: Dictation Number [id number]  PLAN OF CARE: Discharge to home after PACU  PATIENT DISPOSITION:  PACU - hemodynamically stable

## 2023-09-13 NOTE — Anesthesia Postprocedure Evaluation (Signed)
 Anesthesia Post Note  Patient: Jasmine Ortega  Procedure(s) Performed: LEFT KNEE ACL TEAR RECONSTRUCTION (Left: Knee) MEDIAL MENISCAL TEAR REPAIR (Left: Knee) BONE -PATELLA-TENDON-BONE ALLOGRAFT (Left: Knee) BONE MARROW ASPIRATE CONCENTRATE FROM ILLIAC CREST (Left: Hip)     Patient location during evaluation: PACU Anesthesia Type: Regional and General Level of consciousness: awake Pain management: pain level controlled Vital Signs Assessment: post-procedure vital signs reviewed and stable Respiratory status: spontaneous breathing, nonlabored ventilation and respiratory function stable Cardiovascular status: blood pressure returned to baseline and stable Postop Assessment: no apparent nausea or vomiting Anesthetic complications: no   No notable events documented.  Last Vitals:  Vitals:   09/13/23 1530 09/13/23 1545  BP: 117/77 113/78  Pulse: 99 93  Resp: 16 18  Temp:  (!) 36.4 C  SpO2: 96% 98%    Last Pain:  Vitals:   09/13/23 1425  TempSrc:   PainSc: Asleep                 Kairos Panetta P Kentrell Guettler

## 2023-09-13 NOTE — Op Note (Signed)
 NAMEHANEEFAH, VENTURINI MEDICAL RECORD NO: [id number] ACCOUNT NO: [id number] DATE OF BIRTH: 1981/04/04 FACILITY: [facility] LOCATION: [facility] PHYSICIAN: Graylin Shiver. August Saucer, MD  Operative Report   DATE OF PROCEDURE: 09/13/2023  PREOPERATIVE DIAGNOSIS:  Left knee ACL tear and medial meniscal tear.  POSTOPERATIVE DIAGNOSIS:  Left knee ACL tear and medial meniscal tear.  PROCEDURE:  Left knee ACL reconstruction using bone-patellar tendon-bone allograft infused with BMAC with partial medial meniscectomy.  SURGEON:  Graylin Shiver. August Saucer, MD  ASSISTANT:  Karenann Cai, PA  INDICATIONS:  This is a 43 year old patient with left knee pain and instability.  He presents for operative management after explanation of risks and benefits.  DESCRIPTION OF PROCEDURE:  The patient was brought to the operating room where general anesthetic was induced.  Preoperative antibiotics administered.  Timeout was called.  The left leg was examined under anesthesia and found to have full range of motion  with stable collateral ligaments to varus and valgus stress at 0 and 30 degrees.  PCL intact.  No posterolateral rotatory instability was present.  Anterior drawer and Lachman were positive for ACL laxity.  Left leg was prescrubbed with alcohol and  Betadine allowed to air dry, prepped with DuraPrep solution and draped in a sterile manner.  We also prepped out the left hip in a similar manner.  This was in the iliac crest region.  Timeout was called.  Small incision was made 3 fingerbreadths  proximal to the anterior superior iliac crest.  Jamshidi needle was placed into that iliac crest region between the two tables and bone marrow aspirate was obtained.  This was prepared to platelet-rich plasma and BMAC.  This incision was irrigated,  anesthetized and closed using two 3-0 nylon sutures.  Next, the knee anterior, inferolateral, and anterior inferomedial portals were anesthetized using 5 mL of Marcaine with epinephrine.  Portals were  created.  Diagnostic arthroscopy was performed.   Concurrent with this, Karenann Cai, PA was preparing the graft on the back table by infusing BMAC into the graft itself, soaking the bone plugs in the PRP as well as affixing an internal brace to the graft.  Concurrent with this, arthroscopy was  performed.  Lateral compartment articular cartilage and meniscus was intact.  The ACL was torn.  PCL was intact.  There was fairly significant chondromalacia over about 50% to 60% of the weightbearing surface area of the medial femoral condyle, which was  grade II.  These loose flaps were debrided using a smooth shaver.  Meniscus was also affected.  The meniscal tear was not repairable.  Had a degenerative horizontal cleavage tear with a more acute radial tear, but there was about 40% to 50% of that  meniscal volume remaining at its posterior attachment.  At this time, the meniscal tear was debrided using a basket punch and shaver.  Notchplasty performed.  Tunnel was then drilled at 50 degrees on the tibial tunnel with a 9 mm Acorn reamer in the  posterior aspect of the native ACL footprint.  2 mm over-the-top guide was placed and we were able to place the femoral tunnel in the 2 o'clock position.  Back wall confirmed and intact at about 1 to 1.5 mm.  The bone plugs fit nicely into the tunnels.   The interference screw was placed on the femoral side, which was 8 x 20 mm.  Excellent fixation obtained.  Next, the knee was cycled and we placed an interference screw on the tibial side with the knee in extension.  We did tap for both screws.  Overall,  very secure construct was achieved with about 1.5 mm anterior drawer at 90 degrees and 1 mm anterior drawer and 1 mm Lachman solid endpoint at 15 degrees.  No pivot shift present.  At this time, thorough irrigation was performed on all incisions.  We  did have to make an accessory incision for the tibial tunnel.  This incision was also irrigated and the knee joint itself  was thoroughly irrigated.  The portals were closed using 3-0 Vicryl and 3-0 nylon.  The incision for the tibial tunnel was closed  using 0 Vicryl suture, 2-0 Vicryl suture, and 3-0 Monocryl with Steri-Strips and impervious dressing applied.  We did inject Marcaine, morphine, and clonidine into the knee joint for postop pain relief followed by the remaining portion of the PRP.  Ace  wrap and knee immobilizer placed.  Luke's assistance was required at all times for graft preparation, opening and closing.  His assistance was medical necessity.   PUS D: 09/13/2023 2:33:36 pm T: 09/13/2023 2:50:00 pm  JOB: [id number]/ [id number]

## 2023-09-13 NOTE — Progress Notes (Signed)
 Orthopedic Tech Progress Note Patient Details:  Jasmine Ortega May 17, 1981 [id number]  Ortho Devices Type of Ortho Device: Crutches Ortho Device/Splint Interventions: Ordered, Application, Adjustment  PACU RN called requesting a pair of crutches, taught patient how to used them and she did great    Post Interventions Patient Tolerated: Well, Ambulated well Instructions Provided: Poper ambulation with device, Care of device  Donald Pore 09/13/2023, 4:24 PM

## 2023-09-13 NOTE — Anesthesia Procedure Notes (Signed)
 Anesthesia Regional Block: Adductor canal block   Pre-Anesthetic Checklist: , timeout performed,  Correct Patient, Correct Site, Correct Laterality,  Correct Procedure,, site marked,  Risks and benefits discussed,  Surgical consent,  Pre-op evaluation,  At surgeon's request and post-op pain management  Laterality: Left  Prep: chloraprep       Needles:  Injection technique: Single-shot  Needle Type: Echogenic Stimulator Needle     Needle Length: 10cm  Needle Gauge: 20     Additional Needles:   Procedures:,,,, ultrasound used (permanent image in chart),,    Narrative:  Start time: 09/13/2023 11:00 AM End time: 09/13/2023 11:10 AM Injection made incrementally with aspirations every 5 mL.  Performed by: Personally  Anesthesiologist: Leonides Grills, MD  Additional Notes: Functioning IV was confirmed and monitors were applied. A time-out was performed. Hand hygiene and sterile gloves were used. The thigh was placed in a frog-leg position and prepped in a sterile fashion. A 20ga Bbraun echogenic stimulator needle was placed using ultrasound guidance.  Negative aspiration and negative test dose prior to incremental administration of local anesthetic. The patient tolerated the procedure well.

## 2023-09-13 NOTE — Anesthesia Preprocedure Evaluation (Addendum)
 Anesthesia Evaluation  Patient identified by MRN, date of birth, ID band Patient awake    Reviewed: Allergy & Precautions, NPO status , Patient's Chart, lab work & pertinent test results  Airway Mallampati: II  TM Distance: >3 FB Neck ROM: Full    Dental no notable dental hx.    Pulmonary neg pulmonary ROS   Pulmonary exam normal        Cardiovascular negative cardio ROS Normal cardiovascular exam     Neuro/Psych  PSYCHIATRIC DISORDERS  Depression    negative neurological ROS     GI/Hepatic Neg liver ROS,GERD  Medicated and Controlled,,  Endo/Other  diabetes  Class 3 obesity  Renal/GU negative Renal ROS     Musculoskeletal   Abdominal  (+) + obese  Peds  Hematology negative hematology ROS (+)   Anesthesia Other Findings LEFT KNEE ACL TEAR, MEDIAL MENISCAL TEAR  Reproductive/Obstetrics Hcg negative                              Anesthesia Physical Anesthesia Plan  ASA: 3  Anesthesia Plan: General and Regional   Post-op Pain Management: Regional block*   Induction: Intravenous  PONV Risk Score and Plan: 3 and Ondansetron, Dexamethasone, Midazolam and Treatment may vary due to age or medical condition  Airway Management Planned: LMA  Additional Equipment:   Intra-op Plan:   Post-operative Plan: Extubation in OR  Informed Consent: I have reviewed the patients History and Physical, chart, labs and discussed the procedure including the risks, benefits and alternatives for the proposed anesthesia with the patient or authorized representative who has indicated his/her understanding and acceptance.     Dental advisory given  Plan Discussed with: CRNA  Anesthesia Plan Comments:         Anesthesia Quick Evaluation

## 2023-09-14 ENCOUNTER — Encounter (HOSPITAL_COMMUNITY): Payer: Self-pay | Admitting: Orthopedic Surgery

## 2023-09-14 DIAGNOSIS — F331 Major depressive disorder, recurrent, moderate: Secondary | ICD-10-CM | POA: Diagnosis not present

## 2023-09-18 DIAGNOSIS — S83242A Other tear of medial meniscus, current injury, left knee, initial encounter: Secondary | ICD-10-CM

## 2023-09-18 DIAGNOSIS — S83512A Sprain of anterior cruciate ligament of left knee, initial encounter: Secondary | ICD-10-CM

## 2023-09-21 ENCOUNTER — Encounter: Payer: Self-pay | Admitting: Orthopedic Surgery

## 2023-09-21 ENCOUNTER — Ambulatory Visit: Payer: Commercial Managed Care - PPO | Admitting: Orthopedic Surgery

## 2023-09-21 DIAGNOSIS — S83242D Other tear of medial meniscus, current injury, left knee, subsequent encounter: Secondary | ICD-10-CM

## 2023-09-21 DIAGNOSIS — S83512A Sprain of anterior cruciate ligament of left knee, initial encounter: Secondary | ICD-10-CM

## 2023-09-21 NOTE — Therapy (Incomplete)
 OUTPATIENT PHYSICAL THERAPY LOWER EXTREMITY EVALUATION   Patient Name: Jasmine Ortega MRN: [id number] DOB:April 20, 1981, 43 y.o., female Today's Date: 09/22/2023  END OF SESSION:  PT End of Session - 09/22/23 0858     Visit Number 1    Number of Visits 20    Date for PT Re-Evaluation 12/01/23    Authorization Type [facility] [employer]    Progress Note Due on Visit 10    PT Start Time 0804    PT Stop Time [phone]    PT Time Calculation (min) 49 min    Activity Tolerance Patient tolerated treatment well;No increased pain    Behavior During Therapy WFL for tasks assessed/performed             Past Medical History:  Diagnosis Date   Birth control    Chicken pox    Depression    GERD (gastroesophageal reflux disease)    Gestational diabetes    Past Surgical History:  Procedure Laterality Date   ALLOGRAFT APPLICATION Left 09/13/2023   Procedure: BONE -PATELLA-TENDON-BONE ALLOGRAFT;  Surgeon: Cammy Copa, MD;  Location: [facility] OR;  Service: Orthopedics;  Laterality: Left;   ANTERIOR CRUCIATE LIGAMENT REPAIR Left 09/13/2023   Procedure: LEFT KNEE ACL TEAR RECONSTRUCTION;  Surgeon: Cammy Copa, MD;  Location: [facility] OR;  Service: Orthopedics;  Laterality: Left;   BONE MARROW ASPIRATION FOR SPINE FUSION ONLY (BMAC) Left 09/13/2023   Procedure: BONE MARROW ASPIRATE CONCENTRATE FROM ILLIAC CREST;  Surgeon: Cammy Copa, MD;  Location: [facility] OR;  Service: Orthopedics;  Laterality: Left;   KNEE ARTHROSCOPY WITH MENISCAL REPAIR Left 09/13/2023   Procedure: MEDIAL MENISCAL TEAR REPAIR;  Surgeon: Cammy Copa, MD;  Location: [facility] OR;  Service: Orthopedics;  Laterality: Left;   LASIK Bilateral    NO PAST SURGERIES     Patient Active Problem List   Diagnosis Date Noted   Left anterior cruciate ligament tear 09/18/2023   Acute medial meniscus tear of left knee 09/18/2023   Pain in left knee 08/03/2023   Chronic low back pain without sciatica 09/23/2021   Dysthymia 09/23/2021    Obesity (BMI 30-39.9) 09/14/2019   Hyperlipidemia 09/14/2019   Gastroesophageal reflux disease 04/27/2017   Attention deficit hyperactivity disorder (ADHD), predominantly inattentive type 03/05/2015   Loss of transverse plantar arch of right foot 09/18/2014    PCP: Ronnald Nian, MD  REFERRING PROVIDER: Cammy Copa, MD  REFERRING DIAG:  [phone] (ICD-10-CM) - Rupture of anterior cruciate ligament of left knee, initial encounter S83.242D (ICD-10-CM) - Other tear of medial meniscus, current injury, left knee, subsequent encounter  THERAPY DIAG:  Muscle weakness (generalized)  Chronic pain of left knee  Stiffness of left knee, not elsewhere classified  Other abnormalities of gait and mobility  Localized edema  Rationale for Evaluation and Treatment: Rehabilitation  ONSET DATE: 09/13/2023 s/p Lt ACL with medial meniscus  SUBJECTIVE:  SUBJECTIVE:   SUBJECTIVE STATEMENT: She was doing martial arts early January which caused the ACL tear. Was going from squat to stand with knee valgus collapse occurred. She reports that MRI showed "chronic" ACL tear.  On 3/18 surgery with Left LEFT KNEE ACL TEAR RECONSTRUCTION , BONE MARROW ASPIRATE CONCENTRATE, Left MEDIAL MENISCAL TEAR REPAIR, Left BONE -PATELLA-TENDON-BONE ALLOGRAFT. Using CPM 2hr 2x a day at 85*   PERTINENT HISTORY: Chronic LBP, HLD, GERD, ADHD  No hx of DVT- ok to do BFR  PAIN:  NPRS scale: 1/10 currently, 8/10 at worst over last week Pain location: Lt  knee Pain description: burinng  Aggravating factors: walking on it Relieving factors: ice, rest, elevation, pain meds- ibprophin, roboxin at bed time  PRECAUTIONS: Other:  ROM & strengthening closed chain; ok to WBAT in 7 days  WEIGHT BEARING RESTRICTIONS: "ok to WBAT in 7 days" per referral 09/28/2023. However will need clarification.   FALLS:  Has patient fallen in last 6 months? 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 No displaced fragment. Lateral meniscus:  Intact.   LIGAMENTS Cruciates:  The ACL is chronically torn.  The PCL is intact. Collaterals:  Intact.  CARTILAGE Patellofemoral:  Preserved. Medial:  Minimally degenerated. Lateral:  Minimally degenerated. Joint:  Small to moderate joint effusion. Popliteal Fossa:  No Baker's cyst. Extensor Mechanism:  Intact.   Bones: No fracture, stress change or worrisome lesion. Small enchondroma distal femur noted   Other: Subcutaneous edema anterior to the patella and patellar tendon compatible bursitis is seen.   IMPRESSION: 1. Longitudinal tear posterior horn and posterior body of the medial meniscus without displaced fragment. 2. Chronic ACL tear. 3. Mild medial and lateral compartment osteoarthritis. 4. Prepatellar bursitis.    PATIENT SURVEYS:  Patient-Specific Activity Scoring Scheme  "0" represents "unable to perform." "10" represents "able to perform at prior level. 0 1 2 3 4 5 6 7 8 9  10 (Date and Score)   Activity Eval     1. Full AROM of knee 1     2. Improved knee stability  1    3. Return to jujitsu 1   4. stairs 1   5. walking 1   Score 1/10    Total score = sum of the activity scores/number of activities Minimum detectable change (90%CI) for average score = 2 points Minimum detectable change (90%CI) for single activity score = 3 points  COGNITION: Overall cognitive status: WFL    SENSATION: WFL  EDEMA:  Not  measured but left knee is larger by observation.  She reports increased with activities.   POSTURE:  No Significant postural limitations  PALPATION: Tender to surgical sites and quadricep  LOWER EXTREMITY ROM:   ROM Right eval Left eval  Hip flexion    Hip extension    Hip abduction    Hip adduction    Hip internal rotation    Hip external rotation    Knee flexion  Supine with strap AA: 84  Knee extension  Supine quad set A: -7  Ankle dorsiflexion    Ankle plantarflexion    Ankle inversion    Ankle eversion     (Blank rows = not tested)  LOWER EXTREMITY MMT:  Lt not tested at eval due to surgery  MMT Right eval Left eval  Hip flexion 5 4  Hip extension    Hip abduction    Hip adduction    Hip internal rotation    Hip external rotation    Knee flexion 5 3-/5  Knee extension 5 3-/5  Ankle  dorsiflexion 5 5  Ankle plantarflexion 5 as seen in gait   Ankle inversion    Ankle eversion     (Blank rows = not tested)  FUNCTIONAL TESTS:  Rises from 18in chair with BUE use on armrests and LLE slightly in front to decrease knee bending  GAIT: 09/22/2023: Distance walked: clinical Assistive device utilized: Crutches Level of assistance: SBA Comments: crutches too short, PWB on LLE using 3pt gait, decreased cadence, decreased step length                                                                                                                                                                        TODAY'S TREATMENT                                                                          DATE: 09/22/2023 Therex:  HEP instruction/performance c cues for techniques, handout provided.  Trial set performed of each for comprehension and symptom assessment.  See below for exercise list  PATIENT EDUCATION:  Education details: HEP, POC Person educated: Patient Education method: Explanation, Demonstration, Verbal cues, and Handouts Education comprehension: verbalized  understanding, returned demonstration, and verbal cues required  HOME EXERCISE PROGRAM: Access Code: [email or web address] URL: [email or web address] Date: 09/22/2023 Prepared by: Vladimir Faster   Exercises - Ankle Alphabet in Elevation  - 2-3 x daily - 7 x weekly - 1 sets - 2-3 reps - elevate 10-15 min hold - Supine Active Straight Leg Raise (Mirrored)  - 2-3 x daily - 7 x weekly - 2 sets - 10 reps - 5 seconds hold - supine quad set with towel roll under ankle  - 2-3 x daily - 7 x weekly - 2 sets - 10 reps - 5 seconds hold - Supine Heel Slide with Strap  - 2-3 x daily - 7 x weekly - 2 sets - 10 reps - 5 seconds hold - seated single straight leg raise   - 2-3 x daily - 7 x weekly - 2 sets - 10 reps - 5 seconds hold - Seated Quad Set  - 2-3 x daily - 7 x weekly - 2 sets - 10 reps - 5 seconds hold - Seated Knee Flexion Extension AROM   - 2-3 x daily - 7 x weekly - 2 sets - 10 reps - 5 seconds hold  ASSESSMENT:  CLINICAL IMPRESSION: Patient is a 44 y.o. who comes to clinic with complaints of Lt knee pain s/p ACL  and meniscus with mobility, strength and movement coordination deficits that impair their ability to perform usual daily and recreational functional activities without increase difficulty/symptoms at this time.  Patient to benefit from skilled PT services to address impairments and limitations to improve to previous level of function without restriction secondary to condition.   OBJECTIVE IMPAIRMENTS: Abnormal gait, decreased activity tolerance, decreased balance, decreased coordination, decreased endurance, decreased knowledge of condition, decreased knowledge of use of DME, decreased mobility, difficulty walking, decreased ROM, decreased strength, increased edema, increased muscle spasms, and pain.   ACTIVITY LIMITATIONS: carrying, lifting, bending, sitting, standing, squatting, stairs, and locomotion level  PARTICIPATION LIMITATIONS: meal prep, cleaning, laundry, driving, shopping,  community activity, and occupation  PERSONAL FACTORS: Profession, Time since onset of injury/illness/exacerbation, and 1-2 comorbidities: see above  are also affecting patient's functional outcome.   REHAB POTENTIAL: Good  CLINICAL DECISION MAKING: Stable/uncomplicated  EVALUATION COMPLEXITY: Low   GOALS: Goals reviewed with patient? Yes  SHORT TERM GOALS: (target date for Short term goals are 3 weeks 10/13/2023)   1.  Patient will demonstrate independent use of home exercise program to maintain progress from in clinic treatments.  Goal status: New  2.  Patient will demonstrate AROM 0-95deg   Goal status: New   LONG TERM GOALS: (target dates for all long term goals are 10 weeks  12/01/2023 )   1. Patient will demonstrate/report pain at worst less than or equal to 2/10 to facilitate minimal limitation in daily activity secondary to pain symptoms.  Goal status: New   2. Patient will demonstrate independent use of home exercise program to facilitate ability to maintain/progress functional gains from skilled physical therapy services.  Goal status: New   3. Patient will demonstrate Patient specific functional scale avg > or = 7 to indicate reduced disability due to condition.   Goal status: New   4.  Patient will demonstrate LLE MMT 5/5 throughout to faciltiate usual transfers, stairs, squatting at [facility] for daily life.   Goal status: New   5.  Patient will demonstrate/report ability to perform 18 inch chair transfer without UE assist.   Goal status: New   6.  Patient will demonstrate up and down a flight of stairs with single hand rail with reciprocal gait pattern.  Goal status: New   7.  patient will demonstrate AROM 0-120 for full range of motion necessary to perform job tasks Goal Status: New   PLAN:  PT FREQUENCY: 3x/wk for 2 weeks, then 1-2x/week  PT DURATION: 10 weeks  PLANNED INTERVENTIONS: Can include 16109- PT Re-evaluation, 97110-Therapeutic exercises,  97530- Therapeutic activity, 97112- Neuromuscular re-education, 97535- Self Care, 97140- Manual therapy, [phone]- Gait training, [phone]- Orthotic Fit/training, [phone]- Canalith repositioning, [id number]- Aquatic Therapy, [phone]- Electrical stimulation (unattended), [phone]- Electrical stimulation (manual), [id number] Physical performance testing, 97016- Vasopneumatic device, [id number]- Ultrasound, [id number]- Traction (mechanical), [id number]- Ionotophoresis 4mg /ml Dexamethasone, Blood Flow Restriction Therapy,  Patient/Family education, Balance training, Stair training, Taping, Dry Needling, Joint mobilization, Joint manipulation, Spinal manipulation, Spinal mobilization, Scar mobilization, Vestibular training, Visual/preceptual remediation/compensation, DME instructions, Cryotherapy, and Moist heat.  All performed as medically necessary.  All included unless contraindicated  PLAN FOR NEXT SESSION: Review HEP knowledge/results. Measure & use BFR, quad strengthening, vaso if needed    Rosealyn Little, Stetson Pelaez, Student-PT 09/22/2023, 9:40 AM  This entire session of physical therapy was performed under the direct supervision of PT signing evaluation /treatment. PT reviewed note and agrees.   Vladimir Faster, PT, DPT 09/22/2023, 1:35 PM

## 2023-09-21 NOTE — Progress Notes (Signed)
 Post-Op Visit Note   Patient: Jasmine Ortega           Date of Birth: 1980/08/31           MRN: [id number] Visit Date: 09/21/2023 PCP: Ronnald Nian, MD   Assessment & Plan:  Chief Complaint:  Chief Complaint  Patient presents with   Left Knee - Pain    P/O from knee surgery Some numbness Surgical site looks good Tori removed sutures 10 removed   Visit Diagnoses:  1. Rupture of anterior cruciate ligament of left knee, initial encounter   2. Other tear of medial meniscus, current injury, left knee, subsequent encounter     Plan: Plan is the patient who is now a week out left knee ACL reconstruction with bone patella tendon bone allograft with the MAC and medial meniscal debridement.  CPM is at 75.  Been partial weightbearing.  On exam no calf tenderness negative Homans.  On aspirin for DVT prophylaxis.  Graft is stable.  Mild effusion is present.  Decision against aspiration today.  Continue partial weightbearing 1 more week.  Patient can do multiple straight leg raises.  Come back in 3 weeks for clinical recheck.  Start physical therapy upstairs 1-2 times a week for range of motion and strengthening primarily close chain exercises.  Okay to weight-bear as tolerated in 7 days.  Based on her excellent quad strength knee immobilizer no longer indicated at this time.  Follow-Up Instructions: No follow-ups on file.   Orders:  Orders Placed This Encounter  Procedures   Ambulatory referral to Physical Therapy   No orders of the defined types were placed in this encounter.   Imaging: No results found.  PMFS History: Patient Active Problem List   Diagnosis Date Noted   Left anterior cruciate ligament tear 09/18/2023   Acute medial meniscus tear of left knee 09/18/2023   Pain in left knee 08/03/2023   Chronic low back pain without sciatica 09/23/2021   Dysthymia 09/23/2021   Obesity (BMI 30-39.9) 09/14/2019   Hyperlipidemia 09/14/2019   Gastroesophageal reflux disease  04/27/2017   Attention deficit hyperactivity disorder (ADHD), predominantly inattentive type 03/05/2015   Loss of transverse plantar arch of right foot 09/18/2014   Past Medical History:  Diagnosis Date   Birth control    Chicken pox    Depression    GERD (gastroesophageal reflux disease)    Gestational diabetes     Family History  Problem Relation Age of Onset   Breast cancer Mother    Cancer Mother 22       Breast cancer   Neuropathy Father    Arthritis Father    Hyperlipidemia Father    Hypertension Father    Stroke Father    Migraines Sister    Mitral valve prolapse Sister    Stroke Maternal Grandmother    Miscarriages / Stillbirths Maternal Grandfather    Arthritis Paternal Grandmother    Heart disease Paternal Grandmother    Diabetes Paternal Grandfather     Past Surgical History:  Procedure Laterality Date   ALLOGRAFT APPLICATION Left 09/13/2023   Procedure: BONE -PATELLA-TENDON-BONE ALLOGRAFT;  Surgeon: Cammy Copa, MD;  Location: [facility] OR;  Service: Orthopedics;  Laterality: Left;   ANTERIOR CRUCIATE LIGAMENT REPAIR Left 09/13/2023   Procedure: LEFT KNEE ACL TEAR RECONSTRUCTION;  Surgeon: Cammy Copa, MD;  Location: [facility] OR;  Service: Orthopedics;  Laterality: Left;   BONE MARROW ASPIRATION FOR SPINE FUSION ONLY (BMAC) Left 09/13/2023   Procedure:  BONE MARROW ASPIRATE CONCENTRATE FROM ILLIAC CREST;  Surgeon: Cammy Copa, MD;  Location: [facility] OR;  Service: Orthopedics;  Laterality: Left;   KNEE ARTHROSCOPY WITH MENISCAL REPAIR Left 09/13/2023   Procedure: MEDIAL MENISCAL TEAR REPAIR;  Surgeon: Cammy Copa, MD;  Location: [facility] OR;  Service: Orthopedics;  Laterality: Left;   LASIK Bilateral    NO PAST SURGERIES     Social History   Occupational History   Not on file  Tobacco Use   Smoking status: Never   Smokeless tobacco: Never  Vaping Use   Vaping status: Never Used  Substance and Sexual Activity   Alcohol use: Yes    Alcohol/week: 2.0  standard drinks of alcohol    Types: 2 Glasses of wine per week    Comment: occas   Drug use: No   Sexual activity: Yes

## 2023-09-22 ENCOUNTER — Encounter: Payer: Self-pay | Admitting: Physical Therapy

## 2023-09-22 ENCOUNTER — Ambulatory Visit: Admitting: Physical Therapy

## 2023-09-22 DIAGNOSIS — M25662 Stiffness of left knee, not elsewhere classified: Secondary | ICD-10-CM | POA: Diagnosis not present

## 2023-09-22 DIAGNOSIS — M25562 Pain in left knee: Secondary | ICD-10-CM | POA: Diagnosis not present

## 2023-09-22 DIAGNOSIS — M6281 Muscle weakness (generalized): Secondary | ICD-10-CM

## 2023-09-22 DIAGNOSIS — R600 Localized edema: Secondary | ICD-10-CM | POA: Diagnosis not present

## 2023-09-22 DIAGNOSIS — G8929 Other chronic pain: Secondary | ICD-10-CM

## 2023-09-22 DIAGNOSIS — R2689 Other abnormalities of gait and mobility: Secondary | ICD-10-CM | POA: Diagnosis not present

## 2023-09-27 ENCOUNTER — Ambulatory Visit (INDEPENDENT_AMBULATORY_CARE_PROVIDER_SITE_OTHER): Admitting: Physical Therapy

## 2023-09-27 ENCOUNTER — Encounter: Payer: Self-pay | Admitting: Physical Therapy

## 2023-09-27 DIAGNOSIS — G8929 Other chronic pain: Secondary | ICD-10-CM | POA: Diagnosis not present

## 2023-09-27 DIAGNOSIS — M25562 Pain in left knee: Secondary | ICD-10-CM | POA: Diagnosis not present

## 2023-09-27 DIAGNOSIS — R2689 Other abnormalities of gait and mobility: Secondary | ICD-10-CM

## 2023-09-27 DIAGNOSIS — R600 Localized edema: Secondary | ICD-10-CM

## 2023-09-27 DIAGNOSIS — M25662 Stiffness of left knee, not elsewhere classified: Secondary | ICD-10-CM

## 2023-09-27 DIAGNOSIS — M6281 Muscle weakness (generalized): Secondary | ICD-10-CM

## 2023-09-27 NOTE — Therapy (Signed)
 OUTPATIENT PHYSICAL THERAPY LOWER EXTREMITY TREATMENT   Patient Name: STACEYANN KNOUFF MRN: [id number] DOB:1980-08-18, 43 y.o., female Today's Date: 09/27/2023  END OF SESSION:  PT End of Session - 09/27/23 0800     Visit Number 2    Number of Visits 20    Date for PT Re-Evaluation 12/01/23    Authorization Type [place] [employer]    Progress Note Due on Visit 10    PT Start Time 0800    PT Stop Time 0857    PT Time Calculation (min) 57 min    Activity Tolerance Patient tolerated treatment well;No increased pain    Behavior During Therapy WFL for tasks assessed/performed              Past Medical History:  Diagnosis Date   Birth control    Chicken pox    Depression    GERD (gastroesophageal reflux disease)    Gestational diabetes    Past Surgical History:  Procedure Laterality Date   ALLOGRAFT APPLICATION Left 09/13/2023   Procedure: BONE -PATELLA-TENDON-BONE ALLOGRAFT;  Surgeon: Cammy Copa, MD;  Location: [facility] OR;  Service: Orthopedics;  Laterality: Left;   ANTERIOR CRUCIATE LIGAMENT REPAIR Left 09/13/2023   Procedure: LEFT KNEE ACL TEAR RECONSTRUCTION;  Surgeon: Cammy Copa, MD;  Location: [facility] OR;  Service: Orthopedics;  Laterality: Left;   BONE MARROW ASPIRATION FOR SPINE FUSION ONLY (BMAC) Left 09/13/2023   Procedure: BONE MARROW ASPIRATE CONCENTRATE FROM ILLIAC CREST;  Surgeon: Cammy Copa, MD;  Location: [facility] OR;  Service: Orthopedics;  Laterality: Left;   KNEE ARTHROSCOPY WITH MENISCAL REPAIR Left 09/13/2023   Procedure: MEDIAL MENISCAL TEAR REPAIR;  Surgeon: Cammy Copa, MD;  Location: [facility] OR;  Service: Orthopedics;  Laterality: Left;   LASIK Bilateral    NO PAST SURGERIES     Patient Active Problem List   Diagnosis Date Noted   Left anterior cruciate ligament tear 09/18/2023   Acute medial meniscus tear of left knee 09/18/2023   Pain in left knee 08/03/2023   Chronic low back pain without sciatica 09/23/2021   Dysthymia 09/23/2021    Obesity (BMI 30-39.9) 09/14/2019   Hyperlipidemia 09/14/2019   Gastroesophageal reflux disease 04/27/2017   Attention deficit hyperactivity disorder (ADHD), predominantly inattentive type 03/05/2015   Loss of transverse plantar arch of right foot 09/18/2014    PCP: Ronnald Nian, MD  REFERRING PROVIDER: Cammy Copa, MD  REFERRING DIAG:  [phone] (ICD-10-CM) - Rupture of anterior cruciate ligament of left knee, initial encounter S83.242D (ICD-10-CM) - Other tear of medial meniscus, current injury, left knee, subsequent encounter  THERAPY DIAG:  Muscle weakness (generalized)  Chronic pain of left knee  Stiffness of left knee, not elsewhere classified  Other abnormalities of gait and mobility  Localized edema  Rationale for Evaluation and Treatment: Rehabilitation  ONSET DATE: 09/13/2023 s/p Lt ACL with medial meniscus  SUBJECTIVE:   SUBJECTIVE STATEMENT: She has been doing her exercises which are getting better.  She is ambulating with crutches which are better since PT eval.  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  PERTINENT HISTORY: 3/18 surgery with Left LEFT KNEE ACL TEAR RECONSTRUCTION , BONE MARROW ASPIRATE CONCENTRATE, Left MEDIAL MENISCAL TEAR REPAIR, Left BONE -PATELLA-TENDON-BONE ALLOGRAFT Chronic LBP, HLD, GERD, ADHD  No hx of DVT- ok to do BFR  PAIN:  NPRS scale: 1/10 currently, 8/10 at worst over last week Pain location: Lt knee Pain description:  burinng  Aggravating factors: walking on it Relieving factors: ice, rest, elevation, pain meds- ibprophin, roboxin at bed time  PRECAUTIONS: Other:  ROM & strengthening closed chain; ok to WBAT in 7 days  WEIGHT BEARING RESTRICTIONS: "ok to WBAT in 7 days" per referral 09/28/2023. However will need clarification.   FALLS:  Has patient fallen in last 6 months? 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 No displaced fragment. Lateral meniscus:  Intact.   LIGAMENTS Cruciates:  The ACL is chronically torn.  The PCL is intact. Collaterals:  Intact.  CARTILAGE Patellofemoral:  Preserved. Medial:  Minimally degenerated. Lateral:  Minimally degenerated. Joint:  Small to moderate joint effusion. Popliteal Fossa:  No Baker's cyst. Extensor Mechanism:  Intact.   Bones: No fracture, stress change or worrisome lesion. Small enchondroma distal femur noted   Other: Subcutaneous edema anterior to the patella and patellar tendon compatible bursitis is seen.   IMPRESSION: 1. Longitudinal tear posterior horn and posterior body of the medial meniscus without displaced fragment. 2. Chronic ACL tear. 3. Mild medial and lateral compartment osteoarthritis. 4. Prepatellar bursitis.    PATIENT SURVEYS:  Patient-Specific Activity Scoring Scheme  "0" represents "unable to perform." "10" represents "able to perform at prior level. 0 1 2 3 4 5 6 7 8 9  10 (Date and Score)   Activity Eval 09/22/23    1. Full AROM of knee 1     2. Improved knee stability  1    3. Return to jujitsu 1   4. stairs 1   5. walking 1   Score 1/10    Total score = sum of the activity scores/number of activities Minimum detectable change (90%CI) for average score = 2 points Minimum detectable change (90%CI) for single activity score = 3 points  COGNITION: Evaluation on 09/22/2023:   Overall cognitive status: WFL    SENSATION: Evaluation on  09/22/2023:   [facility]  EDEMA:  Evaluation on 09/22/2023:  Not measured but left knee is larger by observation.  She reports increased with activities.   POSTURE:  Evaluation on 09/22/2023:  No Significant postural limitations  PALPATION: Evaluation on 09/22/2023:  Tender to surgical sites and quadricep  LOWER EXTREMITY ROM:   ROM Left Eval 09/22/2023  Hip flexion   Hip extension   Hip abduction   Hip adduction   Hip internal rotation   Hip external rotation   Knee flexion Supine with strap AA: 84  Knee extension Supine quad set A: -7  Ankle dorsiflexion   Ankle plantarflexion   Ankle inversion   Ankle eversion    (Blank rows = not tested)  LOWER EXTREMITY MMT:  Lt not tested at eval due to surgery  MMT Right Eval 09/22/23 Left Eval 09/22/23  Hip flexion 5 4  Hip extension    Hip abduction    Hip adduction    Hip internal rotation    Hip external rotation    Knee flexion 5  3-/5  Knee extension 5 3-/5  Ankle dorsiflexion 5 5  Ankle plantarflexion 5 as seen in gait   Ankle inversion    Ankle eversion     (Blank rows = not tested)  FUNCTIONAL TESTS:  Evaluation on 09/22/2023: Rises from 18in chair with BUE use on armrests and LLE slightly in front to decrease knee bending  GAIT: Evaluation on 09/22/2023: Distance walked: clinical Assistive device utilized: Crutches Level of assistance: SBA Comments: crutches too short, PWB on LLE using 3pt gait, decreased cadence, decreased step length                                                                                                                                                                        TODAY'S TREATMENT                                                                          DATE: 09/27/2023 Therapeutic Exercise: Blood Flow Restriction Therapy  seated occulusion 210 work at 80% (168) LLE LAQ protocol 30 reps-15-15-15 with 30 second rest  Standing LLE TKE green theraband 5 sec hold 15 reps 2 sets BUE  crutch support  Therapeutic Activities:  Leg press BLEs 75# 15 reps 2 sets Pt amb with axillary crutches LLE PWB step through pattern no nonverbal signs of pain.   Pt neg curb with axillary crutches LLE PWB safely.  Pt questioning sending CPM back as paying OOP weekly.  Pt said okay to return CPM as long as does knee flexion range exercises consistently.  Pt verbalized understanding.   Vaso left knee with elevation 34* high compression 10 min   TREATMENT                                                                          DATE: 09/22/2023 Therex:  HEP instruction/performance c cues for techniques, handout provided.  Trial set performed of each for comprehension and symptom assessment.  See below for exercise list  PATIENT EDUCATION:  Education details: HEP, POC Person educated: Patient Education method: Explanation, Demonstration, Verbal cues, and Handouts Education comprehension: verbalized understanding, returned demonstration, and verbal cues required  HOME EXERCISE PROGRAM: Access Code: [email or web address] URL: [email or web address]  Date: 09/22/2023 Prepared by: Vladimir Faster   Exercises - Ankle Alphabet in Elevation  - 2-3 x daily - 7 x weekly - 1 sets - 2-3 reps - elevate 10-15 min hold - Supine Active Straight Leg Raise (Mirrored)  - 2-3 x daily - 7 x weekly - 2 sets - 10 reps - 5 seconds hold - supine quad set with towel roll under ankle  - 2-3 x daily - 7 x weekly - 2 sets - 10 reps - 5 seconds hold - Supine Heel Slide with Strap  - 2-3 x daily - 7 x weekly - 2 sets - 10 reps - 5 seconds hold - seated single straight leg raise   - 2-3 x daily - 7 x weekly - 2 sets - 10 reps - 5 seconds hold - Seated Quad Set  - 2-3 x daily - 7 x weekly - 2 sets - 10 reps - 5 seconds hold - Seated Knee Flexion Extension AROM   - 2-3 x daily - 7 x weekly - 2 sets - 10 reps - 5 seconds hold  ASSESSMENT:  CLINICAL IMPRESSION: Patient responded well to Blood Flow Restriction exercises  for strengthening without weights.  Patient has weakness in quads but can activate better than at evaluation.  Pt continues to benefit from skilled PT.     OBJECTIVE IMPAIRMENTS: Abnormal gait, decreased activity tolerance, decreased balance, decreased coordination, decreased endurance, decreased knowledge of condition, decreased knowledge of use of DME, decreased mobility, difficulty walking, decreased ROM, decreased strength, increased edema, increased muscle spasms, and pain.   ACTIVITY LIMITATIONS: carrying, lifting, bending, sitting, standing, squatting, stairs, and locomotion level  PARTICIPATION LIMITATIONS: meal prep, cleaning, laundry, driving, shopping, community activity, and occupation  PERSONAL FACTORS: Profession, Time since onset of injury/illness/exacerbation, and 1-2 comorbidities: see above  are also affecting patient's functional outcome.   REHAB POTENTIAL: Good  CLINICAL DECISION MAKING: Stable/uncomplicated  EVALUATION COMPLEXITY: Low   GOALS: Goals reviewed with patient? Yes  SHORT TERM GOALS: (target date for Short term goals are 3 weeks 10/13/2023)   1.  Patient will demonstrate independent use of home exercise program to maintain progress from in clinic treatments.  Goal status: Ongoing  09/27/2023  2.  Patient will demonstrate AROM 0-95deg   Goal status: Ongoing  09/27/2023   LONG TERM GOALS: (target dates for all long term goals are 10 weeks  12/01/2023 )   1. Patient will demonstrate/report pain at worst less than or equal to 2/10 to facilitate minimal limitation in daily activity secondary to pain symptoms.  Goal status: Ongoing  09/27/2023   2. Patient will demonstrate independent use of home exercise program to facilitate ability to maintain/progress functional gains from skilled physical therapy services.  Goal status: Ongoing  09/27/2023   3. Patient will demonstrate Patient specific functional scale avg > or = 7 to indicate reduced disability due to  condition.   Goal status: Ongoing  09/27/2023   4.  Patient will demonstrate LLE MMT 5/5 throughout to faciltiate usual transfers, stairs, squatting at [facility] for daily life.   Goal status: Ongoing  09/27/2023   5.  Patient will demonstrate/report ability to perform 18 inch chair transfer without UE assist.   Goal status: Ongoing  09/27/2023   6.  Patient will demonstrate up and down a flight of stairs with single hand rail with reciprocal gait pattern.  Goal status: Ongoing  09/27/2023   7.  patient will demonstrate AROM 0-120 for full range of motion necessary  to perform job tasks Goal Status: Ongoing  09/27/2023   PLAN:  PT FREQUENCY: 3x/wk for 2 weeks, then 1-2x/week  PT DURATION: 10 weeks  PLANNED INTERVENTIONS: Can include [id number]- PT Re-evaluation, 97110-Therapeutic exercises, 97530- Therapeutic activity, 97112- Neuromuscular re-education, 97535- Self Care, 97140- Manual therapy, [phone]- Gait training, [phone]- Orthotic Fit/training, [phone]- Canalith repositioning, [id number]- Aquatic Therapy, [phone]- Electrical stimulation (unattended), [phone]- Electrical stimulation (manual), [id number] Physical performance testing, 97016- Vasopneumatic device, [id number]- Ultrasound, [id number]- Traction (mechanical), [id number]- Ionotophoresis 4mg /ml Dexamethasone, Blood Flow Restriction Therapy,  Patient/Family education, Balance training, Stair training, Taping, Dry Needling, Joint mobilization, Joint manipulation, Spinal manipulation, Spinal mobilization, Scar mobilization, Vestibular training, Visual/preceptual remediation/compensation, DME instructions, Cryotherapy, and Moist heat.  All performed as medically necessary.  All included unless contraindicated  PLAN FOR NEXT SESSION: use BFR protocol with LAQ and add seated SLR (release BFR between doing another exercise), quad strengthening, vaso as needed   Vladimir Faster, PT, DPT 09/27/2023, 9:20 AM

## 2023-09-28 DIAGNOSIS — F331 Major depressive disorder, recurrent, moderate: Secondary | ICD-10-CM | POA: Diagnosis not present

## 2023-09-29 ENCOUNTER — Other Ambulatory Visit (HOSPITAL_COMMUNITY): Payer: Self-pay

## 2023-09-29 ENCOUNTER — Ambulatory Visit: Admitting: Rehabilitative and Restorative Service Providers"

## 2023-09-29 ENCOUNTER — Encounter: Payer: Self-pay | Admitting: Rehabilitative and Restorative Service Providers"

## 2023-09-29 DIAGNOSIS — G8929 Other chronic pain: Secondary | ICD-10-CM | POA: Diagnosis not present

## 2023-09-29 DIAGNOSIS — R600 Localized edema: Secondary | ICD-10-CM | POA: Diagnosis not present

## 2023-09-29 DIAGNOSIS — M25662 Stiffness of left knee, not elsewhere classified: Secondary | ICD-10-CM

## 2023-09-29 DIAGNOSIS — M25562 Pain in left knee: Secondary | ICD-10-CM

## 2023-09-29 DIAGNOSIS — M6281 Muscle weakness (generalized): Secondary | ICD-10-CM | POA: Diagnosis not present

## 2023-09-29 DIAGNOSIS — R2689 Other abnormalities of gait and mobility: Secondary | ICD-10-CM | POA: Diagnosis not present

## 2023-09-29 NOTE — Therapy (Signed)
 OUTPATIENT PHYSICAL THERAPY LOWER EXTREMITY TREATMENT   Patient Name: Jasmine Ortega MRN: [id number] DOB:23-Aug-1980, 43 y.o., female Today's Date: 09/29/2023  END OF SESSION:  PT End of Session - 09/29/23 0847     Visit Number 3    Number of Visits 20    Date for PT Re-Evaluation 12/01/23    Authorization Type [place] [employer]    Progress Note Due on Visit 10    PT Start Time [phone]    PT Stop Time [phone]    PT Time Calculation (min) 52 min    Activity Tolerance Patient tolerated treatment well;No increased pain    Behavior During Therapy WFL for tasks assessed/performed               Past Medical History:  Diagnosis Date   Birth control    Chicken pox    Depression    GERD (gastroesophageal reflux disease)    Gestational diabetes    Past Surgical History:  Procedure Laterality Date   ALLOGRAFT APPLICATION Left 09/13/2023   Procedure: BONE -PATELLA-TENDON-BONE ALLOGRAFT;  Surgeon: Cammy Copa, MD;  Location: [facility] OR;  Service: Orthopedics;  Laterality: Left;   ANTERIOR CRUCIATE LIGAMENT REPAIR Left 09/13/2023   Procedure: LEFT KNEE ACL TEAR RECONSTRUCTION;  Surgeon: Cammy Copa, MD;  Location: [facility] OR;  Service: Orthopedics;  Laterality: Left;   BONE MARROW ASPIRATION FOR SPINE FUSION ONLY (BMAC) Left 09/13/2023   Procedure: BONE MARROW ASPIRATE CONCENTRATE FROM ILLIAC CREST;  Surgeon: Cammy Copa, MD;  Location: [facility] OR;  Service: Orthopedics;  Laterality: Left;   KNEE ARTHROSCOPY WITH MENISCAL REPAIR Left 09/13/2023   Procedure: MEDIAL MENISCAL TEAR REPAIR;  Surgeon: Cammy Copa, MD;  Location: [facility] OR;  Service: Orthopedics;  Laterality: Left;   LASIK Bilateral    NO PAST SURGERIES     Patient Active Problem List   Diagnosis Date Noted   Left anterior cruciate ligament tear 09/18/2023   Acute medial meniscus tear of left knee 09/18/2023   Pain in left knee 08/03/2023   Chronic low back pain without sciatica 09/23/2021   Dysthymia 09/23/2021    Obesity (BMI 30-39.9) 09/14/2019   Hyperlipidemia 09/14/2019   Gastroesophageal reflux disease 04/27/2017   Attention deficit hyperactivity disorder (ADHD), predominantly inattentive type 03/05/2015   Loss of transverse plantar arch of right foot 09/18/2014    PCP: Ronnald Nian, MD  REFERRING PROVIDER: Cammy Copa, MD  REFERRING DIAG:  [phone] (ICD-10-CM) - Rupture of anterior cruciate ligament of left knee, initial encounter S83.242D (ICD-10-CM) - Other tear of medial meniscus, current injury, left knee, subsequent encounter  THERAPY DIAG:  Muscle weakness (generalized)  Chronic pain of left knee  Stiffness of left knee, not elsewhere classified  Other abnormalities of gait and mobility  Localized edema  Rationale for Evaluation and Treatment: Rehabilitation  ONSET DATE: 09/13/2023 s/p Lt ACL with medial meniscus  SUBJECTIVE:   SUBJECTIVE STATEMENT: Larita Fife rates her HEP compliance as a "B.  Doing some, could be better."  PERTINENT HISTORY: 3/18 surgery with Left LEFT KNEE ACL TEAR RECONSTRUCTION , BONE MARROW ASPIRATE CONCENTRATE, Left MEDIAL MENISCAL TEAR REPAIR, Left BONE -PATELLA-TENDON-BONE ALLOGRAFT Chronic LBP, HLD, GERD, ADHD  No hx of DVT- ok to do BFR  PAIN:  NPRS scale: 0-2/10 this week Pain location: Lt knee Pain description: burning, numbness Aggravating factors: too much WB, prolonged postures Relieving factors: ice, rest, elevation, pain meds- ibprophin, roboxin at bed time  PRECAUTIONS: Other:  ROM & strengthening closed chain;  ok to WBAT in 7 days  WEIGHT BEARING RESTRICTIONS: "ok to WBAT in 7 days" per referral 09/28/2023. However will need clarification.   FALLS:  Has patient fallen in last 6 months? 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 No displaced fragment. Lateral meniscus:  Intact.   LIGAMENTS Cruciates:  The ACL is chronically torn.  The PCL is intact. Collaterals:  Intact.  CARTILAGE Patellofemoral:  Preserved. Medial:  Minimally degenerated. Lateral:  Minimally degenerated. Joint:  Small to moderate joint effusion. Popliteal Fossa:  No Baker's cyst. Extensor Mechanism:  Intact.   Bones: No fracture, stress change or worrisome lesion. Small enchondroma distal femur noted   Other: Subcutaneous edema anterior to the patella and patellar tendon compatible bursitis is seen.   IMPRESSION: 1. Longitudinal tear posterior horn and posterior body of the medial meniscus without displaced fragment. 2. Chronic ACL tear. 3. Mild medial and lateral compartment osteoarthritis. 4. Prepatellar bursitis.    PATIENT SURVEYS:  Patient-Specific Activity Scoring Scheme  "0" represents "unable to perform." "10" represents "able to perform at prior level. 0 1 2 3 4 5 6 7 8 9  10 (Date and Score)   Activity Eval 09/22/23    1. Full AROM of knee 1     2. Improved knee stability  1    3. Return to jujitsu 1   4. stairs 1   5. walking 1   Score 1/10    Total score = sum of the activity scores/number of activities Minimum detectable change (90%CI) for average score = 2 points Minimum detectable change (90%CI) for single activity score = 3 points  COGNITION: Evaluation on 09/22/2023:   Overall cognitive status: WFL    SENSATION: Evaluation on 09/22/2023:   [facility]  EDEMA:  Evaluation on 09/22/2023:  Not measured but left knee is larger by observation.  She reports increased  with activities.   POSTURE:  Evaluation on 09/22/2023:  No Significant postural limitations  PALPATION: Evaluation on 09/22/2023:  Tender to surgical sites and quadricep  LOWER EXTREMITY ROM:   ROM Left Eval 09/22/2023  Hip flexion   Hip extension   Hip abduction   Hip adduction   Hip internal rotation   Hip external rotation   Knee flexion Supine with strap AA: 84  Knee extension Supine quad set A: -7  Ankle dorsiflexion   Ankle plantarflexion   Ankle inversion   Ankle eversion    (Blank rows = not tested)  LOWER EXTREMITY MMT:  Lt not tested at eval due to surgery  MMT Right Eval 09/22/23 Left Eval 09/22/23  Hip flexion 5 4  Hip extension    Hip abduction    Hip adduction    Hip internal rotation    Hip external rotation    Knee flexion 5 3-/5  Knee extension 5 3-/5  Ankle dorsiflexion 5 5  Ankle plantarflexion 5 as seen in gait   Ankle inversion    Ankle eversion     (  Blank rows = not tested)  FUNCTIONAL TESTS:  Evaluation on 09/22/2023: Rises from 18in chair with BUE use on armrests and LLE slightly in front to decrease knee bending  GAIT: Evaluation on 09/22/2023: Distance walked: clinical Assistive device utilized: Crutches Level of assistance: SBA Comments: crutches too short, PWB on LLE using 3pt gait, decreased cadence, decreased step length                                                                                                                                                                        TODAY'S TREATMENT                                                                          DATE:  09/29/2023 Seated straight leg raises 2 sets of 5 for 3 seconds Quad sets with BFR 168 mm Hg 30/15/15/15 reps with 1 minute rest, 1st set supine bilateral, last 3 sets seated Reviewed HEP with Larita Fife  Neuromuscular re-education: Tandem balance with most weight on the uninvolved right side 2 x 20 seconds each   09/27/2023 Therapeutic  Exercise: Blood Flow Restriction Therapy  seated occulusion 210 work at 80% (168) LLE LAQ protocol 30 reps-15-15-15 with 30 second rest  Standing LLE TKE green theraband 5 sec hold 15 reps 2 sets BUE crutch support  Therapeutic Activities:  Leg press BLEs 75# 15 reps 2 sets Pt amb with axillary crutches LLE PWB step through pattern no nonverbal signs of pain.   Pt neg curb with axillary crutches LLE PWB safely.  Pt questioning sending CPM back as paying OOP weekly.  Pt said okay to return CPM as long as does knee flexion range exercises consistently.  Pt verbalized understanding.   Vaso left knee with elevation 34* high compression 10 min   09/22/2023 Therex:  HEP instruction/performance c cues for techniques, handout provided.  Trial set performed of each for comprehension and symptom assessment.  See below for exercise list  PATIENT EDUCATION:  Education details: HEP, POC Person educated: Patient Education method: Explanation, Demonstration, Verbal cues, and Handouts Education comprehension: verbalized understanding, returned demonstration, and verbal cues required  HOME EXERCISE PROGRAM: Access Code: [email or web address] URL: [email or web address] Date: 09/29/2023 Prepared by: Pauletta Browns  Exercises - Ankle Alphabet in Elevation  - 2-3 x daily - 7 x weekly - 1 sets - 2-3 reps - elevate 10-15 min hold - Supine Active Straight Leg Raise (Mirrored)  - 2-3 x daily - 7 x weekly -  2 sets - 10 reps - 5 seconds hold - supine quad set with towel roll under ankle  - 2-3 x daily - 7 x weekly - 2 sets - 10 reps - 5 seconds hold - Supine Heel Slide with Strap  - 2-3 x daily - 7 x weekly - 2 sets - 10 reps - 5 seconds hold - seated single straight leg raise   - 2-3 x daily - 7 x weekly - 2 sets - 10 reps - 5 seconds hold - Seated Quad Set  - 2-3 x daily - 7 x weekly - 2 sets - 10 reps - 5 seconds hold - Seated Knee Flexion Extension AROM   - 2-3 x daily - 7 x weekly - 2 sets - 10 reps -  5 seconds hold  ASSESSMENT:  CLINICAL IMPRESSION: Elga is doing a good job < 3 weeks post-surgery.  Quadriceps strength and review of her current HEP wsa the focus today.  We discussed the need for more hamstrings strengthening activities to complement her current quadriceps strength activities and to protect the healing reconstruction.  Continue her current plan of care.  OBJECTIVE IMPAIRMENTS: Abnormal gait, decreased activity tolerance, decreased balance, decreased coordination, decreased endurance, decreased knowledge of condition, decreased knowledge of use of DME, decreased mobility, difficulty walking, decreased ROM, decreased strength, increased edema, increased muscle spasms, and pain.   ACTIVITY LIMITATIONS: carrying, lifting, bending, sitting, standing, squatting, stairs, and locomotion level  PARTICIPATION LIMITATIONS: meal prep, cleaning, laundry, driving, shopping, community activity, and occupation  PERSONAL FACTORS: Profession, Time since onset of injury/illness/exacerbation, and 1-2 comorbidities: see above  are also affecting patient's functional outcome.   REHAB POTENTIAL: Good  CLINICAL DECISION MAKING: Stable/uncomplicated  EVALUATION COMPLEXITY: Low   GOALS: Goals reviewed with patient? Yes  SHORT TERM GOALS: (target date for Short term goals are 3 weeks 10/13/2023)   1.  Patient will demonstrate independent use of home exercise program to maintain progress from in clinic treatments.  Goal status: Ongoing  09/29/2023  2.  Patient will demonstrate AROM 0-95deg   Goal status: Ongoing  09/27/2023   LONG TERM GOALS: (target dates for all long term goals are 10 weeks  12/01/2023 )   1. Patient will demonstrate/report pain at worst less than or equal to 2/10 to facilitate minimal limitation in daily activity secondary to pain symptoms.  Goal status: Partially Met 09/29/2023   2. Patient will demonstrate independent use of home exercise program to facilitate ability to  maintain/progress functional gains from skilled physical therapy services.  Goal status: Ongoing  09/27/2023   3. Patient will demonstrate Patient specific functional scale avg > or = 7 to indicate reduced disability due to condition.   Goal status: Ongoing  09/27/2023   4.  Patient will demonstrate LLE MMT 5/5 throughout to faciltiate usual transfers, stairs, squatting at [facility] for daily life.   Goal status: Ongoing  09/27/2023   5.  Patient will demonstrate/report ability to perform 18 inch chair transfer without UE assist.   Goal status: Ongoing  09/27/2023   6.  Patient will demonstrate up and down a flight of stairs with single hand rail with reciprocal gait pattern.  Goal status: Ongoing  09/27/2023   7.  patient will demonstrate AROM 0-120 for full range of motion necessary to perform job tasks Goal Status: Ongoing  09/27/2023   PLAN:  PT FREQUENCY: 3x/wk for 2 weeks, then 1-2x/week  PT DURATION: 10 weeks  PLANNED INTERVENTIONS: Can include 78469- PT Re-evaluation,  97110-Therapeutic exercises, 97530- Therapeutic activity, [id number]- Neuromuscular re-education, [phone]- Self Care, 98119- Manual therapy, [phone]- Gait training, [phone]- Orthotic Fit/training, [phone]- Canalith repositioning, [phone]- Aquatic Therapy, [phone]- Electrical stimulation (unattended), [phone]- Electrical stimulation (manual), [id number] Physical performance testing, [id number]- Vasopneumatic device, [id number]- Ultrasound, [id number]- Traction (mechanical), [id number]- Ionotophoresis 4mg /ml Dexamethasone, Blood Flow Restriction Therapy,  Patient/Family education, Balance training, Stair training, Taping, Dry Needling, Joint mobilization, Joint manipulation, Spinal manipulation, Spinal mobilization, Scar mobilization, Vestibular training, Visual/preceptual remediation/compensation, DME instructions, Cryotherapy, and Moist heat.  All performed as medically necessary.  All included unless contraindicated  PLAN FOR NEXT SESSION: Use BFR protocol with quad sets,  seated SLR or LAQ (release BFR between doing another exercise), quad and introduce hamstrings strengthening, vaso as needed   Cherlyn Cushing, PT, MPT 09/29/2023, 9:40 AM

## 2023-09-30 ENCOUNTER — Ambulatory Visit: Admitting: Rehabilitative and Restorative Service Providers"

## 2023-09-30 ENCOUNTER — Encounter: Payer: Self-pay | Admitting: Rehabilitative and Restorative Service Providers"

## 2023-09-30 DIAGNOSIS — M25562 Pain in left knee: Secondary | ICD-10-CM | POA: Diagnosis not present

## 2023-09-30 DIAGNOSIS — M25662 Stiffness of left knee, not elsewhere classified: Secondary | ICD-10-CM | POA: Diagnosis not present

## 2023-09-30 DIAGNOSIS — R2689 Other abnormalities of gait and mobility: Secondary | ICD-10-CM | POA: Diagnosis not present

## 2023-09-30 DIAGNOSIS — G8929 Other chronic pain: Secondary | ICD-10-CM | POA: Diagnosis not present

## 2023-09-30 DIAGNOSIS — M6281 Muscle weakness (generalized): Secondary | ICD-10-CM | POA: Diagnosis not present

## 2023-09-30 DIAGNOSIS — R600 Localized edema: Secondary | ICD-10-CM | POA: Diagnosis not present

## 2023-09-30 NOTE — Therapy (Signed)
 OUTPATIENT PHYSICAL THERAPY LOWER EXTREMITY TREATMENT   Patient Name: RALYN STLAURENT MRN: [id number] DOB:[birth year], 43 y.o., female Today's Date: 09/30/2023  END OF SESSION:  PT End of Session - 09/30/23 1305     Visit Number 4    Number of Visits 20    Date for PT Re-Evaluation 12/01/23    Authorization Type Page [employer]    Progress Note Due on Visit 10    PT Start Time 1300    PT Stop Time 1348    PT Time Calculation (min) 48 min    Activity Tolerance Patient tolerated treatment well;No increased pain    Behavior During Therapy WFL for tasks assessed/performed                Past Medical History:  Diagnosis Date   Birth control    Chicken pox    Depression    GERD (gastroesophageal reflux disease)    Gestational diabetes    Past Surgical History:  Procedure Laterality Date   ALLOGRAFT APPLICATION Left 09/13/2023   Procedure: BONE -PATELLA-TENDON-BONE ALLOGRAFT;  Surgeon: Cammy Copa, MD;  Location: [facility] OR;  Service: Orthopedics;  Laterality: Left;   ANTERIOR CRUCIATE LIGAMENT REPAIR Left 09/13/2023   Procedure: LEFT KNEE ACL TEAR RECONSTRUCTION;  Surgeon: Cammy Copa, MD;  Location[facility] OR;  Service: Orthopedics;  Laterality: Left;   BONE MARROW ASPIRATION FOR SPINE FUSION ONLY (BMAC) Left 09/13/2023   Procedure: BONE MARROW ASPIRATE CONCENTRATE FROM ILLIAC CREST;  Surgeon: Cammy Copa, MD;  Location: [facility] OR;  Service: Orthopedics;  Laterality: Left;   KNEE ARTHROSCOPY WITH MENISCAL REPAIR Left 09/13/2023   Procedure: MEDIAL MENISCAL TEAR REPAIR;  Surgeon: Cammy Copa, MD;  Location: [facility] OR;  Service: Orthopedics;  Laterality: Left;   LASIK Bilateral    NO PAST SURGERIES     Patient Active Problem List   Diagnosis Date Noted   Left anterior cruciate ligament tear 09/18/2023   Acute medial meniscus tear of left knee 09/18/2023   Pain in left knee 08/03/2023   Chronic low back pain without sciatica 09/23/2021   Dysthymia 09/23/2021    Obesity (BMI 30-39.9) 09/14/2019   Hyperlipidemia 09/14/2019   Gastroesophageal reflux disease 04/27/2017   Attention deficit hyperactivity disorder (ADHD), predominantly inattentive type 03/05/2015   Loss of transverse plantar arch of right foot 09/18/2014    PCP: Ronnald Nian, MD  REFERRING PROVIDER: Cammy Copa, MD  REFERRING DIAG:  [phone] (ICD-10-CM) - Rupture of anterior cruciate ligament of left knee, initial encounter S83.242D (ICD-10-CM) - Other tear of medial meniscus, current injury, left knee, subsequent encounter  THERAPY DIAG:  Muscle weakness (generalized)  Chronic pain of left knee  Stiffness of left knee, not elsewhere classified  Other abnormalities of gait and mobility  Localized edema  Rationale for Evaluation and Treatment: Rehabilitation  ONSET DATE: 09/13/2023 s/p Lt ACL with medial meniscus  SUBJECTIVE:   SUBJECTIVE STATEMENT: [place] reports no problems from her last appointment.  Tylenol 800 mg as needed.  PERTINENT HISTORY: 3/18 surgery with Left LEFT KNEE ACL TEAR RECONSTRUCTION , BONE MARROW ASPIRATE CONCENTRATE, Left MEDIAL MENISCAL TEAR REPAIR, Left BONE -PATELLA-TENDON-BONE ALLOGRAFT Chronic LBP, HLD, GERD, ADHD  No hx of DVT- ok to do BFR  PAIN:  NPRS scale: 0-4/10 this week Pain location: Lt knee Pain description: burning, numbness Aggravating factors: too much WB, prolonged postures Relieving factors: ice, rest, elevation, pain meds- ibprophin, roboxin at bed time  PRECAUTIONS: Other:  ROM & strengthening closed  chain; ok to WBAT in 7 days  WEIGHT BEARING RESTRICTIONS: "ok to WBAT in 7 days" per referral 09/28/2023. However will need clarification.   FALLS:  Has patient fallen in last 6 months? 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 No displaced fragment. Lateral meniscus:  Intact.   LIGAMENTS Cruciates:  The ACL is chronically torn.  The PCL is intact. Collaterals:  Intact.  CARTILAGE Patellofemoral:  Preserved. Medial:  Minimally degenerated. Lateral:  Minimally degenerated. Joint:  Small to moderate joint effusion. Popliteal Fossa:  No Baker's cyst. Extensor Mechanism:  Intact.   Bones: No fracture, stress change or worrisome lesion. Small enchondroma distal femur noted   Other: Subcutaneous edema anterior to the patella and patellar tendon compatible bursitis is seen.   IMPRESSION: 1. Longitudinal tear posterior horn and posterior body of the medial meniscus without displaced fragment. 2. Chronic ACL tear. 3. Mild medial and lateral compartment osteoarthritis. 4. Prepatellar bursitis.    PATIENT SURVEYS:  Patient-Specific Activity Scoring Scheme  "0" represents "unable to perform." "10" represents "able to perform at prior level. 0 1 2 3 4 5 6 7 8 9  10 (Date and Score)   Activity Eval 09/22/23    1. Full AROM of knee 1     2. Improved knee stability  1    3. Return to jujitsu 1   4. stairs 1   5. walking 1   Score 1/10    Total score = sum of the activity scores/number of activities Minimum detectable change (90%CI) for average score = 2 points Minimum detectable change (90%CI) for single activity score = 3 points  COGNITION: Evaluation on 09/22/2023:   Overall cognitive status: WFL    SENSATION: Evaluation on 09/22/2023:   [facility]  EDEMA:  Evaluation on 09/22/2023:  Not measured but left knee is larger by  observation.  She reports increased with activities.   POSTURE:  Evaluation on 09/22/2023:  No Significant postural limitations  PALPATION: Evaluation on 09/22/2023:  Tender to surgical sites and quadricep  LOWER EXTREMITY ROM:   ROM Left Eval 09/22/2023 Left 09/30/2023  Hip flexion    Hip extension    Hip abduction    Hip adduction    Hip internal rotation    Hip external rotation    Knee flexion Supine with strap AA: 84 Active at 101  Knee extension Supine quad set A: -7 Active -1  Ankle dorsiflexion    Ankle plantarflexion    Ankle inversion    Ankle eversion     (Blank rows = not tested)  LOWER EXTREMITY MMT:  Lt not tested at eval due to surgery  MMT Right Eval 09/22/23 Left Eval 09/22/23  Hip flexion 5 4  Hip extension    Hip abduction    Hip adduction    Hip internal rotation    Hip external rotation    Knee flexion 5 3-/5  Knee extension 5 3-/5  Ankle dorsiflexion 5  5  Ankle plantarflexion 5 as seen in gait   Ankle inversion    Ankle eversion     (Blank rows = not tested)  FUNCTIONAL TESTS:  Evaluation on 09/22/2023: Rises from 18in chair with BUE use on armrests and LLE slightly in front to decrease knee bending  GAIT: Evaluation on 09/22/2023: Distance walked: clinical Assistive device utilized: Crutches Level of assistance: SBA Comments: crutches too short, PWB on LLE using 3pt gait, decreased cadence, decreased step length                                                                                                                                                                        TODAY'S TREATMENT                                                                          DATE:  09/30/2023 Seated straight leg raises 3 sets of 5 for 3 seconds with 1# Quad sets with BFR 168 mm Hg 30/15/15/15 reps with 1 minute rest after 30 and 30 second rest after 15, seated for all 3 sets Prone alternating hip extensions 2 sets of 10 for 3 seconds Reviewed  HEP with Larita Fife  Neuromuscular re-education: Tandem balance with most weight on the uninvolved right side 2 x 20 seconds each  Vaso Left knee medium Pressure 34*   09/29/2023 Seated straight leg raises 2 sets of 5 for 3 seconds Quad sets with BFR 168 mm Hg 30/15/15/15 reps with 1 minute rest, 1st set supine bilateral, last 3 sets seated Reviewed HEP with Larita Fife  Neuromuscular re-education: Tandem balance with most weight on the uninvolved right side 2 x 20 seconds each  Vaso Left knee medium Pressure 34*   09/27/2023 Therapeutic Exercise: Blood Flow Restriction Therapy  seated occulusion 210 work at 80% (168) LLE LAQ protocol 30 reps-15-15-15 with 30 second rest  Standing LLE TKE green theraband 5 sec hold 15 reps 2 sets BUE crutch support  Therapeutic Activities:  Leg press BLEs 75# 15 reps 2 sets Pt amb with axillary crutches LLE PWB step through pattern no nonverbal signs of pain.   Pt neg curb with axillary crutches LLE PWB safely.  Pt questioning sending CPM back as paying OOP weekly.  Pt said okay to return CPM as long as does knee flexion range exercises consistently.  Pt verbalized understanding.   Vaso left knee with elevation 34* high compression 10 min   PATIENT EDUCATION:  Education  details: HEP, POC Person educated: Patient Education method: Explanation, Demonstration, Verbal cues, and Handouts Education comprehension: verbalized understanding, returned demonstration, and verbal cues required  HOME EXERCISE PROGRAM: Access Code: [email or web address] URL: [email or web address] Date: 09/30/2023 Prepared by: Pauletta Browns  Exercises - Ankle Alphabet in Elevation  - 2-3 x daily - 7 x weekly - 1 sets - 2-3 reps - elevate 10-15 min hold - Supine Active Straight Leg Raise (Mirrored)  - 2-3 x daily - 7 x weekly - 2 sets - 10 reps - 5 seconds hold - supine quad set with towel roll under ankle  - 2-3 x daily - 7 x weekly - 2 sets - 10 reps - 5 seconds hold - Supine  Heel Slide with Strap  - 2-3 x daily - 7 x weekly - 2 sets - 10 reps - 5 seconds hold - seated single straight leg raise   - 2-3 x daily - 7 x weekly - 2 sets - 10 reps - 5 seconds hold - Seated Quad Set  - 2-3 x daily - 7 x weekly - 2 sets - 10 reps - 5 seconds hold - Seated Knee Flexion Extension AROM   - 2-3 x daily - 7 x weekly - 2 sets - 10 reps - 5 seconds hold - Prone Hip Extension  - 1 x daily - 7 x weekly - 2 sets - 10 reps - 3 seconds hold  ASSESSMENT:  CLINICAL IMPRESSION: Shelitha continues to do an excellent job < 3 weeks post-surgery.  Quadriceps strength and edema control remain high priorities, although we added 1 hamstrings strengthening activity today.  Continue to appropriately progress current quadriceps and hamstrings strength activities and to protect the healing reconstruction.  Continue her current plan of care.  OK to increase WBAT 10/05/2023.  OBJECTIVE IMPAIRMENTS: Abnormal gait, decreased activity tolerance, decreased balance, decreased coordination, decreased endurance, decreased knowledge of condition, decreased knowledge of use of DME, decreased mobility, difficulty walking, decreased ROM, decreased strength, increased edema, increased muscle spasms, and pain.   ACTIVITY LIMITATIONS: carrying, lifting, bending, sitting, standing, squatting, stairs, and locomotion level  PARTICIPATION LIMITATIONS: meal prep, cleaning, laundry, driving, shopping, community activity, and occupation  PERSONAL FACTORS: Profession, Time since onset of injury/illness/exacerbation, and 1-2 comorbidities: see above  are also affecting patient's functional outcome.   REHAB POTENTIAL: Good  CLINICAL DECISION MAKING: Stable/uncomplicated  EVALUATION COMPLEXITY: Low   GOALS: Goals reviewed with patient? Yes  SHORT TERM GOALS: (target date for Short term goals are 3 weeks 10/13/2023)   1.  Patient will demonstrate independent use of home exercise program to maintain progress from in clinic  treatments.  Goal status: Ongoing  09/30/2023  2.  Patient will demonstrate AROM 0-95deg   Goal status: Partially Met 09/30/2023   LONG TERM GOALS: (target dates for all long term goals are 10 weeks  12/01/2023 )   1. Patient will demonstrate/report pain at worst less than or equal to 2/10 to facilitate minimal limitation in daily activity secondary to pain symptoms.  Goal status: Partially Met 09/29/2023   2. Patient will demonstrate independent use of home exercise program to facilitate ability to maintain/progress functional gains from skilled physical therapy services.  Goal status: Ongoing  09/30/2023   3. Patient will demonstrate Patient specific functional scale avg > or = 7 to indicate reduced disability due to condition.   Goal status: Ongoing  09/27/2023   4.  Patient will demonstrate LLE MMT 5/5 throughout to faciltiate usual transfers, stairs, squatting at [facility]  for daily life.   Goal status: Ongoing  09/27/2023   5.  Patient will demonstrate/report ability to perform 18 inch chair transfer without UE assist.   Goal status: Ongoing  09/27/2023   6.  Patient will demonstrate up and down a flight of stairs with single hand rail with reciprocal gait pattern.  Goal status: Ongoing  09/27/2023   7.  patient will demonstrate AROM 0-120 for full range of motion necessary to perform job tasks Goal Status: Ongoing  09/27/2023   PLAN:  PT FREQUENCY: 3x/wk for 2 weeks, then 1-2x/week  PT DURATION: 10 weeks  PLANNED INTERVENTIONS: Can include 40981- PT Re-evaluation, 97110-Therapeutic exercises, 97530- Therapeutic activity, 97112- Neuromuscular re-education, 97535- Self Care, 97140- Manual therapy, [phone]- Gait training, [phone]- Orthotic Fit/training, [phone]- Canalith repositioning, [id number]- Aquatic Therapy, [phone]- Electrical stimulation (unattended), [phone]- Electrical stimulation (manual), [id number] Physical performance testing, 97016- Vasopneumatic device, [id number]- Ultrasound, [id number]- Traction  (mechanical), [id number]- Ionotophoresis 4mg /ml Dexamethasone, Blood Flow Restriction Therapy,  Patient/Family education, Balance training, Stair training, Taping, Dry Needling, Joint mobilization, Joint manipulation, Spinal manipulation, Spinal mobilization, Scar mobilization, Vestibular training, Visual/preceptual remediation/compensation, DME instructions, Cryotherapy, and Moist heat.  All performed as medically necessary.  All included unless contraindicated  PLAN FOR NEXT SESSION: Use BFR protocol with quad sets, seated SLR or LAQ (release BFR between doing another exercise), quadriceps and appropriate hamstrings strengthening, vaso as needed.  Cherlyn Cushing, PT, MPT 09/30/2023, 1:45 PM

## 2023-10-03 ENCOUNTER — Ambulatory Visit: Admitting: Physical Therapy

## 2023-10-03 ENCOUNTER — Encounter: Payer: Self-pay | Admitting: Physical Therapy

## 2023-10-03 DIAGNOSIS — R2689 Other abnormalities of gait and mobility: Secondary | ICD-10-CM

## 2023-10-03 DIAGNOSIS — M25662 Stiffness of left knee, not elsewhere classified: Secondary | ICD-10-CM

## 2023-10-03 DIAGNOSIS — M25562 Pain in left knee: Secondary | ICD-10-CM | POA: Diagnosis not present

## 2023-10-03 DIAGNOSIS — M6281 Muscle weakness (generalized): Secondary | ICD-10-CM | POA: Diagnosis not present

## 2023-10-03 DIAGNOSIS — R600 Localized edema: Secondary | ICD-10-CM | POA: Diagnosis not present

## 2023-10-03 DIAGNOSIS — G8929 Other chronic pain: Secondary | ICD-10-CM

## 2023-10-03 NOTE — Therapy (Signed)
 OUTPATIENT PHYSICAL THERAPY LOWER EXTREMITY TREATMENT   Patient Name: Jasmine Ortega MRN: [id number] DOB:[birth year], 43 y.o., female Today's Date: 10/03/2023  END OF SESSION:  PT End of Session - 10/03/23 0803     Visit Number 5    Number of Visits 20    Date for PT Re-Evaluation 12/01/23    Authorization Type [facility] [employer]    Progress Note Due on Visit 10    PT Start Time 0803    PT Stop Time 0855    PT Time Calculation (min) 52 min    Activity Tolerance Patient tolerated treatment well;No increased pain    Behavior During Therapy WFL for tasks assessed/performed                Past Medical History:  Diagnosis Date   Birth control    Chicken pox    Depression    GERD (gastroesophageal reflux disease)    Gestational diabetes    Past Surgical History:  Procedure Laterality Date   ALLOGRAFT APPLICATION Left 09/13/2023   Procedure: BONE -PATELLA-TENDON-BONE ALLOGRAFT;  Surgeon: Cammy Copa, MD;  Location: [facility] OR;  Service: Orthopedics;  Laterality: Left;   ANTERIOR CRUCIATE LIGAMENT REPAIR Left 09/13/2023   Procedure: LEFT KNEE ACL TEAR RECONSTRUCTION;  Surgeon: Cammy Copa, MD;  Location: [facility] OR;  Service: Orthopedics;  Laterality: Left;   BONE MARROW ASPIRATION FOR SPINE FUSION ONLY (BMAC) Left 09/13/2023   Procedure: BONE MARROW ASPIRATE CONCENTRATE FROM ILLIAC CREST;  Surgeon: Cammy Copa, MD;  Location: [facility] OR;  Service: Orthopedics;  Laterality: Left;   KNEE ARTHROSCOPY WITH MENISCAL REPAIR Left 09/13/2023   Procedure: MEDIAL MENISCAL TEAR REPAIR;  Surgeon: Cammy Copa, MD;  Location: [facility] OR;  Service: Orthopedics;  Laterality: Left;   LASIK Bilateral    NO PAST SURGERIES     Patient Active Problem List   Diagnosis Date Noted   Left anterior cruciate ligament tear 09/18/2023   Acute medial meniscus tear of left knee 09/18/2023   Pain in left knee 08/03/2023   Chronic low back pain without sciatica 09/23/2021   Dysthymia 09/23/2021    Obesity (BMI 30-39.9) 09/14/2019   Hyperlipidemia 09/14/2019   Gastroesophageal reflux disease 04/27/2017   Attention deficit hyperactivity disorder (ADHD), predominantly inattentive type 03/05/2015   Loss of transverse plantar arch of right foot 09/18/2014    PCP: Ronnald Nian, MD  REFERRING PROVIDER: Cammy Copa, MD  REFERRING DIAG:  [phone] (ICD-10-CM) - Rupture of anterior cruciate ligament of left knee, initial encounter S83.242D (ICD-10-CM) - Other tear of medial meniscus, current injury, left knee, subsequent encounter  THERAPY DIAG:  Muscle weakness (generalized)  Chronic pain of left knee  Stiffness of left knee, not elsewhere classified  Other abnormalities of gait and mobility  Localized edema  Rationale for Evaluation and Treatment: Rehabilitation  ONSET DATE: 09/13/2023 s/p Lt ACL with medial meniscus  SUBJECTIVE:   SUBJECTIVE STATEMENT: Jasmine Ortega reports 1/10 knee pain overall today  PERTINENT HISTORY: 3/18 surgery with Left LEFT KNEE ACL TEAR RECONSTRUCTION , BONE MARROW ASPIRATE CONCENTRATE, Left MEDIAL MENISCAL TEAR REPAIR, Left BONE -PATELLA-TENDON-BONE ALLOGRAFT Chronic LBP, HLD, GERD, ADHD  No hx of DVT- ok to do BFR  PAIN:  NPRS scale: 1/10 today Pain location: Lt knee Pain description: burning, numbness Aggravating factors: too much WB, prolonged postures Relieving factors: ice, rest, elevation, pain meds- ibprophin, roboxin at bed time  PRECAUTIONS: Other:  ROM & strengthening closed chain; ok to WBAT in 7 days  WEIGHT BEARING RESTRICTIONS: "ok to WBAT in 7 days" per referral 09/28/2023. However will need clarification.   FALLS:  Has patient fallen in last 6 months? 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 No displaced fragment. Lateral meniscus:  Intact.   LIGAMENTS Cruciates:  The ACL is chronically torn.  The PCL is intact. Collaterals:  Intact.  CARTILAGE Patellofemoral:  Preserved. Medial:  Minimally degenerated. Lateral:  Minimally degenerated. Joint:  Small to moderate joint effusion. Popliteal Fossa:  No Baker's cyst. Extensor Mechanism:  Intact.   Bones: No fracture, stress change or worrisome lesion. Small enchondroma distal femur noted   Other: Subcutaneous edema anterior to the patella and patellar tendon compatible bursitis is seen.   IMPRESSION: 1. Longitudinal tear posterior horn and posterior body of the medial meniscus without displaced fragment. 2. Chronic ACL tear. 3. Mild medial and lateral compartment osteoarthritis. 4. Prepatellar bursitis.    PATIENT SURVEYS:  Patient-Specific Activity Scoring Scheme  "0" represents "unable to perform." "10" represents "able to perform at prior level. 0 1 2 3 4 5 6 7 8 9  10 (Date and Score)   Activity Eval 09/22/23    1. Full AROM of knee 1     2. Improved knee stability  1    3. Return to jujitsu 1   4. stairs 1   5. walking 1   Score 1/10    Total score = sum of the activity scores/number of activities Minimum detectable change (90%CI) for average score = 2 points Minimum detectable change (90%CI) for single activity score = 3 points  COGNITION: Evaluation on 09/22/2023:   Overall cognitive status: WFL    SENSATION: Evaluation on 09/22/2023:   [facility]  EDEMA:  Evaluation on 09/22/2023:  Not measured but left knee is larger by observation.  She reports increased with activities.   POSTURE:   Evaluation on 09/22/2023:  No Significant postural limitations  PALPATION: Evaluation on 09/22/2023:  Tender to surgical sites and quadricep  LOWER EXTREMITY ROM:   ROM Left Eval 09/22/2023 Left 09/30/2023  Hip flexion    Hip extension    Hip abduction    Hip adduction    Hip internal rotation    Hip external rotation    Knee flexion Supine with strap AA: 84 Active at 101  Knee extension Supine quad set A: -7 Active -1  Ankle dorsiflexion    Ankle plantarflexion    Ankle inversion    Ankle eversion     (Blank rows = not tested)  LOWER EXTREMITY MMT:  Lt not tested at eval due to surgery  MMT Right Eval 09/22/23 Left Eval 09/22/23  Hip flexion 5 4  Hip extension    Hip abduction    Hip adduction    Hip internal rotation    Hip external rotation    Knee flexion 5 3-/5  Knee extension 5 3-/5  Ankle dorsiflexion 5 5  Ankle plantarflexion 5 as seen in  gait   Ankle inversion    Ankle eversion     (Blank rows = not tested)  FUNCTIONAL TESTS:  Evaluation on 09/22/2023: Rises from 18in chair with BUE use on armrests and LLE slightly in front to decrease knee bending  GAIT: Evaluation on 09/22/2023: Distance walked: clinical Assistive device utilized: Crutches Level of assistance: SBA Comments: crutches too short, PWB on LLE using 3pt gait, decreased cadence, decreased step length                                                                                                                                                                        TODAY'S TREATMENT                                                                          DATE:  10/03/2023 Sci fit bike L1, X 8 minutes total, seat #8, rocked for 1 minute then full revolutions backwards for next 7 min Seated straight leg raises  with BFR 168 mm Hg, reps of 20, 15, 15, 15 with 30 sec rest in between. Standing TKE green band with  BFR 168 mm Hg, reps of 30, 15, 15, 15 with 30 sec rest in between. Heel slides  AAROM knee flexion stretch with strap 5 sec hold X10   Vaso Left knee medium Pressure 34* X 10 min  09/30/2023 Seated straight leg raises 3 sets of 5 for 3 seconds with 1# Quad sets with BFR 168 mm Hg 30/15/15/15 reps with 1 minute rest after 30 and 30 second rest after 15, seated for all 3 sets Prone alternating hip extensions 2 sets of 10 for 3 seconds Reviewed HEP with Larita Fife  Neuromuscular re-education: Tandem balance with most weight on the uninvolved right side 2 x 20 seconds each  Vaso Left knee medium Pressure 34*   09/29/2023 Seated straight leg raises 2 sets of 5 for 3 seconds Quad sets with BFR 168 mm Hg 30/15/15/15 reps with 1 minute rest, 1st set supine bilateral, last 3 sets seated Reviewed HEP with Larita Fife  Neuromuscular re-education: Tandem balance with most weight on the uninvolved right side 2 x 20 seconds each  Vaso Left knee medium Pressure 34*   09/27/2023 Therapeutic Exercise: Blood Flow Restriction Therapy  seated occulusion 210 work at 80% (168) LLE LAQ protocol 30 reps-15-15-15 with 30 second rest  Standing LLE TKE green theraband 5 sec hold 15 reps 2 sets BUE crutch support  Therapeutic Activities:  Leg press BLEs 75# 15 reps 2 sets Pt amb with axillary crutches LLE PWB step through pattern no nonverbal signs of pain.   Pt neg curb with axillary crutches LLE PWB safely.  Pt questioning sending CPM back as paying OOP weekly.  Pt said okay to return CPM as long as does knee flexion range exercises consistently.  Pt verbalized understanding.   Vaso left knee with elevation 34* high compression 10 min   PATIENT EDUCATION:  Education details: HEP, POC Person educated: Patient Education method: Explanation, Demonstration, Verbal cues, and Handouts Education comprehension: verbalized understanding, returned demonstration, and verbal cues required  HOME EXERCISE PROGRAM: Access Code: [email or web address] URL: [email or web address] Date:  09/30/2023 Prepared by: Pauletta Browns  Exercises - Ankle Alphabet in Elevation  - 2-3 x daily - 7 x weekly - 1 sets - 2-3 reps - elevate 10-15 min hold - Supine Active Straight Leg Raise (Mirrored)  - 2-3 x daily - 7 x weekly - 2 sets - 10 reps - 5 seconds hold - supine quad set with towel roll under ankle  - 2-3 x daily - 7 x weekly - 2 sets - 10 reps - 5 seconds hold - Supine Heel Slide with Strap  - 2-3 x daily - 7 x weekly - 2 sets - 10 reps - 5 seconds hold - seated single straight leg raise   - 2-3 x daily - 7 x weekly - 2 sets - 10 reps - 5 seconds hold - Seated Quad Set  - 2-3 x daily - 7 x weekly - 2 sets - 10 reps - 5 seconds hold - Seated Knee Flexion Extension AROM   - 2-3 x daily - 7 x weekly - 2 sets - 10 reps - 5 seconds hold - Prone Hip Extension  - 1 x daily - 7 x weekly - 2 sets - 10 reps - 3 seconds hold  ASSESSMENT:  CLINICAL IMPRESSION: She showed good tolerance to exercise progressions today, working on BFR to improve quad strength. ROM also improving as expected. Continue with current PT plan.  OBJECTIVE IMPAIRMENTS: Abnormal gait, decreased activity tolerance, decreased balance, decreased coordination, decreased endurance, decreased knowledge of condition, decreased knowledge of use of DME, decreased mobility, difficulty walking, decreased ROM, decreased strength, increased edema, increased muscle spasms, and pain.   ACTIVITY LIMITATIONS: carrying, lifting, bending, sitting, standing, squatting, stairs, and locomotion level  PARTICIPATION LIMITATIONS: meal prep, cleaning, laundry, driving, shopping, community activity, and occupation  PERSONAL FACTORS: Profession, Time since onset of injury/illness/exacerbation, and 1-2 comorbidities: see above  are also affecting patient's functional outcome.   REHAB POTENTIAL: Good  CLINICAL DECISION MAKING: Stable/uncomplicated  EVALUATION COMPLEXITY: Low   GOALS: Goals reviewed with patient? Yes  SHORT TERM GOALS:  (target date for Short term goals are 3 weeks 10/13/2023)   1.  Patient will demonstrate independent use of home exercise program to maintain progress from in clinic treatments.  Goal status: Ongoing  09/30/2023  2.  Patient will demonstrate AROM 0-95deg   Goal status: Partially Met 09/30/2023   LONG TERM GOALS: (target dates for all long term goals are 10 weeks  12/01/2023 )   1. Patient will demonstrate/report pain at worst less than or equal to 2/10 to facilitate minimal limitation in daily activity secondary to pain symptoms.  Goal status: Partially Met 09/29/2023   2. Patient will demonstrate independent use of home exercise program to facilitate ability to maintain/progress functional gains from skilled physical therapy services.  Goal status: Ongoing  09/30/2023   3. Patient will demonstrate Patient specific functional scale avg > or = 7 to indicate reduced disability due to condition.   Goal status: Ongoing  09/27/2023   4.  Patient will demonstrate LLE MMT 5/5 throughout to faciltiate usual transfers, stairs, squatting at [facility] for daily life.   Goal status: Ongoing  09/27/2023   5.  Patient will demonstrate/report ability to perform 18 inch chair transfer without UE assist.   Goal status: Ongoing  09/27/2023   6.  Patient will demonstrate up and down a flight of stairs with single hand rail with reciprocal gait pattern.  Goal status: Ongoing  09/27/2023   7.  patient will demonstrate AROM 0-120 for full range of motion necessary to perform job tasks Goal Status: Ongoing  09/27/2023   PLAN:  PT FREQUENCY: 3x/wk for 2 weeks, then 1-2x/week  PT DURATION: 10 weeks  PLANNED INTERVENTIONS: Can include 16109- PT Re-evaluation, 97110-Therapeutic exercises, 97530- Therapeutic activity, 97112- Neuromuscular re-education, 97535- Self Care, 97140- Manual therapy, [phone]- Gait training, [phone]- Orthotic Fit/training, [phone]- Canalith repositioning, [id number]- Aquatic Therapy, [phone]- Electrical  stimulation (unattended), [phone]- Electrical stimulation (manual), [id number] Physical performance testing, 97016- Vasopneumatic device, [id number]- Ultrasound, [id number]- Traction (mechanical), [id number]- Ionotophoresis 4mg /ml Dexamethasone, Blood Flow Restriction Therapy,  Patient/Family education, Balance training, Stair training, Taping, Dry Needling, Joint mobilization, Joint manipulation, Spinal manipulation, Spinal mobilization, Scar mobilization, Vestibular training, Visual/preceptual remediation/compensation, DME instructions, Cryotherapy, and Moist heat.  All performed as medically necessary.  All included unless contraindicated  PLAN FOR NEXT SESSION: Use BFR (release BFR between doing another exercise), quadriceps and appropriate hamstrings/hip strengthening, vaso as needed.  April Manson, PT, DPT 10/03/2023, 8:51 AM

## 2023-10-04 ENCOUNTER — Other Ambulatory Visit (HOSPITAL_COMMUNITY): Payer: Self-pay

## 2023-10-04 ENCOUNTER — Other Ambulatory Visit: Payer: Self-pay | Admitting: Family Medicine

## 2023-10-04 DIAGNOSIS — F900 Attention-deficit hyperactivity disorder, predominantly inattentive type: Secondary | ICD-10-CM

## 2023-10-04 MED ORDER — AMPHETAMINE-DEXTROAMPHET ER 15 MG PO CP24
15.0000 mg | ORAL_CAPSULE | ORAL | 0 refills | Status: DC
  Filled 2023-10-04: qty 90, 90d supply, fill #0

## 2023-10-04 MED ORDER — AMPHETAMINE-DEXTROAMPHET ER 5 MG PO CP24
5.0000 mg | ORAL_CAPSULE | Freq: Every day | ORAL | 0 refills | Status: DC
  Filled 2023-10-04: qty 90, 90d supply, fill #0

## 2023-10-05 ENCOUNTER — Ambulatory Visit: Admitting: Physical Therapy

## 2023-10-05 ENCOUNTER — Encounter: Payer: Self-pay | Admitting: Physical Therapy

## 2023-10-05 DIAGNOSIS — R293 Abnormal posture: Secondary | ICD-10-CM

## 2023-10-05 DIAGNOSIS — G8929 Other chronic pain: Secondary | ICD-10-CM

## 2023-10-05 DIAGNOSIS — R2689 Other abnormalities of gait and mobility: Secondary | ICD-10-CM

## 2023-10-05 DIAGNOSIS — M25662 Stiffness of left knee, not elsewhere classified: Secondary | ICD-10-CM | POA: Diagnosis not present

## 2023-10-05 DIAGNOSIS — M25562 Pain in left knee: Secondary | ICD-10-CM | POA: Diagnosis not present

## 2023-10-05 DIAGNOSIS — M62838 Other muscle spasm: Secondary | ICD-10-CM

## 2023-10-05 DIAGNOSIS — M6281 Muscle weakness (generalized): Secondary | ICD-10-CM

## 2023-10-05 DIAGNOSIS — R600 Localized edema: Secondary | ICD-10-CM | POA: Diagnosis not present

## 2023-10-05 NOTE — Therapy (Signed)
 OUTPATIENT PHYSICAL THERAPY LOWER EXTREMITY TREATMENT   Patient Name: SANYA KOBRIN MRN: [id number] DOB:July 19, 1980, 43 y.o., female Today's Date: 10/05/2023  END OF SESSION:  PT End of Session - 10/05/23 0923     Visit Number 6    Number of Visits 20    Date for PT Re-Evaluation 12/01/23    Authorization Type [employer]    Progress Note Due on Visit 10    PT Start Time 0845    PT Stop Time 0930    PT Time Calculation (min) 45 min    Activity Tolerance Patient tolerated treatment well;No increased pain    Behavior During Therapy WFL for tasks assessed/performed                 Past Medical History:  Diagnosis Date   Birth control    Chicken pox    Depression    GERD (gastroesophageal reflux disease)    Gestational diabetes    Past Surgical History:  Procedure Laterality Date   ALLOGRAFT APPLICATION Left 09/13/2023   Procedure: BONE -PATELLA-TENDON-BONE ALLOGRAFT;  Surgeon: Cammy Copa, MD;  Location: [facility] OR;  Service: Orthopedics;  Laterality: Left;   ANTERIOR CRUCIATE LIGAMENT REPAIR Left 09/13/2023   Procedure: LEFT KNEE ACL TEAR RECONSTRUCTION;  Surgeon: Cammy Copa, MD;  Location: [facility] OR;  Service: Orthopedics;  Laterality: Left;   BONE MARROW ASPIRATION FOR SPINE FUSION ONLY (BMAC) Left 09/13/2023   Procedure: BONE MARROW ASPIRATE CONCENTRATE FROM ILLIAC CREST;  Surgeon: Cammy Copa, MD;  Location: [facility] OR;  Service: Orthopedics;  Laterality: Left;   KNEE ARTHROSCOPY WITH MENISCAL REPAIR Left 09/13/2023   Procedure: MEDIAL MENISCAL TEAR REPAIR;  Surgeon: Cammy Copa, MD;  Location: [facility] OR;  Service: Orthopedics;  Laterality: Left;   LASIK Bilateral    NO PAST SURGERIES     Patient Active Problem List   Diagnosis Date Noted   Left anterior cruciate ligament tear 09/18/2023   Acute medial meniscus tear of left knee 09/18/2023   Pain in left knee 08/03/2023   Chronic low back pain without sciatica 09/23/2021   Dysthymia 09/23/2021    Obesity (BMI 30-39.9) 09/14/2019   Hyperlipidemia 09/14/2019   Gastroesophageal reflux disease 04/27/2017   Attention deficit hyperactivity disorder (ADHD), predominantly inattentive type 03/05/2015   Loss of transverse plantar arch of right foot 09/18/2014    PCP: Ronnald Nian, MD  REFERRING PROVIDER: Cammy Copa, MD  REFERRING DIAG:  [id number] (ICD-10-CM) - Rupture of anterior cruciate ligament of left knee, initial encounter S83.242D (ICD-10-CM) - Other tear of medial meniscus, current injury, left knee, subsequent encounter  THERAPY DIAG:  Muscle weakness (generalized)  Chronic pain of left knee  Stiffness of left knee, not elsewhere classified  Other abnormalities of gait and mobility  Localized edema  Abnormal posture  Other muscle spasm  Rationale for Evaluation and Treatment: Rehabilitation  ONSET DATE: 09/13/2023 s/p Lt ACL with medial meniscus  SUBJECTIVE:   SUBJECTIVE STATEMENT: Leyani arriving to therapy reporting 4/10 pain in her left knee.   PERTINENT HISTORY: 3/18 surgery with Left LEFT KNEE ACL TEAR RECONSTRUCTION , BONE MARROW ASPIRATE CONCENTRATE, Left MEDIAL MENISCAL TEAR REPAIR, Left BONE -PATELLA-TENDON-BONE ALLOGRAFT Chronic LBP, HLD, GERD, ADHD  No hx of DVT- ok to do BFR  PAIN:  NPRS scale: 4/10 today after walking a longer distance yesterday at [facility]  Pain location: Lt knee Pain description: burning, numbness Aggravating factors: too much WB, prolonged postures Relieving factors: ice, rest,  elevation, pain meds- ibprophin, roboxin at bed time  PRECAUTIONS: Other:  ROM & strengthening closed chain; ok to WBAT in 7 days  WEIGHT BEARING RESTRICTIONS: "ok to WBAT in 7 days" per referral 09/28/2023. However will need clarification.   FALLS:  Has patient fallen in last 6 months? 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 No displaced fragment. Lateral meniscus:  Intact.   LIGAMENTS Cruciates:  The ACL is chronically torn.  The PCL is intact. Collaterals:  Intact.  CARTILAGE Patellofemoral:  Preserved. Medial:  Minimally degenerated. Lateral:  Minimally degenerated. Joint:  Small to moderate joint effusion. Popliteal Fossa:  No Baker's cyst. Extensor Mechanism:  Intact.   Bones: No fracture, stress change or worrisome lesion. Small enchondroma distal femur noted   Other: Subcutaneous edema anterior to the patella and patellar tendon compatible bursitis is seen.   IMPRESSION: 1. Longitudinal tear posterior horn and posterior body of the medial meniscus without displaced fragment. 2. Chronic ACL tear. 3. Mild medial and lateral compartment osteoarthritis. 4. Prepatellar bursitis.    PATIENT SURVEYS:  Patient-Specific Activity Scoring Scheme  "0" represents "unable to perform." "10" represents "able to perform at prior level. 0 1 2 3 4 5 6 7 8 9  10 (Date and Score)   Activity Eval 09/22/23    1. Full AROM of knee 1     2. Improved knee stability  1    3. Return to jujitsu 1   4. stairs 1   5. walking 1   Score 1/10    Total score = sum of the activity scores/number of activities Minimum detectable change (90%CI) for average score = 2 points Minimum detectable change (90%CI) for single activity score = 3 points  COGNITION: Evaluation on 09/22/2023:   Overall cognitive status: WFL    SENSATION: Evaluation on 09/22/2023:    [facility]  EDEMA:  Evaluation on 09/22/2023:  Not measured but left knee is larger by observation.  She reports increased with activities.   POSTURE:  Evaluation on 09/22/2023:  No Significant postural limitations  PALPATION: Evaluation on 09/22/2023:  Tender to surgical sites and quadricep  LOWER EXTREMITY ROM:   ROM Left Eval 09/22/2023 Left 09/30/2023  Hip flexion    Hip extension    Hip abduction    Hip adduction    Hip internal rotation    Hip external rotation    Knee flexion Supine with strap AA: 84 Active at 101  Knee extension Supine quad set A: -7 Active -1  Ankle dorsiflexion    Ankle plantarflexion    Ankle inversion    Ankle eversion     (Blank rows = not tested)  LOWER EXTREMITY MMT:  Lt not tested at eval due to surgery  MMT Right Eval 09/22/23 Left Eval 09/22/23  Hip flexion 5 4  Hip extension    Hip abduction    Hip adduction    Hip internal rotation    Hip external rotation  Knee flexion 5 3-/5  Knee extension 5 3-/5  Ankle dorsiflexion 5 5  Ankle plantarflexion 5 as seen in gait   Ankle inversion    Ankle eversion     (Blank rows = not tested)  FUNCTIONAL TESTS:  Evaluation on 09/22/2023: Rises from 18in chair with BUE use on armrests and LLE slightly in front to decrease knee bending  GAIT: Evaluation on 09/22/2023: Distance walked: clinical Assistive device utilized: Crutches Level of assistance: SBA Comments: crutches too short, PWB on LLE using 3pt gait, decreased cadence, decreased step length                                                                                                                                                                        TODAY'S TREATMENT                                                                          DATE:  10/05/23:  TherEx: Sci fit bike L3, X 8 minutes total, seat  beginning on #10 and progressing to #8 Seated straight leg raises  with BFR 168 mm Hg, reps of 20, 15, 15, 15 with 30 sec rest  in between. Sit to stand: BFR 168 mm Hg, reps of x 15, 10, 10 with 30 sec rest in between. Heel slides AAROM knee flexion stretch with strap 5 sec hold X10  Gait: Walking with single crutch 200 feet instructions in 3 point gait pattern and heel to toe on left LE Walking with no device 200 feet utilizing heel to toe gait pattern, (pt amb with slow gait velocity and caution)  Modalities:  Vaso Left knee medium Pressure 34* X 10 min     10/03/2023 Sci fit bike L1, X 8 minutes total, seat #8, rocked for 1 minute then full revolutions backwards for next 7 min Seated straight leg raises  with BFR 168 mm Hg, reps of 20, 15, 15, 15 with 30 sec rest in between. Standing TKE green band with  BFR 168 mm Hg, reps of 30, 15, 15, 15 with 30 sec rest in between. Heel slides AAROM knee flexion stretch with strap 5 sec hold X10   Vaso Left knee medium Pressure 34* X 10 min  09/30/2023 Seated straight leg raises 3 sets of 5 for 3 seconds with 1# Quad sets with BFR 168 mm Hg 30/15/15/15 reps with 1 minute rest after 30 and 30 second rest after 15, seated for all  3 sets Prone alternating hip extensions 2 sets of 10 for 3 seconds Reviewed HEP with Larita Fife  Neuromuscular re-education: Tandem balance with most weight on the uninvolved right side 2 x 20 seconds each  Vaso Left knee medium Pressure 34*   09/29/2023 Seated straight leg raises 2 sets of 5 for 3 seconds Quad sets with BFR 168 mm Hg 30/15/15/15 reps with 1 minute rest, 1st set supine bilateral, last 3 sets seated Reviewed HEP with Larita Fife  Neuromuscular re-education: Tandem balance with most weight on the uninvolved right side 2 x 20 seconds each  Vaso Left knee medium Pressure 34*   09/27/2023 Therapeutic Exercise: Blood Flow Restriction Therapy  seated occulusion 210 work at 80% (168) LLE LAQ protocol 30 reps-15-15-15 with 30 second rest  Standing LLE TKE green theraband 5 sec hold 15 reps 2 sets BUE crutch support  Therapeutic  Activities:  Leg press BLEs 75# 15 reps 2 sets Pt amb with axillary crutches LLE PWB step through pattern no nonverbal signs of pain.   Pt neg curb with axillary crutches LLE PWB safely.  Pt questioning sending CPM back as paying OOP weekly.  Pt said okay to return CPM as long as does knee flexion range exercises consistently.  Pt verbalized understanding.   Vaso left knee with elevation 34* high compression 10 min   PATIENT EDUCATION:  Education details: HEP, POC Person educated: Patient Education method: Explanation, Demonstration, Verbal cues, and Handouts Education comprehension: verbalized understanding, returned demonstration, and verbal cues required  HOME EXERCISE PROGRAM: Access Code: [email or web address] URL: [email or web address] Date: 09/30/2023 Prepared by: Pauletta Browns  Exercises - Ankle Alphabet in Elevation  - 2-3 x daily - 7 x weekly - 1 sets - 2-3 reps - elevate 10-15 min hold - Supine Active Straight Leg Raise (Mirrored)  - 2-3 x daily - 7 x weekly - 2 sets - 10 reps - 5 seconds hold - supine quad set with towel roll under ankle  - 2-3 x daily - 7 x weekly - 2 sets - 10 reps - 5 seconds hold - Supine Heel Slide with Strap  - 2-3 x daily - 7 x weekly - 2 sets - 10 reps - 5 seconds hold - seated single straight leg raise   - 2-3 x daily - 7 x weekly - 2 sets - 10 reps - 5 seconds hold - Seated Quad Set  - 2-3 x daily - 7 x weekly - 2 sets - 10 reps - 5 seconds hold - Seated Knee Flexion Extension AROM   - 2-3 x daily - 7 x weekly - 2 sets - 10 reps - 5 seconds hold - Prone Hip Extension  - 1 x daily - 7 x weekly - 2 sets - 10 reps - 3 seconds hold  ASSESSMENT:  CLINICAL IMPRESSION: Pt reporting soreness after walking at [facility] yesterday. Progressing to single crutch on community settings and at work and no device on level surfaces at home. Pt instructed in heel to toe gait pattern to encourage TKE during gait. Pt tolerating BFR well this visit. Pt  Continue with current PT plan.  OBJECTIVE IMPAIRMENTS: Abnormal gait, decreased activity tolerance, decreased balance, decreased coordination, decreased endurance, decreased knowledge of condition, decreased knowledge of use of DME, decreased mobility, difficulty walking, decreased ROM, decreased strength, increased edema, increased muscle spasms, and pain.   ACTIVITY LIMITATIONS: carrying, lifting, bending, sitting, standing, squatting, stairs, and locomotion level  PARTICIPATION LIMITATIONS: meal prep, cleaning, laundry, driving, shopping, community  activity, and occupation  PERSONAL FACTORS: Profession, Time since onset of injury/illness/exacerbation, and 1-2 comorbidities: see above  are also affecting patient's functional outcome.   REHAB POTENTIAL: Good  CLINICAL DECISION MAKING: Stable/uncomplicated  EVALUATION COMPLEXITY: Low   GOALS: Goals reviewed with patient? Yes  SHORT TERM GOALS: (target date for Short term goals are 3 weeks 10/13/2023)   1.  Patient will demonstrate independent use of home exercise program to maintain progress from in clinic treatments.  Goal status: Ongoing  09/30/2023  2.  Patient will demonstrate AROM 0-95deg   Goal status: Partially Met 09/30/2023   LONG TERM GOALS: (target dates for all long term goals are 10 weeks  12/01/2023 )   1. Patient will demonstrate/report pain at worst less than or equal to 2/10 to facilitate minimal limitation in daily activity secondary to pain symptoms.  Goal status: Partially Met 09/29/2023   2. Patient will demonstrate independent use of home exercise program to facilitate ability to maintain/progress functional gains from skilled physical therapy services.  Goal status: Ongoing  09/30/2023   3. Patient will demonstrate Patient specific functional scale avg > or = 7 to indicate reduced disability due to condition.   Goal status: Ongoing  09/27/2023   4.  Patient will demonstrate LLE MMT 5/5 throughout to faciltiate  usual transfers, stairs, squatting at [facility] for daily life.   Goal status: Ongoing  09/27/2023   5.  Patient will demonstrate/report ability to perform 18 inch chair transfer without UE assist.   Goal status: Ongoing  09/27/2023   6.  Patient will demonstrate up and down a flight of stairs with single hand rail with reciprocal gait pattern.  Goal status: Ongoing  09/27/2023   7.  patient will demonstrate AROM 0-120 for full range of motion necessary to perform job tasks Goal Status: Ongoing  09/27/2023   PLAN:  PT FREQUENCY: 3x/wk for 2 weeks, then 1-2x/week  PT DURATION: 10 weeks  PLANNED INTERVENTIONS: Can include 16109- PT Re-evaluation, 97110-Therapeutic exercises, 97530- Therapeutic activity, 97112- Neuromuscular re-education, 97535- Self Care, 97140- Manual therapy, [phone]- Gait training, [phone]- Orthotic Fit/training, [phone]- Canalith repositioning, [id number]- Aquatic Therapy, [phone]- Electrical stimulation (unattended), [phone]- Electrical stimulation (manual), [id number] Physical performance testing, 97016- Vasopneumatic device, [id number]- Ultrasound, [id number]- Traction (mechanical), [id number]- Ionotophoresis 4mg /ml Dexamethasone, Blood Flow Restriction Therapy,  Patient/Family education, Balance training, Stair training, Taping, Dry Needling, Joint mobilization, Joint manipulation, Spinal manipulation, Spinal mobilization, Scar mobilization, Vestibular training, Visual/preceptual remediation/compensation, DME instructions, Cryotherapy, and Moist heat.  All performed as medically necessary.  All included unless contraindicated  PLAN FOR NEXT SESSION: Use BFR (release BFR between doing another exercise), quadriceps and appropriate hamstrings/hip strengthening, vaso as needed. Progress gait with no device   Sharmon Leyden, PT, MPT 10/05/2023, 9:30 AM

## 2023-10-06 ENCOUNTER — Ambulatory Visit: Admitting: Physical Therapy

## 2023-10-06 ENCOUNTER — Encounter: Payer: Self-pay | Admitting: Physical Therapy

## 2023-10-06 DIAGNOSIS — G8929 Other chronic pain: Secondary | ICD-10-CM | POA: Diagnosis not present

## 2023-10-06 DIAGNOSIS — R2689 Other abnormalities of gait and mobility: Secondary | ICD-10-CM

## 2023-10-06 DIAGNOSIS — M6281 Muscle weakness (generalized): Secondary | ICD-10-CM

## 2023-10-06 DIAGNOSIS — M25562 Pain in left knee: Secondary | ICD-10-CM

## 2023-10-06 DIAGNOSIS — R600 Localized edema: Secondary | ICD-10-CM | POA: Diagnosis not present

## 2023-10-06 DIAGNOSIS — M25662 Stiffness of left knee, not elsewhere classified: Secondary | ICD-10-CM | POA: Diagnosis not present

## 2023-10-06 NOTE — Therapy (Signed)
 OUTPATIENT PHYSICAL THERAPY LOWER EXTREMITY TREATMENT   Patient Name: Jasmine Ortega MRN: [id number] DOB:[birth year], 43 y.o., female Today's Date: 10/06/2023  END OF SESSION:  PT End of Session - 10/06/23 0857     Visit Number 7    Number of Visits 20    Date for PT Re-Evaluation 12/01/23    Authorization Type [place] [employer]    Progress Note Due on Visit 10    PT Start Time 0845    PT Stop Time 0935    PT Time Calculation (min) 50 min    Activity Tolerance Patient tolerated treatment well;No increased pain    Behavior During Therapy WFL for tasks assessed/performed                 Past Medical History:  Diagnosis Date   Birth control    Chicken pox    Depression    GERD (gastroesophageal reflux disease)    Gestational diabetes    Past Surgical History:  Procedure Laterality Date   ALLOGRAFT APPLICATION Left 09/13/2023   Procedure: BONE -PATELLA-TENDON-BONE ALLOGRAFT;  Surgeon: Cammy Copa, MD;  Location: [facility] OR;  Service: Orthopedics;  Laterality: Left;   ANTERIOR CRUCIATE LIGAMENT REPAIR Left 09/13/2023   Procedure: LEFT KNEE ACL TEAR RECONSTRUCTION;  Surgeon: Cammy Copa, MD;  Location: [facility] OR;  Service: Orthopedics;  Laterality: Left;   BONE MARROW ASPIRATION FOR SPINE FUSION ONLY (BMAC) Left 09/13/2023   Procedure: BONE MARROW ASPIRATE CONCENTRATE FROM ILLIAC CREST;  Surgeon: Cammy Copa, MD;  Location: [facility] OR;  Service: Orthopedics;  Laterality: Left;   KNEE ARTHROSCOPY WITH MENISCAL REPAIR Left 09/13/2023   Procedure: MEDIAL MENISCAL TEAR REPAIR;  Surgeon: Cammy Copa, MD;  Location: [facility] OR;  Service: Orthopedics;  Laterality: Left;   LASIK Bilateral    NO PAST SURGERIES     Patient Active Problem List   Diagnosis Date Noted   Left anterior cruciate ligament tear 09/18/2023   Acute medial meniscus tear of left knee 09/18/2023   Pain in left knee 08/03/2023   Chronic low back pain without sciatica 09/23/2021   Dysthymia 09/23/2021    Obesity (BMI 30-39.9) 09/14/2019   Hyperlipidemia 09/14/2019   Gastroesophageal reflux disease 04/27/2017   Attention deficit hyperactivity disorder (ADHD), predominantly inattentive type 03/05/2015   Loss of transverse plantar arch of right foot 09/18/2014    PCP: Ronnald Nian, MD  REFERRING PROVIDER: Cammy Copa, MD  REFERRING DIAG:  [phone] (ICD-10-CM) - Rupture of anterior cruciate ligament of left knee, initial encounter S83.242D (ICD-10-CM) - Other tear of medial meniscus, current injury, left knee, subsequent encounter  THERAPY DIAG:  Muscle weakness (generalized)  Chronic pain of left knee  Stiffness of left knee, not elsewhere classified  Other abnormalities of gait and mobility  Localized edema  Rationale for Evaluation and Treatment: Rehabilitation  ONSET DATE: 09/13/2023 s/p Lt ACL with medial meniscus  SUBJECTIVE:   SUBJECTIVE STATEMENT: Jasmine Ortega arriving to therapy reporting 2/10 pain today, she can tell she worked out last time but not too sore  PERTINENT HISTORY: 3/18 surgery with Left LEFT KNEE ACL TEAR RECONSTRUCTION , BONE MARROW ASPIRATE CONCENTRATE, Left MEDIAL MENISCAL TEAR REPAIR, Left BONE -PATELLA-TENDON-BONE ALLOGRAFT Chronic LBP, HLD, GERD, ADHD  No hx of DVT- ok to do BFR  PAIN:  NPRS scale: 2/10 today  Pain location: Lt knee Pain description: burning, numbness Aggravating factors: too much WB, prolonged postures Relieving factors: ice, rest, elevation, pain meds- ibprophin, roboxin at bed time  PRECAUTIONS: Other:  ROM & strengthening closed chain; ok to WBAT in 7 days  WEIGHT BEARING RESTRICTIONS: "ok to WBAT in 7 days" per referral 09/28/2023. However will need clarification.   FALLS:  Has patient fallen in last 6 months? 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 No displaced fragment. Lateral meniscus:  Intact.   LIGAMENTS Cruciates:  The ACL is chronically torn.  The PCL is intact. Collaterals:  Intact.  CARTILAGE Patellofemoral:  Preserved. Medial:  Minimally degenerated. Lateral:  Minimally degenerated. Joint:  Small to moderate joint effusion. Popliteal Fossa:  No Baker's cyst. Extensor Mechanism:  Intact.   Bones: No fracture, stress change or worrisome lesion. Small enchondroma distal femur noted   Other: Subcutaneous edema anterior to the patella and patellar tendon compatible bursitis is seen.   IMPRESSION: 1. Longitudinal tear posterior horn and posterior body of the medial meniscus without displaced fragment. 2. Chronic ACL tear. 3. Mild medial and lateral compartment osteoarthritis. 4. Prepatellar bursitis.    PATIENT SURVEYS:  Patient-Specific Activity Scoring Scheme  "0" represents "unable to perform." "10" represents "able to perform at prior level. 0 1 2 3 4 5 6 7 8 9  10 (Date and Score)   Activity Eval 09/22/23    1. Full AROM of knee 1     2. Improved knee stability  1    3. Return to jujitsu 1   4. stairs 1   5. walking 1   Score 1/10    Total score = sum of the activity scores/number of activities Minimum detectable change (90%CI) for average score = 2 points Minimum detectable change (90%CI) for single activity score = 3 points  COGNITION: Evaluation on 09/22/2023:   Overall cognitive status: WFL    SENSATION: Evaluation on 09/22/2023:   [facility]  EDEMA:  Evaluation on 09/22/2023:  Not measured but left knee is larger by  observation.  She reports increased with activities.   POSTURE:  Evaluation on 09/22/2023:  No Significant postural limitations  PALPATION: Evaluation on 09/22/2023:  Tender to surgical sites and quadricep  LOWER EXTREMITY ROM:   ROM Left Eval 09/22/2023 Left 09/30/2023  Hip flexion    Hip extension    Hip abduction    Hip adduction    Hip internal rotation    Hip external rotation    Knee flexion Supine with strap AA: 84 Active at 101  Knee extension Supine quad set A: -7 Active -1  Ankle dorsiflexion    Ankle plantarflexion    Ankle inversion    Ankle eversion     (Blank rows = not tested)  LOWER EXTREMITY MMT:  Lt not tested at eval due to surgery  MMT Right Eval 09/22/23 Left Eval 09/22/23  Hip flexion 5 4  Hip extension    Hip abduction    Hip adduction    Hip internal rotation    Hip external rotation    Knee flexion 5 3-/5  Knee  extension 5 3-/5  Ankle dorsiflexion 5 5  Ankle plantarflexion 5 as seen in gait   Ankle inversion    Ankle eversion     (Blank rows = not tested)  FUNCTIONAL TESTS:  Evaluation on 09/22/2023: Rises from 18in chair with BUE use on armrests and LLE slightly in front to decrease knee bending  GAIT: Evaluation on 09/22/2023: Distance walked: clinical Assistive device utilized: Crutches Level of assistance: SBA Comments: crutches too short, PWB on LLE using 3pt gait, decreased cadence, decreased step length                                                                                                                                                                        TODAY'S TREATMENT                                                                          DATE:  10/06/23:  TherEx: Nu step L5 X 8 min UE/LE  Theractivity Seated straight leg raises  with BFR 168 mm Hg, reps of 20, 15, 15, 15 with 30 sec rest in between. Leg press BFR 168 mm Hg, DL 29# X 30 reps, then left leg only 25# reps of 15, 15, 15, with 30 sec rest in  between all sets. March walking at counter top with finger tip support to encourage more time and weight shift in stance for left leg as well as increased hip/knee flexion during swing phase Walking without crutch 100 feet X 2   Modalities:  Vaso Left knee medium Pressure 34* X 10 min  10/05/23:  TherEx: Sci fit bike L3, X 8 minutes total, seat  beginning on #10 and progressing to #8 Seated straight leg raises  with BFR 168 mm Hg, reps of 20, 15, 15, 15 with 30 sec rest in between. Sit to stand: BFR 168 mm Hg, reps of x 15, 10, 10 with 30 sec rest in between. Heel slides AAROM knee flexion stretch with strap 5 sec hold X10  Gait: Walking with single crutch 200 feet instructions in 3 point gait pattern and heel to toe on left LE Walking with no device 200 feet utilizing heel to toe gait pattern, (pt amb with slow gait velocity and caution)  Modalities:  Vaso Left knee medium Pressure 34* X 10 min     10/03/2023 Sci fit bike L1, X 8 minutes total, seat #8, rocked for 1 minute then full revolutions backwards for next  7 min Seated straight leg raises  with BFR 168 mm Hg, reps of 20, 15, 15, 15 with 30 sec rest in between. Standing TKE green band with  BFR 168 mm Hg, reps of 30, 15, 15, 15 with 30 sec rest in between. Heel slides AAROM knee flexion stretch with strap 5 sec hold X10   Vaso Left knee medium Pressure 34* X 10 min  09/30/2023 Seated straight leg raises 3 sets of 5 for 3 seconds with 1# Quad sets with BFR 168 mm Hg 30/15/15/15 reps with 1 minute rest after 30 and 30 second rest after 15, seated for all 3 sets Prone alternating hip extensions 2 sets of 10 for 3 seconds Reviewed HEP with Larita Fife  Neuromuscular re-education: Tandem balance with most weight on the uninvolved right side 2 x 20 seconds each  Vaso Left knee medium Pressure 34*   09/29/2023 Seated straight leg raises 2 sets of 5 for 3 seconds Quad sets with BFR 168 mm Hg 30/15/15/15 reps with 1 minute rest, 1st set  supine bilateral, last 3 sets seated Reviewed HEP with Larita Fife  Neuromuscular re-education: Tandem balance with most weight on the uninvolved right side 2 x 20 seconds each  Vaso Left knee medium Pressure 34*   09/27/2023 Therapeutic Exercise: Blood Flow Restriction Therapy  seated occulusion 210 work at 80% (168) LLE LAQ protocol 30 reps-15-15-15 with 30 second rest  Standing LLE TKE green theraband 5 sec hold 15 reps 2 sets BUE crutch support  Therapeutic Activities:  Leg press BLEs 75# 15 reps 2 sets Pt amb with axillary crutches LLE PWB step through pattern no nonverbal signs of pain.   Pt neg curb with axillary crutches LLE PWB safely.  Pt questioning sending CPM back as paying OOP weekly.  Pt said okay to return CPM as long as does knee flexion range exercises consistently.  Pt verbalized understanding.   Vaso left knee with elevation 34* high compression 10 min   PATIENT EDUCATION:  Education details: HEP, POC Person educated: Patient Education method: Explanation, Demonstration, Verbal cues, and Handouts Education comprehension: verbalized understanding, returned demonstration, and verbal cues required  HOME EXERCISE PROGRAM: Access Code: [email or web address] URL: [email or web address] Date: 09/30/2023 Prepared by: Pauletta Browns  Exercises - Ankle Alphabet in Elevation  - 2-3 x daily - 7 x weekly - 1 sets - 2-3 reps - elevate 10-15 min hold - Supine Active Straight Leg Raise (Mirrored)  - 2-3 x daily - 7 x weekly - 2 sets - 10 reps - 5 seconds hold - supine quad set with towel roll under ankle  - 2-3 x daily - 7 x weekly - 2 sets - 10 reps - 5 seconds hold - Supine Heel Slide with Strap  - 2-3 x daily - 7 x weekly - 2 sets - 10 reps - 5 seconds hold - seated single straight leg raise   - 2-3 x daily - 7 x weekly - 2 sets - 10 reps - 5 seconds hold - Seated Quad Set  - 2-3 x daily - 7 x weekly - 2 sets - 10 reps - 5 seconds hold - Seated Knee Flexion Extension AROM   -  2-3 x daily - 7 x weekly - 2 sets - 10 reps - 5 seconds hold - Prone Hip Extension  - 1 x daily - 7 x weekly - 2 sets - 10 reps - 3 seconds hold  ASSESSMENT:  CLINICAL IMPRESSION: She is progressing well up to this point  but will need continued strength work to reach functional goals. I did progress her strength program today some with good tolerance noted. Continue current PT plan.   OBJECTIVE IMPAIRMENTS: Abnormal gait, decreased activity tolerance, decreased balance, decreased coordination, decreased endurance, decreased knowledge of condition, decreased knowledge of use of DME, decreased mobility, difficulty walking, decreased ROM, decreased strength, increased edema, increased muscle spasms, and pain.   ACTIVITY LIMITATIONS: carrying, lifting, bending, sitting, standing, squatting, stairs, and locomotion level  PARTICIPATION LIMITATIONS: meal prep, cleaning, laundry, driving, shopping, community activity, and occupation  PERSONAL FACTORS: Profession, Time since onset of injury/illness/exacerbation, and 1-2 comorbidities: see above  are also affecting patient's functional outcome.   REHAB POTENTIAL: Good  CLINICAL DECISION MAKING: Stable/uncomplicated  EVALUATION COMPLEXITY: Low   GOALS: Goals reviewed with patient? Yes  SHORT TERM GOALS: (target date for Short term goals are 3 weeks 10/13/2023)   1.  Patient will demonstrate independent use of home exercise program to maintain progress from in clinic treatments.  Goal status: Ongoing  09/30/2023  2.  Patient will demonstrate AROM 0-95deg   Goal status: Partially Met 09/30/2023   LONG TERM GOALS: (target dates for all long term goals are 10 weeks  12/01/2023 )   1. Patient will demonstrate/report pain at worst less than or equal to 2/10 to facilitate minimal limitation in daily activity secondary to pain symptoms.  Goal status: Partially Met 09/29/2023   2. Patient will demonstrate independent use of home exercise program to  facilitate ability to maintain/progress functional gains from skilled physical therapy services.  Goal status: Ongoing  09/30/2023   3. Patient will demonstrate Patient specific functional scale avg > or = 7 to indicate reduced disability due to condition.   Goal status: Ongoing  09/27/2023   4.  Patient will demonstrate LLE MMT 5/5 throughout to faciltiate usual transfers, stairs, squatting at [facility] for daily life.   Goal status: Ongoing  09/27/2023   5.  Patient will demonstrate/report ability to perform 18 inch chair transfer without UE assist.   Goal status: Ongoing  09/27/2023   6.  Patient will demonstrate up and down a flight of stairs with single hand rail with reciprocal gait pattern.  Goal status: Ongoing  09/27/2023   7.  patient will demonstrate AROM 0-120 for full range of motion necessary to perform job tasks Goal Status: Ongoing  09/27/2023   PLAN:  PT FREQUENCY: 3x/wk for 2 weeks, then 1-2x/week  PT DURATION: 10 weeks  PLANNED INTERVENTIONS: Can include 40981- PT Re-evaluation, 97110-Therapeutic exercises, 97530- Therapeutic activity, 97112- Neuromuscular re-education, 97535- Self Care, 97140- Manual therapy, [phone]- Gait training, [phone]- Orthotic Fit/training, [phone]- Canalith repositioning, [id number]- Aquatic Therapy, [phone]- Electrical stimulation (unattended), [phone]- Electrical stimulation (manual), [id number] Physical performance testing, [id number]- Vasopneumatic device, [id number]- Ultrasound, [id number]- Traction (mechanical), [id number]- Ionotophoresis 4mg /ml Dexamethasone, Blood Flow Restriction Therapy,  Patient/Family education, Balance training, Stair training, Taping, Dry Needling, Joint mobilization, Joint manipulation, Spinal manipulation, Spinal mobilization, Scar mobilization, Vestibular training, Visual/preceptual remediation/compensation, DME instructions, Cryotherapy, and Moist heat.  All performed as medically necessary.  All included unless contraindicated  PLAN FOR NEXT SESSION:  How was  return to work? Use BFR (release BFR between doing another exercise), quadriceps and appropriate hamstrings/hip strengthening, vaso as needed. Progress gait with no device   April Manson, PT, DPT 10/06/2023, 9:31 AM

## 2023-10-07 ENCOUNTER — Other Ambulatory Visit (HOSPITAL_COMMUNITY): Payer: Self-pay

## 2023-10-12 ENCOUNTER — Ambulatory Visit: Admitting: Physical Therapy

## 2023-10-12 ENCOUNTER — Encounter: Payer: Self-pay | Admitting: Physical Therapy

## 2023-10-12 ENCOUNTER — Ambulatory Visit: Admitting: Surgical

## 2023-10-12 DIAGNOSIS — G8929 Other chronic pain: Secondary | ICD-10-CM | POA: Diagnosis not present

## 2023-10-12 DIAGNOSIS — R600 Localized edema: Secondary | ICD-10-CM

## 2023-10-12 DIAGNOSIS — M25662 Stiffness of left knee, not elsewhere classified: Secondary | ICD-10-CM

## 2023-10-12 DIAGNOSIS — M6281 Muscle weakness (generalized): Secondary | ICD-10-CM | POA: Diagnosis not present

## 2023-10-12 DIAGNOSIS — M25562 Pain in left knee: Secondary | ICD-10-CM

## 2023-10-12 DIAGNOSIS — R2689 Other abnormalities of gait and mobility: Secondary | ICD-10-CM | POA: Diagnosis not present

## 2023-10-12 DIAGNOSIS — Z9889 Other specified postprocedural states: Secondary | ICD-10-CM

## 2023-10-12 NOTE — Therapy (Signed)
 OUTPATIENT PHYSICAL THERAPY LOWER EXTREMITY TREATMENT   Patient Name: Jasmine Ortega MRN: [id number] DOB:[birth year], 43 y.o., female Today's Date: 10/12/2023  END OF SESSION:  PT End of Session - 10/12/23 0935     Visit Number 8    Number of Visits 20    Date for PT Re-Evaluation 12/01/23    Authorization Type [employer]    Progress Note Due on Visit 10    PT Start Time 0930    PT Stop Time 1018    PT Time Calculation (min) 48 min    Activity Tolerance Patient tolerated treatment well;No increased pain    Behavior During Therapy WFL for tasks assessed/performed                 Past Medical History:  Diagnosis Date   Birth control    Chicken pox    Depression    GERD (gastroesophageal reflux disease)    Gestational diabetes    Past Surgical History:  Procedure Laterality Date   ALLOGRAFT APPLICATION Left 09/13/2023   Procedure: BONE -PATELLA-TENDON-BONE ALLOGRAFT;  Surgeon: Cammy Copa, MD;  Location: [facility] OR;  Service: Orthopedics;  Laterality: Left;   ANTERIOR CRUCIATE LIGAMENT REPAIR Left 09/13/2023   Procedure: LEFT KNEE ACL TEAR RECONSTRUCTION;  Surgeon: Cammy Copa, MD;  Location: [facility] OR;  Service: Orthopedics;  Laterality: Left;   BONE MARROW ASPIRATION FOR SPINE FUSION ONLY (BMAC) Left 09/13/2023   Procedure: BONE MARROW ASPIRATE CONCENTRATE FROM ILLIAC CREST;  Surgeon: Cammy Copa, MD;  Location: [facility] OR;  Service: Orthopedics;  Laterality: Left;   KNEE ARTHROSCOPY WITH MENISCAL REPAIR Left 09/13/2023   Procedure: MEDIAL MENISCAL TEAR REPAIR;  Surgeon: Cammy Copa, MD;  Location: [facility] OR;  Service: Orthopedics;  Laterality: Left;   LASIK Bilateral    NO PAST SURGERIES     Patient Active Problem List   Diagnosis Date Noted   Left anterior cruciate ligament tear 09/18/2023   Acute medial meniscus tear of left knee 09/18/2023   Pain in left knee 08/03/2023   Chronic low back pain without sciatica 09/23/2021   Dysthymia 09/23/2021    Obesity (BMI 30-39.9) 09/14/2019   Hyperlipidemia 09/14/2019   Gastroesophageal reflux disease 04/27/2017   Attention deficit hyperactivity disorder (ADHD), predominantly inattentive type 03/05/2015   Loss of transverse plantar arch of right foot 09/18/2014    PCP: Ronnald Nian, MD  REFERRING PROVIDER: Cammy Copa, MD  REFERRING DIAG:  [id number] (ICD-10-CM) - Rupture of anterior cruciate ligament of left knee, initial encounter S83.242D (ICD-10-CM) - Other tear of medial meniscus, current injury, left knee, subsequent encounter  THERAPY DIAG:  Muscle weakness (generalized)  Chronic pain of left knee  Stiffness of left knee, not elsewhere classified  Other abnormalities of gait and mobility  Localized edema  Rationale for Evaluation and Treatment: Rehabilitation  ONSET DATE: 09/13/2023 s/p Lt ACL with medial meniscus  SUBJECTIVE:   SUBJECTIVE STATEMENT: Jasmine Ortega reports some soreness and pain after starting back at work but not too bad overall  PERTINENT HISTORY: 3/18 surgery with Left LEFT KNEE ACL TEAR RECONSTRUCTION , BONE MARROW ASPIRATE CONCENTRATE, Left MEDIAL MENISCAL TEAR REPAIR, Left BONE -PATELLA-TENDON-BONE ALLOGRAFT Chronic LBP, HLD, GERD, ADHD  No hx of DVT- ok to do BFR  PAIN:  NPRS scale: 3/10 today  Pain location: Lt knee Pain description: burning, numbness Aggravating factors: too much WB, prolonged postures Relieving factors: ice, rest, elevation, pain meds- ibprophin, roboxin at bed time  PRECAUTIONS: Other:  ROM &  strengthening closed chain; ok to WBAT in 7 days  WEIGHT BEARING RESTRICTIONS: "ok to WBAT in 7 days" per referral 09/28/2023. However will need clarification.   FALLS:  Has patient fallen in last 6 months? 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 No displaced fragment. Lateral meniscus:  Intact.   LIGAMENTS Cruciates:  The ACL is chronically torn.  The PCL is intact. Collaterals:  Intact.  CARTILAGE Patellofemoral:  Preserved. Medial:  Minimally degenerated. Lateral:  Minimally degenerated. Joint:  Small to moderate joint effusion. Popliteal Fossa:  No Baker's cyst. Extensor Mechanism:  Intact.   Bones: No fracture, stress change or worrisome lesion. Small enchondroma distal femur noted   Other: Subcutaneous edema anterior to the patella and patellar tendon compatible bursitis is seen.   IMPRESSION: 1. Longitudinal tear posterior horn and posterior body of the medial meniscus without displaced fragment. 2. Chronic ACL tear. 3. Mild medial and lateral compartment osteoarthritis. 4. Prepatellar bursitis.    PATIENT SURVEYS:  Patient-Specific Activity Scoring Scheme  "0" represents "unable to perform." "10" represents "able to perform at prior level. 0 1 2 3 4 5 6 7 8 9  10 (Date and Score)   Activity Eval 09/22/23    1. Full AROM of knee 1     2. Improved knee stability  1    3. Return to jujitsu 1   4. stairs 1   5. walking 1   Score 1/10    Total score = sum of the activity scores/number of activities Minimum detectable change (90%CI) for average score = 2 points Minimum detectable change (90%CI) for single activity score = 3 points  COGNITION: Evaluation on 09/22/2023:   Overall cognitive status: WFL    SENSATION: Evaluation on 09/22/2023:   [facility]  EDEMA:  Evaluation on 09/22/2023:  Not measured but left knee is larger by  observation.  She reports increased with activities.   POSTURE:  Evaluation on 09/22/2023:  No Significant postural limitations  PALPATION: Evaluation on 09/22/2023:  Tender to surgical sites and quadricep  LOWER EXTREMITY ROM:   ROM Left Eval 09/22/2023 Left 09/30/2023  Hip flexion    Hip extension    Hip abduction    Hip adduction    Hip internal rotation    Hip external rotation    Knee flexion Supine with strap AA: 84 Active at 101  Knee extension Supine quad set A: -7 Active -1  Ankle dorsiflexion    Ankle plantarflexion    Ankle inversion    Ankle eversion     (Blank rows = not tested)  LOWER EXTREMITY MMT:  Lt not tested at eval due to surgery  MMT Right Eval 09/22/23 Left Eval 09/22/23  Hip flexion 5 4  Hip extension    Hip abduction    Hip adduction    Hip internal rotation    Hip external rotation    Knee flexion 5 3-/5  Knee extension 5 3-/5  Ankle  dorsiflexion 5 5  Ankle plantarflexion 5 as seen in gait   Ankle inversion    Ankle eversion     (Blank rows = not tested)  FUNCTIONAL TESTS:  Evaluation on 09/22/2023: Rises from 18in chair with BUE use on armrests and LLE slightly in front to decrease knee bending  GAIT: Evaluation on 09/22/2023: Distance walked: clinical Assistive device utilized: Crutches Level of assistance: SBA Comments: crutches too short, PWB on LLE using 3pt gait, decreased cadence, decreased step length                                                                                                                                                                        TODAY'S TREATMENT                                                                          DATE:  10/12/23:  TherEx: Nu step L5 X 8 min UE/LE Supine heelslides AAROM with strap 5 sec X 10  Theractivity Step ups with BFR 168 mm Hg, 4 inch step light UE support, leading with left leg, reps of 30, then 6 inch step, light UE support reps of, 15, 15, 15 with 30 sec  rest in between. Leg press BFR 168 mm Hg, DL 04# X 30 reps, then left leg only 31# reps of 15, 15, 15, with 30 sec rest in between all sets. Walking without crutch 100 feet X 2   Modalities:  Vaso Left knee medium Pressure 34* X 10 min    10/06/23:  TherEx: Nu step L5 X 8 min UE/LE   Theractivity Seated straight leg raises  with BFR 168 mm Hg, reps of 20, 15, 15, 15 with 30 sec rest in between. Leg press BFR 168 mm Hg, DL 54# X 30 reps, then left leg only 25# reps of 15, 15, 15, with 30 sec rest in between all sets. March walking at counter top with finger tip support to encourage more time and weight shift in stance for left leg as well as increased hip/knee flexion during swing phase Walking without crutch 100 feet X 2    Modalities:  Vaso Left knee medium Pressure 34* X 10 min   PATIENT EDUCATION:  Education details: HEP, POC Person educated: Patient Education method: Programmer, multimedia, Demonstration, Verbal cues, and Handouts Education comprehension: verbalized understanding, returned demonstration, and verbal cues required  HOME EXERCISE PROGRAM: Access Code: [email or web address] URL: [email or web address] Date: 09/30/2023 Prepared by: Porfirio Bristol  [place]  Exercises - Ankle Alphabet in Elevation  - 2-3 x daily - 7 x weekly - 1 sets - 2-3 reps - elevate 10-15 min hold - Supine Active Straight Leg Raise (Mirrored)  - 2-3 x daily - 7 x weekly - 2 sets - 10 reps - 5 seconds hold - supine quad set with towel roll under ankle  - 2-3 x daily - 7 x weekly - 2 sets - 10 reps - 5 seconds hold - Supine Heel Slide with Strap  - 2-3 x daily - 7 x weekly - 2 sets - 10 reps - 5 seconds hold - seated single straight leg raise   - 2-3 x daily - 7 x weekly - 2 sets - 10 reps - 5 seconds hold - Seated Quad Set  - 2-3 x daily - 7 x weekly - 2 sets - 10 reps - 5 seconds hold - Seated Knee Flexion Extension AROM   - 2-3 x daily - 7 x weekly - 2 sets - 10 reps - 5 seconds hold - Prone Hip Extension  -  1 x daily - 7 x weekly - 2 sets - 10 reps - 3 seconds hold  ASSESSMENT:  CLINICAL IMPRESSION: She continues to do well up to this point. Had good tolerance to strength progressions with BFR again. We will monitor for any soreness and adjust as needed.   OBJECTIVE IMPAIRMENTS: Abnormal gait, decreased activity tolerance, decreased balance, decreased coordination, decreased endurance, decreased knowledge of condition, decreased knowledge of use of DME, decreased mobility, difficulty walking, decreased ROM, decreased strength, increased edema, increased muscle spasms, and pain.   ACTIVITY LIMITATIONS: carrying, lifting, bending, sitting, standing, squatting, stairs, and locomotion level  PARTICIPATION LIMITATIONS: meal prep, cleaning, laundry, driving, shopping, community activity, and occupation  PERSONAL FACTORS: Profession, Time since onset of injury/illness/exacerbation, and 1-2 comorbidities: see above  are also affecting patient's functional outcome.   REHAB POTENTIAL: Good  CLINICAL DECISION MAKING: Stable/uncomplicated  EVALUATION COMPLEXITY: Low   GOALS: Goals reviewed with patient? Yes  SHORT TERM GOALS: (target date for Short term goals are 3 weeks 10/13/2023)   1.  Patient will demonstrate independent use of home exercise program to maintain progress from in clinic treatments.  Goal status: Ongoing  09/30/2023  2.  Patient will demonstrate AROM 0-95deg   Goal status: Partially Met 09/30/2023   LONG TERM GOALS: (target dates for all long term goals are 10 weeks  12/01/2023 )   1. Patient will demonstrate/report pain at worst less than or equal to 2/10 to facilitate minimal limitation in daily activity secondary to pain symptoms.  Goal status: Partially Met 09/29/2023   2. Patient will demonstrate independent use of home exercise program to facilitate ability to maintain/progress functional gains from skilled physical therapy services.  Goal status: Ongoing  09/30/2023   3.  Patient will demonstrate Patient specific functional scale avg > or = 7 to indicate reduced disability due to condition.   Goal status: Ongoing  09/27/2023   4.  Patient will demonstrate LLE MMT 5/5 throughout to faciltiate usual transfers, stairs, squatting at [facility] for daily life.   Goal status: Ongoing  09/27/2023   5.  Patient will demonstrate/report ability to perform 18 inch chair transfer without UE assist.   Goal status: Ongoing  09/27/2023   6.  Patient will demonstrate up and down a flight of stairs with single hand rail with reciprocal gait pattern.  Goal status: Ongoing  09/27/2023   7.  patient  will demonstrate AROM 0-120 for full range of motion necessary to perform job tasks Goal Status: Ongoing  09/27/2023   PLAN:  PT FREQUENCY: 3x/wk for 2 weeks, then 1-2x/week  PT DURATION: 10 weeks  PLANNED INTERVENTIONS: Can include 45409- PT Re-evaluation, 97110-Therapeutic exercises, 97530- Therapeutic activity, 97112- Neuromuscular re-education, 97535- Self Care, 97140- Manual therapy, [phone]- Gait training, [phone]- Orthotic Fit/training, [phone]- Canalith repositioning, [id number]- Aquatic Therapy, [phone]- Electrical stimulation (unattended), [phone]- Electrical stimulation (manual), [id number] Physical performance testing, 97016- Vasopneumatic device, [id number]- Ultrasound, [id number]- Traction (mechanical), [id number]- Ionotophoresis 4mg /ml Dexamethasone, Blood Flow Restriction Therapy,  Patient/Family education, Balance training, Stair training, Taping, Dry Needling, Joint mobilization, Joint manipulation, Spinal manipulation, Spinal mobilization, Scar mobilization, Vestibular training, Visual/preceptual remediation/compensation, DME instructions, Cryotherapy, and Moist heat.  All performed as medically necessary.  All included unless contraindicated  PLAN FOR NEXT SESSION:  Use BFR (release BFR between doing another exercise), quadriceps and appropriate hamstrings/hip strengthening, vaso as needed. Progress gait with  no device   Mick Alamin, PT, DPT 10/12/2023, 9:36 AM

## 2023-10-13 ENCOUNTER — Encounter: Payer: Self-pay | Admitting: Surgical

## 2023-10-13 NOTE — Progress Notes (Signed)
 Post-Op Visit Note   Patient: Jasmine Ortega           Date of Birth: February 04, 1981           MRN: [id number] Visit Date: 10/12/2023 PCP: Ronnald Nian, MD   Assessment & Plan:  Chief Complaint:  Chief Complaint  Patient presents with   Left Knee - Follow-up    09/13/2023  left knee ACL reconstruction with bone patella tendon bone allograft with the MAC and medial meniscal debridement   Visit Diagnoses:  1. S/P ACL reconstruction     Plan: Patient is a 43 year old female who presents s/p left knee anterior cruciate ligament reconstruction on 09/13/2023.  She states that her left knee is doing very well.  She is progressing well with physical therapy and has been able to wean off of crutches.  She has mild pain that is overall controlled with ibuprofen.  Pain is worse at the end of the day.  She has returned back to work with not too much trouble but she does report a fair amount of swelling at the end of the day around the left knee and the distal leg.  She denies any instability of the knee.  No mechanical symptoms in the knee.  On exam, patient has 0 degrees extension and 105 degrees of knee flexion.  Incisions are healing well without evidence of infection or dehiscence.  No calf tenderness.  Negative Homans' sign.  Palpable PT pulse.  ACL graft is stable by anterior drawer and Lachman exam.  Excellent quad strength rated 5/5.  Excellent hamstring strength rated 5/5.  She is able to perform straight leg raise without extensor lag.  No significant effusion noted.  Fairly painless range of motion.  Plan is to continue with physical therapy.  She has done a great job so far in the first month.  We discussed restrictions in terms of what exercises to do and what to avoid.  We will see her back in 1 month for clinical recheck with Dr. August Saucer but she may call with any concerns in the meantime.  Follow-Up Instructions: No follow-ups on file.   Orders:  No orders of the defined types were placed  in this encounter.  No orders of the defined types were placed in this encounter.   Imaging: No results found.  PMFS History: Patient Active Problem List   Diagnosis Date Noted   Left anterior cruciate ligament tear 09/18/2023   Acute medial meniscus tear of left knee 09/18/2023   Pain in left knee 08/03/2023   Chronic low back pain without sciatica 09/23/2021   Dysthymia 09/23/2021   Obesity (BMI 30-39.9) 09/14/2019   Hyperlipidemia 09/14/2019   Gastroesophageal reflux disease 04/27/2017   Attention deficit hyperactivity disorder (ADHD), predominantly inattentive type 03/05/2015   Loss of transverse plantar arch of right foot 09/18/2014   Past Medical History:  Diagnosis Date   Birth control    Chicken pox    Depression    GERD (gastroesophageal reflux disease)    Gestational diabetes     Family History  Problem Relation Age of Onset   Breast cancer Mother    Cancer Mother 15       Breast cancer   Neuropathy Father    Arthritis Father    Hyperlipidemia Father    Hypertension Father    Stroke Father    Migraines Sister    Mitral valve prolapse Sister    Stroke Maternal Grandmother    Miscarriages /  Stillbirths Maternal Grandfather    Arthritis Paternal Grandmother    Heart disease Paternal Grandmother    Diabetes Paternal Grandfather     Past Surgical History:  Procedure Laterality Date   ALLOGRAFT APPLICATION Left 09/13/2023   Procedure: BONE -PATELLA-TENDON-BONE ALLOGRAFT;  Surgeon: Jasmine Mesi, MD;  Location: [facility] OR;  Service: Orthopedics;  Laterality: Left;   ANTERIOR CRUCIATE LIGAMENT REPAIR Left 09/13/2023   Procedure: LEFT KNEE ACL TEAR RECONSTRUCTION;  Surgeon: Jasmine Mesi, MD;  Location: [facility] OR;  Service: Orthopedics;  Laterality: Left;   BONE MARROW ASPIRATION FOR SPINE FUSION ONLY (BMAC) Left 09/13/2023   Procedure: BONE MARROW ASPIRATE CONCENTRATE FROM ILLIAC CREST;  Surgeon: Jasmine Mesi, MD;  Location: [facility] OR;  Service:  Orthopedics;  Laterality: Left;   KNEE ARTHROSCOPY WITH MENISCAL REPAIR Left 09/13/2023   Procedure: MEDIAL MENISCAL TEAR REPAIR;  Surgeon: Jasmine Mesi, MD;  Location: [facility] OR;  Service: Orthopedics;  Laterality: Left;   LASIK Bilateral    NO PAST SURGERIES     Social History   Occupational History   Not on file  Tobacco Use   Smoking status: Never   Smokeless tobacco: Never  Vaping Use   Vaping status: Never Used  Substance and Sexual Activity   Alcohol use: Yes    Alcohol/week: 2.0 standard drinks of alcohol    Types: 2 Glasses of wine per week    Comment: occas   Drug use: No   Sexual activity: Yes

## 2023-10-19 ENCOUNTER — Ambulatory Visit: Admitting: Physical Therapy

## 2023-10-19 ENCOUNTER — Encounter: Payer: Self-pay | Admitting: Physical Therapy

## 2023-10-19 DIAGNOSIS — M6281 Muscle weakness (generalized): Secondary | ICD-10-CM | POA: Diagnosis not present

## 2023-10-19 DIAGNOSIS — G8929 Other chronic pain: Secondary | ICD-10-CM | POA: Diagnosis not present

## 2023-10-19 DIAGNOSIS — M25662 Stiffness of left knee, not elsewhere classified: Secondary | ICD-10-CM

## 2023-10-19 DIAGNOSIS — M25562 Pain in left knee: Secondary | ICD-10-CM

## 2023-10-19 DIAGNOSIS — R2689 Other abnormalities of gait and mobility: Secondary | ICD-10-CM

## 2023-10-19 DIAGNOSIS — R600 Localized edema: Secondary | ICD-10-CM | POA: Diagnosis not present

## 2023-10-19 NOTE — Therapy (Signed)
 OUTPATIENT PHYSICAL THERAPY LOWER EXTREMITY TREATMENT   Patient Name: LIVIANNA PETRAGLIA MRN: [id number] DOB:[birth year], 43 y.o., female Today's Date: 10/19/2023  END OF SESSION:        Past Medical History:  Diagnosis Date   Birth control    Chicken pox    Depression    GERD (gastroesophageal reflux disease)    Gestational diabetes    Past Surgical History:  Procedure Laterality Date   ALLOGRAFT APPLICATION Left 09/13/2023   Procedure: BONE -PATELLA-TENDON-BONE ALLOGRAFT;  Surgeon: Jasmine Mesi, MD;  Location: [facility] OR;  Service: Orthopedics;  Laterality: Left;   ANTERIOR CRUCIATE LIGAMENT REPAIR Left 09/13/2023   Procedure: LEFT KNEE ACL TEAR RECONSTRUCTION;  Surgeon: Jasmine Mesi, MD;  Location: [facility] OR;  Service: Orthopedics;  Laterality: Left;   BONE MARROW ASPIRATION FOR SPINE FUSION ONLY (BMAC) Left 09/13/2023   Procedure: BONE MARROW ASPIRATE CONCENTRATE FROM ILLIAC CREST;  Surgeon: Jasmine Mesi, MD;  Location: [facility] OR;  Service: Orthopedics;  Laterality: Left;   KNEE ARTHROSCOPY WITH MENISCAL REPAIR Left 09/13/2023   Procedure: MEDIAL MENISCAL TEAR REPAIR;  Surgeon: Jasmine Mesi, MD;  Location: [facility] OR;  Service: Orthopedics;  Laterality: Left;   LASIK Bilateral    NO PAST SURGERIES     Patient Active Problem List   Diagnosis Date Noted   Left anterior cruciate ligament tear 09/18/2023   Acute medial meniscus tear of left knee 09/18/2023   Pain in left knee 08/03/2023   Chronic low back pain without sciatica 09/23/2021   Dysthymia 09/23/2021   Obesity (BMI 30-39.9) 09/14/2019   Hyperlipidemia 09/14/2019   Gastroesophageal reflux disease 04/27/2017   Attention deficit hyperactivity disorder (ADHD), predominantly inattentive type 03/05/2015   Loss of transverse plantar arch of right foot 09/18/2014    PCP: Watson Hacking, MD  REFERRING PROVIDER: Jasmine Mesi, MD  REFERRING DIAG:  [id number] (ICD-10-CM) - Rupture of anterior cruciate  ligament of left knee, initial encounter S83.242D (ICD-10-CM) - Other tear of medial meniscus, current injury, left knee, subsequent encounter  THERAPY DIAG:  No diagnosis found.  Rationale for Evaluation and Treatment: Rehabilitation  ONSET DATE: 09/13/2023 s/p Lt ACL with medial meniscus  SUBJECTIVE:   SUBJECTIVE STATEMENT: More uncomfortable today   PERTINENT HISTORY: 3/18 surgery with Left LEFT KNEE ACL TEAR RECONSTRUCTION , BONE MARROW ASPIRATE CONCENTRATE, Left MEDIAL MENISCAL TEAR REPAIR, Left BONE -PATELLA-TENDON-BONE ALLOGRAFT Chronic LBP, HLD, GERD, ADHD  No hx of DVT- ok to do BFR  PAIN:  NPRS scale: 3/10 today  Pain location: Lt knee Pain description: burning, numbness Aggravating factors: too much WB, prolonged postures Relieving factors: ice, rest, elevation, pain meds- ibprophin, roboxin at bed time  PRECAUTIONS: Other:  ROM & strengthening closed chain; ok to WBAT in 7 days  WEIGHT BEARING RESTRICTIONS: "ok to WBAT in 7 days" per referral 09/28/2023. However will need clarification.   FALLS:  Has patient fallen in last 6 months? 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  No displaced fragment. Lateral meniscus:  Intact.   LIGAMENTS Cruciates:  The ACL is chronically torn.  The PCL is intact. Collaterals:  Intact.  CARTILAGE Patellofemoral:  Preserved. Medial:  Minimally degenerated. Lateral:  Minimally degenerated. Joint:  Small to moderate  joint effusion. Popliteal Fossa:  No Baker's cyst. Extensor Mechanism:  Intact.   Bones: No fracture, stress change or worrisome lesion. Small enchondroma distal femur noted   Other: Subcutaneous edema anterior to the patella and patellar tendon compatible bursitis is seen.   IMPRESSION: 1. Longitudinal tear posterior horn and posterior body of the medial meniscus without displaced fragment. 2. Chronic ACL tear. 3. Mild medial and lateral compartment osteoarthritis. 4. Prepatellar bursitis.    PATIENT SURVEYS:  Patient-Specific Activity Scoring Scheme  "0" represents "unable to perform." "10" represents "able to perform at prior level. 0 1 2 3 4 5 6 7 8 9  10 (Date and Score)   Activity Eval 09/22/23    1. Full AROM of knee 1     2. Improved knee stability  1    3. Return to jujitsu 1   4. stairs 1   5. walking 1   Score 1/10    Total score = sum of the activity scores/number of activities Minimum detectable change (90%CI) for average score = 2 points Minimum detectable change (90%CI) for single activity score = 3 points  COGNITION: Evaluation on 09/22/2023:   Overall cognitive status: WFL    SENSATION: Evaluation on 09/22/2023:   [facility]  EDEMA:  Evaluation on 09/22/2023:  Not measured but left knee is larger by observation.  She reports increased with activities.   POSTURE:  Evaluation on 09/22/2023:  No Significant postural limitations  PALPATION: Evaluation on 09/22/2023:  Tender to surgical sites and quadricep  LOWER EXTREMITY ROM:   ROM Left Eval 09/22/2023 Left 09/30/2023  Hip flexion    Hip extension    Hip abduction    Hip adduction    Hip internal rotation    Hip external rotation    Knee flexion Supine with strap AA: 84 Active at 101  Knee extension Supine quad set A: -7 Active -1  Ankle dorsiflexion    Ankle plantarflexion    Ankle inversion    Ankle eversion     (Blank rows = not tested)  LOWER EXTREMITY MMT:  Lt not tested at eval due to  surgery  MMT Right Eval 09/22/23 Left Eval 09/22/23  Hip flexion 5 4  Hip extension    Hip abduction    Hip adduction    Hip internal rotation    Hip external rotation    Knee flexion 5 3-/5  Knee extension 5 3-/5  Ankle dorsiflexion 5 5  Ankle plantarflexion 5 as seen in gait   Ankle inversion    Ankle eversion     (Blank rows = not tested)  FUNCTIONAL TESTS:  Evaluation on 09/22/2023: Rises from 18in chair with BUE use on armrests and LLE slightly in front to decrease knee bending  GAIT: Evaluation on 09/22/2023: Distance walked: clinical Assistive device utilized: Crutches Level of assistance: SBA Comments: crutches too short, PWB on LLE using 3pt gait, decreased cadence, decreased step length  TODAY'S TREATMENT                                                                          DATE:  10/19/23 TherEx Recumbent bike L3 x 8 min; seat 5  TherAct With BFR cuff size 4; 168 mmHg; leg press 56# x 30 reps; 75# 2x15; LLE only 31# x 30 reps; 2x15 LLE on 4" step with Rt heel taps 3x15  Modalities Vaso Left knee medium Pressure 34* X 10 min   10/12/23:  TherEx: Nu step L5 X 8 min UE/LE Supine heelslides AAROM with strap 5 sec X 10  Theractivity Step ups with BFR 168 mm Hg, 4 inch step light UE support, leading with left leg, reps of 30, then 6 inch step, light UE support reps of, 15, 15, 15 with 30 sec rest in between. Leg press BFR 168 mm Hg, DL 16# X 30 reps, then left leg only 31# reps of 15, 15, 15, with 30 sec rest in between all sets. Walking without crutch 100 feet X 2   Modalities:  Vaso Left knee medium Pressure 34* X 10 min    10/06/23:  TherEx: Nu step L5 X 8 min UE/LE   Theractivity Seated straight leg raises  with BFR 168 mm Hg, reps of 20, 15, 15, 15 with 30 sec rest in between. Leg  press BFR 168 mm Hg, DL 10# X 30 reps, then left leg only 25# reps of 15, 15, 15, with 30 sec rest in between all sets. March walking at counter top with finger tip support to encourage more time and weight shift in stance for left leg as well as increased hip/knee flexion during swing phase Walking without crutch 100 feet X 2    Modalities:  Vaso Left knee medium Pressure 34* X 10 min   PATIENT EDUCATION:  Education details: HEP, POC Person educated: Patient Education method: Programmer, multimedia, Demonstration, Verbal cues, and Handouts Education comprehension: verbalized understanding, returned demonstration, and verbal cues required  HOME EXERCISE PROGRAM: Access Code: [email or web address] URL: [email or web address] Date: 09/30/2023 Prepared by: Terral Ferrari  Exercises - Ankle Alphabet in Elevation  - 2-3 x daily - 7 x weekly - 1 sets - 2-3 reps - elevate 10-15 min hold - Supine Active Straight Leg Raise (Mirrored)  - 2-3 x daily - 7 x weekly - 2 sets - 10 reps - 5 seconds hold - supine quad set with towel roll under ankle  - 2-3 x daily - 7 x weekly - 2 sets - 10 reps - 5 seconds hold - Supine Heel Slide with Strap  - 2-3 x daily - 7 x weekly - 2 sets - 10 reps - 5 seconds hold - seated single straight leg raise   - 2-3 x daily - 7 x weekly - 2 sets - 10 reps - 5 seconds hold - Seated Quad Set  - 2-3 x daily - 7 x weekly - 2 sets - 10 reps - 5 seconds hold - Seated Knee Flexion Extension AROM   - 2-3 x daily - 7 x weekly - 2 sets - 10 reps - 5 seconds hold - Prone Hip Extension  - 1 x daily - 7 x weekly -  2 sets - 10 reps - 3 seconds hold  ASSESSMENT:  CLINICAL IMPRESSION Pt tolerated session well today with continued focus on strengthening and BFR.  Will continue to benefit from PT to maximize function.   OBJECTIVE IMPAIRMENTS: Abnormal gait, decreased activity tolerance, decreased balance, decreased coordination, decreased endurance, decreased knowledge of condition, decreased  knowledge of use of DME, decreased mobility, difficulty walking, decreased ROM, decreased strength, increased edema, increased muscle spasms, and pain.   ACTIVITY LIMITATIONS: carrying, lifting, bending, sitting, standing, squatting, stairs, and locomotion level  PARTICIPATION LIMITATIONS: meal prep, cleaning, laundry, driving, shopping, community activity, and occupation  PERSONAL FACTORS: Profession, Time since onset of injury/illness/exacerbation, and 1-2 comorbidities: see above  are also affecting patient's functional outcome.   REHAB POTENTIAL: Good  CLINICAL DECISION MAKING: Stable/uncomplicated  EVALUATION COMPLEXITY: Low   GOALS: Goals reviewed with patient? Yes  SHORT TERM GOALS: (target date for Short term goals are 3 weeks 10/13/2023)   1.  Patient will demonstrate independent use of home exercise program to maintain progress from in clinic treatments.  Goal status: Ongoing  09/30/2023  2.  Patient will demonstrate AROM 0-95deg   Goal status: Partially Met 09/30/2023   LONG TERM GOALS: (target dates for all long term goals are 10 weeks  12/01/2023 )   1. Patient will demonstrate/report pain at worst less than or equal to 2/10 to facilitate minimal limitation in daily activity secondary to pain symptoms.  Goal status: Partially Met 09/29/2023   2. Patient will demonstrate independent use of home exercise program to facilitate ability to maintain/progress functional gains from skilled physical therapy services.  Goal status: Ongoing  09/30/2023   3. Patient will demonstrate Patient specific functional scale avg > or = 7 to indicate reduced disability due to condition.   Goal status: Ongoing  09/27/2023   4.  Patient will demonstrate LLE MMT 5/5 throughout to faciltiate usual transfers, stairs, squatting at [facility] for daily life.   Goal status: Ongoing  09/27/2023   5.  Patient will demonstrate/report ability to perform 18 inch chair transfer without UE assist.   Goal status:  Ongoing  09/27/2023   6.  Patient will demonstrate up and down a flight of stairs with single hand rail with reciprocal gait pattern.  Goal status: Ongoing  09/27/2023   7.  patient will demonstrate AROM 0-120 for full range of motion necessary to perform job tasks Goal Status: Ongoing  09/27/2023   PLAN:  PT FREQUENCY: 3x/wk for 2 weeks, then 1-2x/week  PT DURATION: 10 weeks  PLANNED INTERVENTIONS: Can include 56213- PT Re-evaluation, 97110-Therapeutic exercises, 97530- Therapeutic activity, 97112- Neuromuscular re-education, 97535- Self Care, 97140- Manual therapy, [phone]- Gait training, [phone]- Orthotic Fit/training, [phone]- Canalith repositioning, [id number]- Aquatic Therapy, [phone]- Electrical stimulation (unattended), [phone]- Electrical stimulation (manual), [id number] Physical performance testing, 97016- Vasopneumatic device, [id number]- Ultrasound, [id number]- Traction (mechanical), [id number]- Ionotophoresis 4mg /ml Dexamethasone , Blood Flow Restriction Therapy,  Patient/Family education, Balance training, Stair training, Taping, Dry Needling, Joint mobilization, Joint manipulation, Spinal manipulation, Spinal mobilization, Scar mobilization, Vestibular training, Visual/preceptual remediation/compensation, DME instructions, Cryotherapy, and Moist heat.  All performed as medically necessary.  All included unless contraindicated  PLAN FOR NEXT SESSION:  Continue Use BFR (release BFR between doing another exercise), quadriceps and appropriate hamstrings/hip strengthening, vaso as needed. Progress gait with no device   Next MD visit: 11/09/2023   Marley Simmers, PT, DPT 10/19/23 7:47 AM

## 2023-10-26 ENCOUNTER — Encounter: Payer: Self-pay | Admitting: Physical Therapy

## 2023-10-26 ENCOUNTER — Ambulatory Visit: Admitting: Physical Therapy

## 2023-10-26 DIAGNOSIS — M6281 Muscle weakness (generalized): Secondary | ICD-10-CM

## 2023-10-26 DIAGNOSIS — M25562 Pain in left knee: Secondary | ICD-10-CM

## 2023-10-26 DIAGNOSIS — R2689 Other abnormalities of gait and mobility: Secondary | ICD-10-CM | POA: Diagnosis not present

## 2023-10-26 DIAGNOSIS — F331 Major depressive disorder, recurrent, moderate: Secondary | ICD-10-CM | POA: Diagnosis not present

## 2023-10-26 DIAGNOSIS — M25662 Stiffness of left knee, not elsewhere classified: Secondary | ICD-10-CM

## 2023-10-26 DIAGNOSIS — R600 Localized edema: Secondary | ICD-10-CM | POA: Diagnosis not present

## 2023-10-26 DIAGNOSIS — G8929 Other chronic pain: Secondary | ICD-10-CM | POA: Diagnosis not present

## 2023-10-26 NOTE — Therapy (Signed)
 OUTPATIENT PHYSICAL THERAPY LOWER EXTREMITY TREATMENT   Patient Name: Jasmine Ortega MRN: [id number] DOB:1981-03-17, 43 y.o., female Today's Date: 10/26/2023  END OF SESSION:  PT End of Session - 10/26/23 1513     Visit Number 10    Number of Visits 20    Date for PT Re-Evaluation 12/01/23    Authorization Type [employer]    PT Start Time 1505    PT Stop Time 1559    PT Time Calculation (min) 54 min    Activity Tolerance Patient tolerated treatment well;No increased pain    Behavior During Therapy WFL for tasks assessed/performed                  Past Medical History:  Diagnosis Date   Birth control    Chicken pox    Depression    GERD (gastroesophageal reflux disease)    Gestational diabetes    Past Surgical History:  Procedure Laterality Date   ALLOGRAFT APPLICATION Left 09/13/2023   Procedure: BONE -PATELLA-TENDON-BONE ALLOGRAFT;  Surgeon: Jasmine Mesi, MD;  Location: [facility] OR;  Service: Orthopedics;  Laterality: Left;   ANTERIOR CRUCIATE LIGAMENT REPAIR Left 09/13/2023   Procedure: LEFT KNEE ACL TEAR RECONSTRUCTION;  Surgeon: Jasmine Mesi, MD;  Location: [facility] OR;  Service: Orthopedics;  Laterality: Left;   BONE MARROW ASPIRATION FOR SPINE FUSION ONLY (BMAC) Left 09/13/2023   Procedure: BONE MARROW ASPIRATE CONCENTRATE FROM ILLIAC CREST;  Surgeon: Jasmine Mesi, MD;  Location: [facility] OR;  Service: Orthopedics;  Laterality: Left;   KNEE ARTHROSCOPY WITH MENISCAL REPAIR Left 09/13/2023   Procedure: MEDIAL MENISCAL TEAR REPAIR;  Surgeon: Jasmine Mesi, MD;  Location: [facility] OR;  Service: Orthopedics;  Laterality: Left;   LASIK Bilateral    NO PAST SURGERIES     Patient Active Problem List   Diagnosis Date Noted   Left anterior cruciate ligament tear 09/18/2023   Acute medial meniscus tear of left knee 09/18/2023   Pain in left knee 08/03/2023   Chronic low back pain without sciatica 09/23/2021   Dysthymia 09/23/2021   Obesity (BMI 30-39.9)  09/14/2019   Hyperlipidemia 09/14/2019   Gastroesophageal reflux disease 04/27/2017   Attention deficit hyperactivity disorder (ADHD), predominantly inattentive type 03/05/2015   Loss of transverse plantar arch of right foot 09/18/2014    PCP: Watson Hacking, MD  REFERRING PROVIDER: Jasmine Mesi, MD  REFERRING DIAG:  [phone] (ICD-10-CM) - Rupture of anterior cruciate ligament of left knee, initial encounter S83.242D (ICD-10-CM) - Other tear of medial meniscus, current injury, left knee, subsequent encounter  THERAPY DIAG:  Muscle weakness (generalized)  Chronic pain of left knee  Stiffness of left knee, not elsewhere classified  Other abnormalities of gait and mobility  Localized edema  Rationale for Evaluation and Treatment: Rehabilitation  ONSET DATE: 09/13/2023 s/p Lt ACL with medial meniscus  SUBJECTIVE:   SUBJECTIVE STATEMENT: More swollen today   PERTINENT HISTORY: 3/18 surgery with Left LEFT KNEE ACL TEAR RECONSTRUCTION , BONE MARROW ASPIRATE CONCENTRATE, Left MEDIAL MENISCAL TEAR REPAIR, Left BONE -PATELLA-TENDON-BONE ALLOGRAFT Chronic LBP, HLD, GERD, ADHD  No hx of DVT- ok to do BFR  PAIN:  NPRS scale: 3/10 today  Pain location: Lt knee Pain description: burning, numbness Aggravating factors: too much WB, prolonged postures Relieving factors: ice, rest, elevation, pain meds- ibprophin, roboxin at bed time  PRECAUTIONS: Other:  ROM & strengthening closed chain; ok to WBAT in 7 days  WEIGHT BEARING RESTRICTIONS: "ok to WBAT in 7 days"  per referral 09/28/2023. However will need clarification.   FALLS:  Has patient fallen in last 6 months? 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 No displaced fragment. Lateral meniscus:  Intact.   LIGAMENTS Cruciates:  The ACL is chronically torn.  The PCL is intact. Collaterals:  Intact.  CARTILAGE Patellofemoral:  Preserved. Medial:  Minimally degenerated. Lateral:  Minimally degenerated. Joint:  Small to moderate joint effusion. Popliteal Fossa:  No Baker's cyst. Extensor Mechanism:  Intact.   Bones: No fracture, stress change or worrisome lesion. Small enchondroma distal femur noted   Other: Subcutaneous edema anterior to the patella and patellar tendon compatible bursitis is seen.   IMPRESSION: 1. Longitudinal tear posterior horn and posterior body of the medial meniscus without displaced fragment. 2. Chronic ACL tear. 3. Mild medial and lateral compartment osteoarthritis. 4. Prepatellar bursitis.    PATIENT SURVEYS:  Patient-Specific Activity Scoring Scheme  "0" represents "unable to perform." "10" represents "able to perform at prior level. 0 1 2 3 4 5 6 7 8 9  10 (Date and Score)   Activity Eval 09/22/23    1. Full AROM of knee 1     2. Improved knee stability  1    3. Return to jujitsu 1   4. stairs 1   5. walking 1   Score 1/10    Total score = sum of the activity scores/number of activities Minimum detectable change (90%CI) for average score = 2 points Minimum detectable change (90%CI) for single activity score = 3 points  COGNITION: Evaluation on 09/22/2023:   Overall cognitive status: WFL    SENSATION: Evaluation on 09/22/2023:   [facility]  EDEMA:  Evaluation on 09/22/2023:  Not measured but left knee is larger by observation.  She reports increased with activities.   POSTURE:  Evaluation on 09/22/2023:  No Significant postural  limitations  PALPATION: Evaluation on 09/22/2023:  Tender to surgical sites and quadricep  LOWER EXTREMITY ROM:   ROM Left Eval 09/22/2023 Left  09/30/2023 Left 10/26/23  Knee flexion Supine with strap AA: 84 Active at 101 A: 120 (After heelslides 122)  Knee extension Supine quad set A: -7 Active -1    (Blank rows = not tested)  LOWER EXTREMITY MMT:  Lt not tested at eval due to surgery  MMT Right Eval 09/22/23 Left Eval 09/22/23  Hip flexion 5 4  Hip extension    Hip abduction    Hip adduction    Hip internal rotation    Hip external rotation    Knee flexion 5 3-/5  Knee extension 5 3-/5  Ankle dorsiflexion 5 5  Ankle plantarflexion 5 as seen in gait   Ankle inversion    Ankle eversion     (Blank rows = not tested)  FUNCTIONAL TESTS:  Evaluation on 09/22/2023: Rises from 18in chair with BUE use on armrests and LLE slightly in front to decrease knee bending  GAIT: Evaluation on 09/22/2023: Distance walked: clinical Assistive device utilized:  Crutches Level of assistance: SBA Comments: crutches too short, PWB on LLE using 3pt gait, decreased cadence, decreased step length                                                                                                                                                                        TODAY'S TREATMENT 10/26/23 TherEx Recumbent bike L5 x 8 min; seat 5 AA heel slides 2x10; 5 sec hold  TherAct Leg press 62# 2x15; 47# 2x15 Sidestepping on foam x 5 laps Tandem walking on foam x 5 laps fwd/bwd  Modalities Vaso Left knee high pressure 34* X 10 min   10/19/23 TherEx Recumbent bike L3 x 8 min; seat 5  TherAct With BFR cuff size 4; 168 mmHg; leg press 56# x 30 reps; 75# 2x15; LLE only 31# x 30 reps; 2x15 LLE on 4" step with Rt heel taps 3x15  Modalities Vaso Left knee medium Pressure 34* X 10 min   10/12/23:  TherEx: Nu step L5 X 8 min UE/LE Supine heelslides AAROM with strap 5 sec X  10  Theractivity Step ups with BFR 168 mm Hg, 4 inch step light UE support, leading with left leg, reps of 30, then 6 inch step, light UE support reps of, 15, 15, 15 with 30 sec rest in between. Leg press BFR 168 mm Hg, DL 16# X 30 reps, then left leg only 31# reps of 15, 15, 15, with 30 sec rest in between all sets. Walking without crutch 100 feet X 2   Modalities:  Vaso Left knee medium Pressure 34* X 10 min    10/06/23:  TherEx: Nu step L5 X 8 min UE/LE   Theractivity Seated straight leg raises  with BFR 168 mm Hg, reps of 20, 15, 15, 15 with 30 sec rest in between. Leg press BFR 168 mm Hg, DL 10# X 30 reps, then left leg only 25# reps of 15, 15, 15, with 30 sec rest in between all sets. March walking at counter top with finger tip support to encourage more time and weight shift in stance for left leg as well as increased hip/knee flexion during swing phase Walking without crutch 100 feet X 2    Modalities:  Vaso Left knee medium Pressure 34* X 10 min   PATIENT EDUCATION:  Education details: HEP, POC Person educated: Patient Education method: Programmer, multimedia, Demonstration, Verbal cues, and Handouts Education comprehension: verbalized understanding, returned demonstration, and verbal cues required  HOME EXERCISE PROGRAM: Access Code: [email or web address] URL: [email or web address] Date: 09/30/2023 Prepared by: Terral Ferrari  Exercises - Ankle Alphabet in Elevation  - 2-3 x daily - 7 x weekly - 1 sets - 2-3 reps - elevate 10-15 min hold - Supine Active Straight  Leg Raise (Mirrored)  - 2-3 x daily - 7 x weekly - 2 sets - 10 reps - 5 seconds hold - supine quad set with towel roll under ankle  - 2-3 x daily - 7 x weekly - 2 sets - 10 reps - 5 seconds hold - Supine Heel Slide with Strap  - 2-3 x daily - 7 x weekly - 2 sets - 10 reps - 5 seconds hold - seated single straight leg raise   - 2-3 x daily - 7 x weekly - 2 sets - 10 reps - 5 seconds hold - Seated Quad Set  - 2-3 x  daily - 7 x weekly - 2 sets - 10 reps - 5 seconds hold - Seated Knee Flexion Extension AROM   - 2-3 x daily - 7 x weekly - 2 sets - 10 reps - 5 seconds hold - Prone Hip Extension  - 1 x daily - 7 x weekly - 2 sets - 10 reps - 3 seconds hold  ASSESSMENT:  CLINICAL IMPRESSION Pt with increased pain and swelling today so held on BFR today.  Tolerated session today well and utilized high compression vaso to help with swelling.  OBJECTIVE IMPAIRMENTS: Abnormal gait, decreased activity tolerance, decreased balance, decreased coordination, decreased endurance, decreased knowledge of condition, decreased knowledge of use of DME, decreased mobility, difficulty walking, decreased ROM, decreased strength, increased edema, increased muscle spasms, and pain.   ACTIVITY LIMITATIONS: carrying, lifting, bending, sitting, standing, squatting, stairs, and locomotion level  PARTICIPATION LIMITATIONS: meal prep, cleaning, laundry, driving, shopping, community activity, and occupation  PERSONAL FACTORS: Profession, Time since onset of injury/illness/exacerbation, and 1-2 comorbidities: see above  are also affecting patient's functional outcome.   REHAB POTENTIAL: Good  CLINICAL DECISION MAKING: Stable/uncomplicated  EVALUATION COMPLEXITY: Low   GOALS: Goals reviewed with patient? Yes  SHORT TERM GOALS: (target date for Short term goals are 3 weeks 10/13/2023)   1.  Patient will demonstrate independent use of home exercise program to maintain progress from in clinic treatments.  Goal status: MET 10/26/23  2.  Patient will demonstrate AROM 0-95deg   Goal status: MET 10/26/23   LONG TERM GOALS: (target dates for all long term goals are 10 weeks  12/01/2023 )   1. Patient will demonstrate/report pain at worst less than or equal to 2/10 to facilitate minimal limitation in daily activity secondary to pain symptoms.  Goal status: Partially Met 09/29/2023   2. Patient will demonstrate independent use of home  exercise program to facilitate ability to maintain/progress functional gains from skilled physical therapy services.  Goal status: Ongoing  09/30/2023   3. Patient will demonstrate Patient specific functional scale avg > or = 7 to indicate reduced disability due to condition.   Goal status: Ongoing  09/27/2023   4.  Patient will demonstrate LLE MMT 5/5 throughout to faciltiate usual transfers, stairs, squatting at [facility] for daily life.   Goal status: Ongoing  09/27/2023   5.  Patient will demonstrate/report ability to perform 18 inch chair transfer without UE assist.   Goal status: Ongoing  09/27/2023   6.  Patient will demonstrate up and down a flight of stairs with single hand rail with reciprocal gait pattern.  Goal status: Ongoing  09/27/2023   7.  patient will demonstrate AROM 0-120 for full range of motion necessary to perform job tasks Goal Status: Ongoing  09/27/2023   PLAN:  PT FREQUENCY: 3x/wk for 2 weeks, then 1-2x/week  PT DURATION: 10 weeks  PLANNED INTERVENTIONS: Can include 40981- PT Re-evaluation, 97110-Therapeutic exercises, 97530- Therapeutic activity, [id number]- Neuromuscular re-education, [phone]- Self Care, 97140- Manual therapy, [phone]- Gait training, [phone]- Orthotic Fit/training, [phone]- Canalith repositioning, [id number]- Aquatic Therapy, [phone]- Electrical stimulation (unattended), [phone]- Electrical stimulation (manual), [id number] Physical performance testing, 97016- Vasopneumatic device, [id number]- Ultrasound, [id number]- Traction (mechanical), [id number]- Ionotophoresis 4mg /ml Dexamethasone , Blood Flow Restriction Therapy,  Patient/Family education, Balance training, Stair training, Taping, Dry Needling, Joint mobilization, Joint manipulation, Spinal manipulation, Spinal mobilization, Scar mobilization, Vestibular training, Visual/preceptual remediation/compensation, DME instructions, Cryotherapy, and Moist heat.  All performed as medically necessary.  All included unless contraindicated  PLAN FOR NEXT  SESSION:  Continue Use BFR (release BFR between doing another exercise), quadriceps and appropriate hamstrings/hip strengthening, vaso as needed. Normalize gait   Next MD visit: 11/09/2023   Marley Simmers, PT, DPT 10/26/23 3:59 PM

## 2023-10-31 ENCOUNTER — Encounter: Payer: Self-pay | Admitting: Family Medicine

## 2023-10-31 DIAGNOSIS — F341 Dysthymic disorder: Secondary | ICD-10-CM

## 2023-11-01 ENCOUNTER — Other Ambulatory Visit (HOSPITAL_COMMUNITY): Payer: Self-pay

## 2023-11-01 MED ORDER — ESCITALOPRAM OXALATE 20 MG PO TABS
20.0000 mg | ORAL_TABLET | Freq: Every day | ORAL | 1 refills | Status: DC
Start: 2023-11-01 — End: 2023-12-26
  Filled 2023-11-01: qty 30, 30d supply, fill #0
  Filled 2023-11-25: qty 30, 30d supply, fill #1

## 2023-11-02 ENCOUNTER — Encounter: Payer: Self-pay | Admitting: Physical Therapy

## 2023-11-02 ENCOUNTER — Ambulatory Visit: Admitting: Physical Therapy

## 2023-11-02 DIAGNOSIS — M6281 Muscle weakness (generalized): Secondary | ICD-10-CM | POA: Diagnosis not present

## 2023-11-02 DIAGNOSIS — R2689 Other abnormalities of gait and mobility: Secondary | ICD-10-CM | POA: Diagnosis not present

## 2023-11-02 DIAGNOSIS — G8929 Other chronic pain: Secondary | ICD-10-CM | POA: Diagnosis not present

## 2023-11-02 DIAGNOSIS — R600 Localized edema: Secondary | ICD-10-CM

## 2023-11-02 DIAGNOSIS — M25562 Pain in left knee: Secondary | ICD-10-CM | POA: Diagnosis not present

## 2023-11-02 DIAGNOSIS — M25662 Stiffness of left knee, not elsewhere classified: Secondary | ICD-10-CM

## 2023-11-02 NOTE — Therapy (Signed)
 OUTPATIENT PHYSICAL THERAPY LOWER EXTREMITY TREATMENT   Patient Name: Jasmine Ortega MRN: [id number] DOB:January 04, 1981, 43 y.o., female Today's Date: 11/02/2023  END OF SESSION:  PT End of Session - 11/02/23 0802     Visit Number 11    Number of Visits 20    Date for PT Re-Evaluation 12/01/23    Authorization Type [employer]    PT Start Time 0800    PT Stop Time 0850    PT Time Calculation (min) 50 min    Activity Tolerance Patient tolerated treatment well;No increased pain    Behavior During Therapy WFL for tasks assessed/performed                   Past Medical History:  Diagnosis Date   Birth control    Chicken pox    Depression    GERD (gastroesophageal reflux disease)    Gestational diabetes    Past Surgical History:  Procedure Laterality Date   ALLOGRAFT APPLICATION Left 09/13/2023   Procedure: BONE -PATELLA-TENDON-BONE ALLOGRAFT;  Surgeon: Jasmine Mesi, MD;  Location: [facility] OR;  Service: Orthopedics;  Laterality: Left;   ANTERIOR CRUCIATE LIGAMENT REPAIR Left 09/13/2023   Procedure: LEFT KNEE ACL TEAR RECONSTRUCTION;  Surgeon: Jasmine Mesi, MD;  Location: [facility] OR;  Service: Orthopedics;  Laterality: Left;   BONE MARROW ASPIRATION FOR SPINE FUSION ONLY (BMAC) Left 09/13/2023   Procedure: BONE MARROW ASPIRATE CONCENTRATE FROM ILLIAC CREST;  Surgeon: Jasmine Mesi, MD;  Location: [facility] OR;  Service: Orthopedics;  Laterality: Left;   KNEE ARTHROSCOPY WITH MENISCAL REPAIR Left 09/13/2023   Procedure: MEDIAL MENISCAL TEAR REPAIR;  Surgeon: Jasmine Mesi, MD;  Location: [facility] OR;  Service: Orthopedics;  Laterality: Left;   LASIK Bilateral    NO PAST SURGERIES     Patient Active Problem List   Diagnosis Date Noted   Left anterior cruciate ligament tear 09/18/2023   Acute medial meniscus tear of left knee 09/18/2023   Pain in left knee 08/03/2023   Chronic low back pain without sciatica 09/23/2021   Dysthymia 09/23/2021   Obesity (BMI 30-39.9)  09/14/2019   Hyperlipidemia 09/14/2019   Gastroesophageal reflux disease 04/27/2017   Attention deficit hyperactivity disorder (ADHD), predominantly inattentive type 03/05/2015   Loss of transverse plantar arch of right foot 09/18/2014    PCP: Watson Hacking, MD  REFERRING PROVIDER: Jasmine Mesi, MD  REFERRING DIAG:  [phone] (ICD-10-CM) - Rupture of anterior cruciate ligament of left knee, initial encounter S83.242D (ICD-10-CM) - Other tear of medial meniscus, current injury, left knee, subsequent encounter  THERAPY DIAG:  Muscle weakness (generalized)  Chronic pain of left knee  Stiffness of left knee, not elsewhere classified  Other abnormalities of gait and mobility  Localized edema  Rationale for Evaluation and Treatment: Rehabilitation  ONSET DATE: 09/13/2023 s/p Lt ACL with medial meniscus  SUBJECTIVE:   SUBJECTIVE STATEMENT: Doing pretty well today; having some sharp anterior knee pain with increase flexion.   PERTINENT HISTORY: 3/18 surgery with Left LEFT KNEE ACL TEAR RECONSTRUCTION , BONE MARROW ASPIRATE CONCENTRATE, Left MEDIAL MENISCAL TEAR REPAIR, Left BONE -PATELLA-TENDON-BONE ALLOGRAFT Chronic LBP, HLD, GERD, ADHD  No hx of DVT- ok to do BFR  PAIN:  NPRS scale: 3/10 today  Pain location: Lt knee Pain description: burning, numbness Aggravating factors: too much WB, prolonged postures Relieving factors: ice, rest, elevation, pain meds- ibprophin, roboxin at bed time  PRECAUTIONS: Other:  ROM & strengthening closed chain; ok to WBAT in 7  days  WEIGHT BEARING RESTRICTIONS: "ok to WBAT in 7 days" per referral 09/28/2023. However will need clarification.   FALLS:  Has patient fallen in last 6 months? 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 No displaced fragment. Lateral meniscus:  Intact.   LIGAMENTS Cruciates:  The ACL is chronically torn.  The PCL is intact. Collaterals:  Intact.  CARTILAGE Patellofemoral:  Preserved. Medial:  Minimally degenerated. Lateral:  Minimally degenerated. Joint:  Small to moderate joint effusion. Popliteal Fossa:  No Baker's cyst. Extensor Mechanism:  Intact.   Bones: No fracture, stress change or worrisome lesion. Small enchondroma distal femur noted   Other: Subcutaneous edema anterior to the patella and patellar tendon compatible bursitis is seen.   IMPRESSION: 1. Longitudinal tear posterior horn and posterior body of the medial meniscus without displaced fragment. 2. Chronic ACL tear. 3. Mild medial and lateral compartment osteoarthritis. 4. Prepatellar bursitis.    PATIENT SURVEYS:  Patient-Specific Activity Scoring Scheme  "0" represents "unable to perform." "10" represents "able to perform at prior level. 0 1 2 3 4 5 6 7 8 9  10 (Date and Score)   Activity Eval 09/22/23    1. Full AROM of knee 1     2. Improved knee stability  1    3. Return to jujitsu 1   4. stairs 1   5. walking 1   Score 1/10    Total score = sum of the activity scores/number of activities Minimum detectable change (90%CI) for average score = 2 points Minimum detectable change (90%CI) for single activity score = 3 points  COGNITION: Evaluation on 09/22/2023:   Overall cognitive status: WFL    SENSATION: Evaluation on 09/22/2023:   [facility]  EDEMA:  Evaluation on 09/22/2023:  Not measured but left knee is larger by observation.  She reports increased with activities.   POSTURE:   Evaluation on 09/22/2023:  No Significant postural limitations  PALPATION: Evaluation on 09/22/2023:  Tender to surgical sites and quadricep  LOWER EXTREMITY ROM:   ROM Left Eval 09/22/2023 Left  09/30/2023 Left 10/26/23  Knee flexion Supine with strap AA: 84 Active at 101 A: 120 (After heelslides 122)  Knee extension Supine quad set A: -7 Active -1    (Blank rows = not tested)  LOWER EXTREMITY MMT:  Lt not tested at eval due to surgery  MMT Right Eval 09/22/23 Left Eval 09/22/23  Hip flexion 5 4  Hip extension    Hip abduction    Hip adduction    Hip internal rotation    Hip external rotation    Knee flexion 5 3-/5  Knee extension 5 3-/5  Ankle dorsiflexion 5 5  Ankle plantarflexion 5 as seen in gait   Ankle inversion    Ankle eversion     (Blank rows = not tested)  FUNCTIONAL TESTS:  Evaluation on 09/22/2023: Rises from 18in chair with BUE use on armrests and LLE slightly in front to decrease knee bending  GAIT: Evaluation on 09/22/2023: Distance walked: clinical Assistive device utilized: Crutches Level of assistance: SBA Comments: crutches too short, PWB on LLE using 3pt gait, decreased cadence, decreased step length                                                                                                                                                                        TODAY'S TREATMENT 11/01/23 TherEx Recumbent bike L5 x 8 min; seat 5  TherAct With BFR (cuff size 4; 168 mmHg) leg press 62# 3x20; 30 sec rest between sets SLS with BFR and RLE taps to Blaze Pods; 3 pods with 5 cycles 30 sec on/30 sec off with intermittent UE support needed   Modalities Vaso Left knee mod pressure 34* X 10 min   10/26/23 TherEx Recumbent bike L5 x 8 min; seat 5 AA heel slides 2x10; 5 sec hold  TherAct Leg press 62# 2x15; 47# 2x15 Sidestepping on foam x 5 laps Tandem walking on foam x 5 laps fwd/bwd  Modalities Vaso Left knee high pressure 34* X 10  min   10/19/23 TherEx Recumbent bike L3 x 8 min; seat 5  TherAct With BFR cuff size 4; 168 mmHg; leg press 56# x 30 reps; 75# 2x15; LLE only 31# x 30 reps; 2x15 LLE on 4" step with Rt heel taps 3x15  Modalities Vaso Left knee medium Pressure 34* X 10 min   10/12/23:  TherEx: Nu step L5 X 8 min UE/LE Supine heelslides AAROM with strap 5 sec X 10  Theractivity Step ups with BFR 168 mm Hg, 4 inch step light UE support, leading with left leg, reps of 30, then 6 inch step, light UE support reps of, 15, 15, 15 with 30 sec rest in between. Leg press BFR 168 mm Hg, DL 40# X 30 reps, then left leg only 31# reps of 15, 15, 15, with 30 sec rest in between all sets. Walking without crutch 100 feet X 2   Modalities:  Vaso Left knee medium Pressure 34* X 10 min    10/06/23:  TherEx: Nu step L5 X 8 min UE/LE   Theractivity Seated straight leg raises  with BFR 168 mm Hg, reps of 20, 15, 15, 15 with 30 sec rest in between. Leg press BFR 168 mm Hg, DL 98# X 30 reps, then left leg only 25# reps of 15, 15, 15, with 30 sec rest in between all sets. March walking at counter top with finger tip support to encourage more time and weight shift in stance for left leg as well as increased hip/knee flexion during swing phase Walking without crutch 100 feet X 2    Modalities:  Vaso Left knee medium Pressure 34* X 10 min  PATIENT EDUCATION:  Education details: HEP, POC Person educated: Patient Education method: Programmer, multimedia, Demonstration, Verbal cues, and Handouts Education comprehension: verbalized understanding, returned demonstration, and verbal cues required  HOME EXERCISE PROGRAM: Access Code: [email or web address] URL: [email or web address] Date: 09/30/2023 Prepared by: Terral Ferrari  Exercises - Ankle Alphabet in Elevation  - 2-3 x daily - 7 x weekly - 1 sets - 2-3 reps - elevate 10-15 min hold - Supine Active Straight Leg Raise (Mirrored)  - 2-3 x daily - 7 x weekly - 2 sets - 10  reps - 5 seconds hold - supine quad set with towel roll under ankle  - 2-3 x daily - 7 x weekly - 2 sets - 10 reps - 5 seconds hold - Supine Heel Slide with Strap  - 2-3 x daily - 7 x weekly - 2 sets - 10 reps - 5 seconds hold - seated single straight leg raise   - 2-3 x daily - 7 x weekly - 2 sets - 10 reps - 5 seconds hold - Seated Quad Set  - 2-3 x daily - 7 x weekly - 2 sets - 10 reps - 5 seconds hold - Seated Knee Flexion Extension AROM   - 2-3 x daily - 7 x weekly - 2 sets - 10 reps - 5 seconds hold - Prone Hip Extension  - 1 x daily - 7 x weekly - 2 sets - 10 reps - 3 seconds hold  ASSESSMENT:  CLINICAL IMPRESSION Continued to work on BFR with strengthening and balance activities today.  Tolerated session well without concerns.  Continue skilled PT.   OBJECTIVE IMPAIRMENTS: Abnormal gait, decreased activity tolerance, decreased balance, decreased coordination, decreased endurance, decreased knowledge of condition, decreased knowledge of use of DME, decreased mobility, difficulty walking, decreased ROM, decreased strength, increased edema, increased muscle spasms, and pain.   ACTIVITY LIMITATIONS: carrying, lifting, bending, sitting, standing, squatting, stairs, and locomotion level  PARTICIPATION LIMITATIONS: meal prep, cleaning, laundry, driving, shopping, community activity, and occupation  PERSONAL FACTORS: Profession, Time since onset of injury/illness/exacerbation, and 1-2 comorbidities: see above  are also affecting patient's functional outcome.   REHAB POTENTIAL: Good  CLINICAL DECISION MAKING: Stable/uncomplicated  EVALUATION COMPLEXITY: Low   GOALS: Goals reviewed with patient? Yes  SHORT TERM GOALS: (target date for Short term goals are 3 weeks 10/13/2023)   1.  Patient will demonstrate independent use of home exercise program to maintain progress from in clinic treatments.  Goal status: MET 10/26/23  2.  Patient will demonstrate AROM 0-95deg   Goal status: MET  10/26/23   LONG TERM GOALS: (target dates for all long term goals are 10 weeks  12/01/2023 )   1. Patient will demonstrate/report pain at worst less than or equal to 2/10 to facilitate minimal limitation in daily activity secondary to pain symptoms.  Goal status: Partially Met 09/29/2023   2. Patient will demonstrate independent use of home exercise program to facilitate ability to maintain/progress functional gains from skilled physical therapy services.  Goal status: Ongoing  09/30/2023   3. Patient will demonstrate Patient specific functional scale avg > or = 7 to indicate reduced disability due to condition.   Goal status: Ongoing  09/27/2023   4.  Patient will demonstrate LLE MMT 5/5 throughout to faciltiate usual transfers, stairs, squatting at [facility] for daily life.   Goal status: Ongoing  09/27/2023   5.  Patient will demonstrate/report ability to perform 18 inch chair transfer without UE assist.   Goal status: Ongoing  09/27/2023  6.  Patient will demonstrate up and down a flight of stairs with single hand rail with reciprocal gait pattern.  Goal status: Ongoing  09/27/2023   7.  patient will demonstrate AROM 0-120 for full range of motion necessary to perform job tasks Goal Status: Ongoing  09/27/2023   PLAN:  PT FREQUENCY: 3x/wk for 2 weeks, then 1-2x/week  PT DURATION: 10 weeks  PLANNED INTERVENTIONS: Can include 64403- PT Re-evaluation, 97110-Therapeutic exercises, 97530- Therapeutic activity, 97112- Neuromuscular re-education, 97535- Self Care, 97140- Manual therapy, [phone]- Gait training, [phone]- Orthotic Fit/training, [phone]- Canalith repositioning, [id number]- Aquatic Therapy, [phone]- Electrical stimulation (unattended), [phone]- Electrical stimulation (manual), [id number] Physical performance testing, 97016- Vasopneumatic device, [id number]- Ultrasound, [id number]- Traction (mechanical), [id number]- Ionotophoresis 4mg /ml Dexamethasone , Blood Flow Restriction Therapy,  Patient/Family education, Balance  training, Stair training, Taping, Dry Needling, Joint mobilization, Joint manipulation, Spinal manipulation, Spinal mobilization, Scar mobilization, Vestibular training, Visual/preceptual remediation/compensation, DME instructions, Cryotherapy, and Moist heat.  All performed as medically necessary.  All included unless contraindicated  PLAN FOR NEXT SESSION:  Work on closed chain strengthening Continue Use BFR (release BFR between doing another exercise), quadriceps and appropriate hamstrings/hip strengthening, vaso as needed. Normalize gait   Next MD visit: 11/09/2023   Marley Simmers, PT, DPT 11/02/23 11:06 AM

## 2023-11-09 ENCOUNTER — Ambulatory Visit (INDEPENDENT_AMBULATORY_CARE_PROVIDER_SITE_OTHER): Admitting: Orthopedic Surgery

## 2023-11-09 ENCOUNTER — Encounter: Payer: Self-pay | Admitting: Physical Therapy

## 2023-11-09 ENCOUNTER — Ambulatory Visit: Admitting: Physical Therapy

## 2023-11-09 DIAGNOSIS — M62838 Other muscle spasm: Secondary | ICD-10-CM | POA: Diagnosis not present

## 2023-11-09 DIAGNOSIS — M25662 Stiffness of left knee, not elsewhere classified: Secondary | ICD-10-CM | POA: Diagnosis not present

## 2023-11-09 DIAGNOSIS — R293 Abnormal posture: Secondary | ICD-10-CM | POA: Diagnosis not present

## 2023-11-09 DIAGNOSIS — R2689 Other abnormalities of gait and mobility: Secondary | ICD-10-CM

## 2023-11-09 DIAGNOSIS — R600 Localized edema: Secondary | ICD-10-CM

## 2023-11-09 DIAGNOSIS — M25562 Pain in left knee: Secondary | ICD-10-CM

## 2023-11-09 DIAGNOSIS — M6281 Muscle weakness (generalized): Secondary | ICD-10-CM | POA: Diagnosis not present

## 2023-11-09 DIAGNOSIS — G8929 Other chronic pain: Secondary | ICD-10-CM

## 2023-11-09 DIAGNOSIS — Z9889 Other specified postprocedural states: Secondary | ICD-10-CM

## 2023-11-09 NOTE — Therapy (Signed)
 OUTPATIENT PHYSICAL THERAPY LOWER EXTREMITY TREATMENT   Patient Name: Jasmine Ortega MRN: [id number] DOB:1981-05-26, 43 y.o., female Today's Date: 11/09/2023  END OF SESSION:  PT End of Session - 11/09/23 0856     Visit Number 12    Number of Visits 20    Date for PT Re-Evaluation 12/01/23    Authorization Type [employer]    Progress Note Due on Visit 20    PT Start Time 0801    PT Stop Time [phone]    PT Time Calculation (min) 45 min    Activity Tolerance Patient tolerated treatment well;No increased pain    Behavior During Therapy WFL for tasks assessed/performed                    Past Medical History:  Diagnosis Date   Birth control    Chicken pox    Depression    GERD (gastroesophageal reflux disease)    Gestational diabetes    Past Surgical History:  Procedure Laterality Date   ALLOGRAFT APPLICATION Left 09/13/2023   Procedure: BONE -PATELLA-TENDON-BONE ALLOGRAFT;  Surgeon: Jasmine Mesi, MD;  Location: [facility] OR;  Service: Orthopedics;  Laterality: Left;   ANTERIOR CRUCIATE LIGAMENT REPAIR Left 09/13/2023   Procedure: LEFT KNEE ACL TEAR RECONSTRUCTION;  Surgeon: Jasmine Mesi, MD;  Location: [facility] OR;  Service: Orthopedics;  Laterality: Left;   BONE MARROW ASPIRATION FOR SPINE FUSION ONLY (BMAC) Left 09/13/2023   Procedure: BONE MARROW ASPIRATE CONCENTRATE FROM ILLIAC CREST;  Surgeon: Jasmine Mesi, MD;  Location: [facility] OR;  Service: Orthopedics;  Laterality: Left;   KNEE ARTHROSCOPY WITH MENISCAL REPAIR Left 09/13/2023   Procedure: MEDIAL MENISCAL TEAR REPAIR;  Surgeon: Jasmine Mesi, MD;  Location: [facility] OR;  Service: Orthopedics;  Laterality: Left;   LASIK Bilateral    NO PAST SURGERIES     Patient Active Problem List   Diagnosis Date Noted   Left anterior cruciate ligament tear 09/18/2023   Acute medial meniscus tear of left knee 09/18/2023   Pain in left knee 08/03/2023   Chronic low back pain without sciatica 09/23/2021   Dysthymia  09/23/2021   Obesity (BMI 30-39.9) 09/14/2019   Hyperlipidemia 09/14/2019   Gastroesophageal reflux disease 04/27/2017   Attention deficit hyperactivity disorder (ADHD), predominantly inattentive type 03/05/2015   Loss of transverse plantar arch of right foot 09/18/2014    PCP: Watson Hacking, MD  REFERRING PROVIDER: Jasmine Mesi, MD  REFERRING DIAG:  [phone] (ICD-10-CM) - Rupture of anterior cruciate ligament of left knee, initial encounter S83.242D (ICD-10-CM) - Other tear of medial meniscus, current injury, left knee, subsequent encounter  THERAPY DIAG:  Muscle weakness (generalized)  Chronic pain of left knee  Stiffness of left knee, not elsewhere classified  Other abnormalities of gait and mobility  Localized edema  Abnormal posture  Other muscle spasm  Rationale for Evaluation and Treatment: Rehabilitation  ONSET DATE: 09/13/2023 s/p Lt ACL with medial meniscus  SUBJECTIVE:   SUBJECTIVE STATEMENT: Pt reporting she has been going to gym and riding the recumbent bike.    PERTINENT HISTORY: 3/18 surgery with Left LEFT KNEE ACL TEAR RECONSTRUCTION , BONE MARROW ASPIRATE CONCENTRATE, Left MEDIAL MENISCAL TEAR REPAIR, Left BONE -PATELLA-TENDON-BONE ALLOGRAFT Chronic LBP, HLD, GERD, ADHD  No hx of DVT- ok to do BFR  PAIN:  NPRS scale: 1/10 today  Pain location: Lt knee Pain description: burning, numbness Aggravating factors: too much WB, prolonged postures Relieving factors: ice, rest, elevation, pain meds- ibprophin,  roboxin at bed time  PRECAUTIONS: Other:  ROM & strengthening closed chain; ok to WBAT in 7 days  WEIGHT BEARING RESTRICTIONS: "ok to WBAT in 7 days" per referral 09/28/2023. However will need clarification.   FALLS:  Has patient fallen in last 6 months? 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 No displaced fragment. Lateral meniscus:  Intact.   LIGAMENTS Cruciates:  The ACL is chronically torn.  The PCL is intact. Collaterals:  Intact.  CARTILAGE Patellofemoral:  Preserved. Medial:  Minimally degenerated. Lateral:  Minimally degenerated. Joint:  Small to moderate joint effusion. Popliteal Fossa:  No Baker's cyst. Extensor Mechanism:  Intact.   Bones: No fracture, stress change or worrisome lesion. Small enchondroma distal femur noted   Other: Subcutaneous edema anterior to the patella and patellar tendon compatible bursitis is seen.   IMPRESSION: 1. Longitudinal tear posterior horn and posterior body of the medial meniscus without displaced fragment. 2. Chronic ACL tear. 3. Mild medial and lateral compartment osteoarthritis. 4. Prepatellar bursitis.    PATIENT SURVEYS:  Patient-Specific Activity Scoring Scheme  "0" represents "unable to perform." "10" represents "able to perform at prior level. 0 1 2 3 4 5 6 7 8 9  10 (Date and Score)   Activity Eval 09/22/23 11/09/23   1. Full AROM of knee 1   6  2. Improved knee stability  1  7  3. Return to jujitsu 1 0  4. stairs 1 1  5. walking 1 9  Score 1/10 4.6   Total score = sum of the activity scores/number of activities Minimum detectable change (90%CI) for average score = 2 points Minimum detectable change (90%CI) for single activity score = 3 points  COGNITION: Evaluation on 09/22/2023:   Overall cognitive status: WFL    SENSATION: Evaluation on 09/22/2023:   [facility]  EDEMA:  Evaluation on 09/22/2023:  Not measured but left knee is  larger by observation.  She reports increased with activities.   POSTURE:  Evaluation on 09/22/2023:  No Significant postural limitations  PALPATION: Evaluation on 09/22/2023:  Tender to surgical sites and quadricep  LOWER EXTREMITY ROM:   ROM Left Eval 09/22/2023 Left  09/30/2023 Left 10/26/23  Knee flexion Supine with strap AA: 84 Active at 101 A: 120 (After heelslides 122)  Knee extension Supine quad set A: -7 Active -1    (Blank rows = not tested)  LOWER EXTREMITY MMT:  Lt not tested at eval due to surgery  MMT Right Eval 09/22/23 Left Eval 09/22/23 Left 11/09/23 HHD  Hip flexion 5 4   Hip extension     Hip abduction     Hip adduction     Hip internal rotation     Hip external rotation     Knee flexion 5 3-/5 21.2 ppsi  Knee extension 5 3-/5 42.1 ppsi  Ankle dorsiflexion 5 5   Ankle plantarflexion 5 as seen in gait    Ankle inversion     Ankle eversion      (  Blank rows = not tested)  FUNCTIONAL TESTS:  Evaluation on 09/22/2023: Rises from 18in chair with BUE use on armrests and LLE slightly in front to decrease knee bending  GAIT: Evaluation on 09/22/2023: Distance walked: clinical Assistive device utilized: Crutches Level of assistance: SBA Comments: crutches too short, PWB on LLE using 3pt gait, decreased cadence, decreased step length                                                                                                                                                                        TODAY'S TREATMENT 11/09/23 TherEx Recumbent bike L5 x 6 min; seat 5 TherAct BFR (cuff size 4; 168 mmHg)  leg press 62# 3 x 20; 30 sec rest between sets Step ups 30 sec x 4  (once on 4 inch step, 3 sets on 6 inch step) TRX mini squats 3 x 15 (30 sec rest between each set)  Modalities Vaso Left knee mod pressure 34* X 10 min      11/01/23 TherEx Recumbent bike L5 x 8 min; seat 5  TherAct With BFR (cuff size 4; 168 mmHg) leg press 62# 3x20; 30 sec  rest between sets SLS with BFR and RLE taps to Blaze Pods; 3 pods with 5 cycles 30 sec on/30 sec off with intermittent UE support needed   Modalities Vaso Left knee mod pressure 34* X 10 min     10/26/23 TherEx Recumbent bike L5 x 8 min; seat 5 AA heel slides 2x10; 5 sec hold  TherAct Leg press 62# 2x15; 47# 2x15 Sidestepping on foam x 5 laps Tandem walking on foam x 5 laps fwd/bwd  Modalities Vaso Left knee high pressure 34* X 10 min   10/19/23 TherEx Recumbent bike L3 x 8 min; seat 5  TherAct With BFR cuff size 4; 168 mmHg; leg press 56# x 30 reps; 75# 2x15; LLE only 31# x 30 reps; 2x15 LLE on 4" step with Rt heel taps 3x15  Modalities Vaso Left knee medium Pressure 34* X 10 min   10/12/23:  TherEx: Nu step L5 X 8 min UE/LE Supine heelslides AAROM with strap 5 sec X 10  Theractivity Step ups with BFR 168 mm Hg, 4 inch step light UE support, leading with left leg, reps of 30, then 6 inch step, light UE support reps of, 15, 15, 15 with 30 sec rest in between. Leg press BFR 168 mm Hg, DL 29# X 30 reps, then left leg only 31# reps of 15, 15, 15, with 30 sec rest in between all sets. Walking without crutch 100 feet X 2   Modalities:  Vaso Left knee medium Pressure 34* X 10 min    10/06/23:  TherEx: Nu step L5 X  8 min UE/LE   Theractivity Seated straight leg raises  with BFR 168 mm Hg, reps of 20, 15, 15, 15 with 30 sec rest in between. Leg press BFR 168 mm Hg, DL 09# X 30 reps, then left leg only 25# reps of 15, 15, 15, with 30 sec rest in between all sets. March walking at counter top with finger tip support to encourage more time and weight shift in stance for left leg as well as increased hip/knee flexion during swing phase Walking without crutch 100 feet X 2    Modalities:  Vaso Left knee medium Pressure 34* X 10 min   PATIENT EDUCATION:  Education details: HEP, POC Person educated: Patient Education method: Programmer, multimedia, Demonstration, Verbal cues, and  Handouts Education comprehension: verbalized understanding, returned demonstration, and verbal cues required  HOME EXERCISE PROGRAM: Access Code: [email or web address] URL: [email or web address] Date: 09/30/2023 Prepared by: Terral Ferrari  Exercises - Ankle Alphabet in Elevation  - 2-3 x daily - 7 x weekly - 1 sets - 2-3 reps - elevate 10-15 min hold - Supine Active Straight Leg Raise (Mirrored)  - 2-3 x daily - 7 x weekly - 2 sets - 10 reps - 5 seconds hold - supine quad set with towel roll under ankle  - 2-3 x daily - 7 x weekly - 2 sets - 10 reps - 5 seconds hold - Supine Heel Slide with Strap  - 2-3 x daily - 7 x weekly - 2 sets - 10 reps - 5 seconds hold - seated single straight leg raise   - 2-3 x daily - 7 x weekly - 2 sets - 10 reps - 5 seconds hold - Seated Quad Set  - 2-3 x daily - 7 x weekly - 2 sets - 10 reps - 5 seconds hold - Seated Knee Flexion Extension AROM   - 2-3 x daily - 7 x weekly - 2 sets - 10 reps - 5 seconds hold - Prone Hip Extension  - 1 x daily - 7 x weekly - 2 sets - 10 reps - 3 seconds hold  ASSESSMENT:  CLINICAL IMPRESSION Pt has currently met 5 of her 7 LTG's set at her initial evaluation. Pt's current active arch of motion is 0-120 degrees in her left knee. Pt tolerating BFR for strengthening. Recommending continued skilled PT interventions.      OBJECTIVE IMPAIRMENTS: Abnormal gait, decreased activity tolerance, decreased balance, decreased coordination, decreased endurance, decreased knowledge of condition, decreased knowledge of use of DME, decreased mobility, difficulty walking, decreased ROM, decreased strength, increased edema, increased muscle spasms, and pain.   ACTIVITY LIMITATIONS: carrying, lifting, bending, sitting, standing, squatting, stairs, and locomotion level  PARTICIPATION LIMITATIONS: meal prep, cleaning, laundry, driving, shopping, community activity, and occupation  PERSONAL FACTORS: Profession, Time since onset of  injury/illness/exacerbation, and 1-2 comorbidities: see above are also affecting patient's functional outcome.   REHAB POTENTIAL: Good  CLINICAL DECISION MAKING: Stable/uncomplicated  EVALUATION COMPLEXITY: Low   GOALS: Goals reviewed with patient? Yes  SHORT TERM GOALS: (target date for Short term goals are 3 weeks 10/13/2023)   1.  Patient will demonstrate independent use of home exercise program to maintain progress from in clinic treatments.  Goal status: MET 10/26/23  2.  Patient will demonstrate AROM 0-95deg   Goal status: MET 10/26/23   LONG TERM GOALS: (target dates for all long term goals are 10 weeks  12/01/2023 )   1. Patient will demonstrate/report pain at worst less than or equal to 2/10 to  facilitate minimal limitation in daily activity secondary to pain symptoms.  Goal status: Met 11/09/2023   2. Patient will demonstrate independent use of home exercise program to facilitate ability to maintain/progress functional gains from skilled physical therapy services.  Goal status: Ongoing  11/09/2023   3. Patient will demonstrate Patient specific functional scale avg > or = 7 to indicate reduced disability due to condition.   Goal status: Ongoing  11/09/23   4.  Patient will demonstrate LLE MMT 5/5 throughout to faciltiate usual transfers, stairs, squatting at [facility] for daily life.   Goal status: Ongoing  11/09/2023   5.  Patient will demonstrate/report ability to perform 18 inch chair transfer without UE assist.   Goal status: MET 11/09/2023   6.  Patient will demonstrate up and down a flight of stairs with single hand rail with reciprocal gait pattern.  Goal status: Ongoing  09/27/2023   7.  patient will demonstrate AROM 0-120 for full range of motion necessary to perform job tasks Goal Status: MET 11/09/23  PLAN:  PT FREQUENCY: 3x/wk for 2 weeks, then 1-2x/week  PT DURATION: 10 weeks  PLANNED INTERVENTIONS: Can include 40981- PT Re-evaluation, 97110-Therapeutic  exercises, 97530- Therapeutic activity, 97112- Neuromuscular re-education, 97535- Self Care, 97140- Manual therapy, [phone]- Gait training, [phone]- Orthotic Fit/training, [phone]- Canalith repositioning, [id number]- Aquatic Therapy, [phone]- Electrical stimulation (unattended), [phone]- Electrical stimulation (manual), [id number] Physical performance testing, 97016- Vasopneumatic device, [id number]- Ultrasound, [id number]- Traction (mechanical), [id number]- Ionotophoresis 4mg /ml Dexamethasone , Blood Flow Restriction Therapy,  Patient/Family education, Balance training, Stair training, Taping, Dry Needling, Joint mobilization, Joint manipulation, Spinal manipulation, Spinal mobilization, Scar mobilization, Vestibular training, Visual/preceptual remediation/compensation, DME instructions, Cryotherapy, and Moist heat.  All performed as medically necessary.  All included unless contraindicated  PLAN FOR NEXT SESSION:  Work on closed chain strengthening Continue Use BFR , quadriceps and appropriate hamstrings/hip strengthening, vaso as needed. Normalize gait Stair training progression to step over step   Next MD visit: 11/09/2023   Jerrel Mor, PT, MPT 11/09/23 8:59 AM   11/09/23 8:59 AM

## 2023-11-13 ENCOUNTER — Encounter: Payer: Self-pay | Admitting: Orthopedic Surgery

## 2023-11-13 NOTE — Progress Notes (Signed)
 Post-Op Visit Note   Patient: Jasmine Ortega           Date of Birth: 1980/09/09           MRN: [id number] Visit Date: 11/09/2023 PCP: Watson Hacking, MD   Assessment & Plan:  Chief Complaint:  Chief Complaint  Patient presents with   Left Knee - Routine Post Op      09/13/2023  left knee ACL reconstruction with bone patella tendon bone allograft with the MAC and medial meniscal debridement      Visit Diagnoses:  1. S/P ACL reconstruction     Plan: Patient underwent left knee ACL reconstruction 09/13/2023.  She is about 2 months out.  She is doing squats and step ups and physical therapy.  Does have a little bit of limping after a long day.  Not taking any medication for her symptoms.  Sleeping well.  Doing stationary bike.  On examination she has excellent range of motion and improving strength.  Graft is stable with 2 and half millimeter Lachman with good endpoint.  Pretty comparable to her right-hand side.  Plan at this time is avoid open chain quad strengthening exercises but continue with close chain strengthening.  No real cutting or pivoting yet.  2 months return for clinical recheck and advancement of physical therapy.  Follow-Up Instructions: No follow-ups on file.   Orders:  No orders of the defined types were placed in this encounter.  No orders of the defined types were placed in this encounter.   Imaging: No results found.  PMFS History: Patient Active Problem List   Diagnosis Date Noted   Left anterior cruciate ligament tear 09/18/2023   Acute medial meniscus tear of left knee 09/18/2023   Pain in left knee 08/03/2023   Chronic low back pain without sciatica 09/23/2021   Dysthymia 09/23/2021   Obesity (BMI 30-39.9) 09/14/2019   Hyperlipidemia 09/14/2019   Gastroesophageal reflux disease 04/27/2017   Attention deficit hyperactivity disorder (ADHD), predominantly inattentive type 03/05/2015   Loss of transverse plantar arch of right foot 09/18/2014   Past  Medical History:  Diagnosis Date   Birth control    Chicken pox    Depression    GERD (gastroesophageal reflux disease)    Gestational diabetes     Family History  Problem Relation Age of Onset   Breast cancer Mother    Cancer Mother 49       Breast cancer   Neuropathy Father    Arthritis Father    Hyperlipidemia Father    Hypertension Father    Stroke Father    Migraines Sister    Mitral valve prolapse Sister    Stroke Maternal Grandmother    Miscarriages / Stillbirths Maternal Grandfather    Arthritis Paternal Grandmother    Heart disease Paternal Grandmother    Diabetes Paternal Grandfather     Past Surgical History:  Procedure Laterality Date   ALLOGRAFT APPLICATION Left 09/13/2023   Procedure: BONE -PATELLA-TENDON-BONE ALLOGRAFT;  Surgeon: Jasmine Mesi, MD;  Location: [facility] OR;  Service: Orthopedics;  Laterality: Left;   ANTERIOR CRUCIATE LIGAMENT REPAIR Left 09/13/2023   Procedure: LEFT KNEE ACL TEAR RECONSTRUCTION;  Surgeon: Jasmine Mesi, MD;  Location: [facility] OR;  Service: Orthopedics;  Laterality: Left;   BONE MARROW ASPIRATION FOR SPINE FUSION ONLY (BMAC) Left 09/13/2023   Procedure: BONE MARROW ASPIRATE CONCENTRATE FROM ILLIAC CREST;  Surgeon: Jasmine Mesi, MD;  Location: [facility] OR;  Service: Orthopedics;  Laterality: Left;  KNEE ARTHROSCOPY WITH MENISCAL REPAIR Left 09/13/2023   Procedure: MEDIAL MENISCAL TEAR REPAIR;  Surgeon: Jasmine Mesi, MD;  Location: [facility] OR;  Service: Orthopedics;  Laterality: Left;   LASIK Bilateral    NO PAST SURGERIES     Social History   Occupational History   Not on file  Tobacco Use   Smoking status: Never   Smokeless tobacco: Never  Vaping Use   Vaping status: Never Used  Substance and Sexual Activity   Alcohol use: Yes    Alcohol/week: 2.0 standard drinks of alcohol    Types: 2 Glasses of wine per week    Comment: occas   Drug use: No   Sexual activity: Yes

## 2023-11-14 ENCOUNTER — Encounter: Payer: Self-pay | Admitting: Physical Therapy

## 2023-11-15 ENCOUNTER — Encounter: Admitting: Physical Therapy

## 2023-11-16 ENCOUNTER — Encounter: Payer: Self-pay | Admitting: Physical Therapy

## 2023-11-16 ENCOUNTER — Ambulatory Visit: Admitting: Physical Therapy

## 2023-11-16 DIAGNOSIS — M25562 Pain in left knee: Secondary | ICD-10-CM

## 2023-11-16 DIAGNOSIS — R600 Localized edema: Secondary | ICD-10-CM | POA: Diagnosis not present

## 2023-11-16 DIAGNOSIS — R2689 Other abnormalities of gait and mobility: Secondary | ICD-10-CM | POA: Diagnosis not present

## 2023-11-16 DIAGNOSIS — M25662 Stiffness of left knee, not elsewhere classified: Secondary | ICD-10-CM

## 2023-11-16 DIAGNOSIS — M6281 Muscle weakness (generalized): Secondary | ICD-10-CM | POA: Diagnosis not present

## 2023-11-16 DIAGNOSIS — G8929 Other chronic pain: Secondary | ICD-10-CM

## 2023-11-16 DIAGNOSIS — F331 Major depressive disorder, recurrent, moderate: Secondary | ICD-10-CM | POA: Diagnosis not present

## 2023-11-16 NOTE — Therapy (Signed)
 OUTPATIENT PHYSICAL THERAPY LOWER EXTREMITY TREATMENT   Patient Name: Jasmine Ortega MRN: [id number] DOB:16-Nov-1980, 43 y.o., female Today's Date: 11/16/2023  END OF SESSION:  PT End of Session - 11/16/23 1505     Visit Number 13    Number of Visits 20    Date for PT Re-Evaluation 12/01/23    Authorization Type [place] [employer]    Progress Note Due on Visit 20    PT Start Time 1505    PT Stop Time 1558    PT Time Calculation (min) 53 min    Activity Tolerance Patient tolerated treatment well;No increased pain    Behavior During Therapy WFL for tasks assessed/performed              Past Medical History:  Diagnosis Date   Birth control    Chicken pox    Depression    GERD (gastroesophageal reflux disease)    Gestational diabetes    Past Surgical History:  Procedure Laterality Date   ALLOGRAFT APPLICATION Left 09/13/2023   Procedure: BONE -PATELLA-TENDON-BONE ALLOGRAFT;  Surgeon: Jasmine Mesi, MD;  Location: [facility] OR;  Service: Orthopedics;  Laterality: Left;   ANTERIOR CRUCIATE LIGAMENT REPAIR Left 09/13/2023   Procedure: LEFT KNEE ACL TEAR RECONSTRUCTION;  Surgeon: Jasmine Mesi, MD;  Location: [facility] OR;  Service: Orthopedics;  Laterality: Left;   BONE MARROW ASPIRATION FOR SPINE FUSION ONLY (BMAC) Left 09/13/2023   Procedure: BONE MARROW ASPIRATE CONCENTRATE FROM ILLIAC CREST;  Surgeon: Jasmine Mesi, MD;  Location: [facility] OR;  Service: Orthopedics;  Laterality: Left;   KNEE ARTHROSCOPY WITH MENISCAL REPAIR Left 09/13/2023   Procedure: MEDIAL MENISCAL TEAR REPAIR;  Surgeon: Jasmine Mesi, MD;  Location: [facility] OR;  Service: Orthopedics;  Laterality: Left;   LASIK Bilateral    NO PAST SURGERIES     Patient Active Problem List   Diagnosis Date Noted   Left anterior cruciate ligament tear 09/18/2023   Acute medial meniscus tear of left knee 09/18/2023   Pain in left knee 08/03/2023   Chronic low back pain without sciatica 09/23/2021   Dysthymia 09/23/2021    Obesity (BMI 30-39.9) 09/14/2019   Hyperlipidemia 09/14/2019   Gastroesophageal reflux disease 04/27/2017   Attention deficit hyperactivity disorder (ADHD), predominantly inattentive type 03/05/2015   Loss of transverse plantar arch of right foot 09/18/2014    PCP: Watson Hacking, MD  REFERRING PROVIDER: Jasmine Mesi, MD  REFERRING DIAG:  [phone] (ICD-10-CM) - Rupture of anterior cruciate ligament of left knee, initial encounter S83.242D (ICD-10-CM) - Other tear of medial meniscus, current injury, left knee, subsequent encounter  THERAPY DIAG:  Muscle weakness (generalized)  Chronic pain of left knee  Stiffness of left knee, not elsewhere classified  Other abnormalities of gait and mobility  Localized edema  Rationale for Evaluation and Treatment: Rehabilitation  ONSET DATE: 09/13/2023 s/p Lt ACL with medial meniscus  SUBJECTIVE:   SUBJECTIVE STATEMENT: She saw Dr. Rozelle Corning who is happy with her recovery including joint integrity and range.  She has some fluid sleeve which she feels the tightness especially at end of work day.  Monday night she had some muscle spasms in quad.  She took ibuprofen  for first time.  She went to gym last week riding bike for 20 min.    PERTINENT HISTORY: 3/18 surgery with Left LEFT KNEE ACL TEAR RECONSTRUCTION , BONE MARROW ASPIRATE CONCENTRATE, Left MEDIAL MENISCAL TEAR REPAIR, Left BONE -PATELLA-TENDON-BONE ALLOGRAFT Chronic LBP, HLD, GERD, ADHD  No hx of DVT-  ok to do BFR  PAIN:  NPRS scale: 1/10 today  Pain location: Lt knee Pain description: burning, numbness Aggravating factors: too much WB, prolonged postures Relieving factors: ice, rest, elevation, pain meds- ibprophin, roboxin at bed time  PRECAUTIONS: Other:  ROM & strengthening closed chain; ok to WBAT in 7 days  WEIGHT BEARING RESTRICTIONS: "ok to WBAT in 7 days" per referral 09/28/2023. However will need clarification.   FALLS:  Has patient fallen in last 6 months?  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 No displaced fragment. Lateral meniscus:  Intact.   LIGAMENTS Cruciates:  The ACL is chronically torn.  The PCL is intact. Collaterals:  Intact.  CARTILAGE Patellofemoral:  Preserved. Medial:  Minimally degenerated. Lateral:  Minimally degenerated. Joint:  Small to moderate joint effusion. Popliteal Fossa:  No Baker's cyst. Extensor Mechanism:  Intact.   Bones: No fracture, stress change or worrisome lesion. Small enchondroma distal femur noted   Other: Subcutaneous edema anterior to the patella and patellar tendon compatible bursitis is seen.   IMPRESSION: 1. Longitudinal tear posterior horn and posterior body of the medial meniscus without displaced fragment. 2. Chronic ACL tear. 3. Mild medial and lateral compartment osteoarthritis. 4. Prepatellar bursitis.    PATIENT SURVEYS:  Patient-Specific Activity Scoring Scheme  "0" represents "unable to perform." "10" represents "able to perform at prior level. 0 1 2 3 4 5 6 7 8 9  10 (Date and Score)   Activity Eval 09/22/23 11/09/23   1. Full AROM of knee 1   6  2. Improved knee stability  1  7  3. Return to jujitsu 1 0  4. stairs 1 1  5. walking 1 9  Score 1/10 4.6   Total score = sum of the activity scores/number of activities Minimum detectable change (90%CI) for average score = 2 points Minimum detectable change (90%CI) for single activity score = 3  points  COGNITION: Evaluation on 09/22/2023:   Overall cognitive status: WFL    SENSATION: Evaluation on 09/22/2023:   [facility]  EDEMA:  Evaluation on 09/22/2023:  Not measured but left knee is larger by observation.  She reports increased with activities.   POSTURE:  Evaluation on 09/22/2023:  No Significant postural limitations  PALPATION: Evaluation on 09/22/2023:  Tender to surgical sites and quadricep  LOWER EXTREMITY ROM:   ROM Left Eval 09/22/2023 Left  09/30/2023 Left 10/26/23  Knee flexion Supine with strap AA: 84 Active at 101 A: 120 (After heelslides 122)  Knee extension Supine quad set A: -7 Active -1    (Blank rows = not tested)  LOWER EXTREMITY MMT:  Lt not tested at eval due to surgery  MMT Right Eval 09/22/23 Left Eval 09/22/23 Left 11/09/23 HHD  Hip flexion 5 4   Hip extension     Hip abduction     Hip adduction     Hip internal rotation     Hip external rotation     Knee flexion 5  3-/5 21.2 ppsi  Knee extension 5 3-/5 42.1 ppsi  Ankle dorsiflexion 5 5   Ankle plantarflexion 5 as seen in gait    Ankle inversion     Ankle eversion      (Blank rows = not tested)  FUNCTIONAL TESTS:  Evaluation on 09/22/2023: Rises from 18in chair with BUE use on armrests and LLE slightly in front to decrease knee bending  GAIT: Evaluation on 09/22/2023: Distance walked: clinical Assistive device utilized: Crutches Level of assistance: SBA Comments: crutches too short, PWB on LLE using 3pt gait, decreased cadence, decreased step length                                                                                                                                                                        TODAY'S TREATMENT 11/16/2023 Therapeutic Exercise: Recumbent bike seat 3 level 3 for 8 min as warm-up (taking subjective). PT educated on seat position / leg length and resistance.   Neuromuscular Re-Education:  Star 5 points reach with knee flexion when reaching out &  ext when coming back to center 5 reps each LE both on floor & on foam.  Single UE support. Pt verbalized understanding as HEP.   Therapeutic Activities: BFR (cuff size 4; 168 mmHg)  leg press LLE only (back flat for quad motion hip/knee for standing) 50# 30-15-15-15 reps; 30 sec rest between sets Stairs alternating pattern with 2 rails 11 steps 2 times.    Modalities Vaso Left knee mod pressure 34* X 10 min  TREATMENT 11/09/23 TherEx Recumbent bike L5 x 6 min; seat 5 TherAct BFR (cuff size 4; 168 mmHg)  leg press 62# 3 x 20; 30 sec rest between sets Step ups 30 sec x 4  (once on 4 inch step, 3 sets on 6 inch step) TRX mini squats 3 x 15 (30 sec rest between each set)  Modalities Vaso Left knee mod pressure 34* X 10 min      11/01/23 TherEx Recumbent bike L5 x 8 min; seat 5  TherAct With BFR (cuff size 4; 168 mmHg) leg press 62# 3x20; 30 sec rest between sets SLS with BFR and RLE taps to Blaze Pods; 3 pods with 5 cycles 30 sec on/30 sec off with intermittent UE support needed   Modalities Vaso Left knee mod pressure 34* X 10 min     10/26/23 TherEx Recumbent bike L5 x 8 min; seat 5 AA heel slides 2x10; 5 sec hold  TherAct Leg press 62# 2x15; 47# 2x15 Sidestepping on foam x 5 laps Tandem walking on foam x 5 laps fwd/bwd  Modalities Vaso Left knee high pressure 34* X 10 min    PATIENT EDUCATION:  Education details: HEP, POC Person educated: Patient  Education method: Explanation, Demonstration, Verbal cues, and Handouts Education comprehension: verbalized understanding, returned demonstration, and verbal cues required  HOME EXERCISE PROGRAM: Access Code: [email or web address] URL: [email or web address] Date: 09/30/2023 Prepared by: Terral Ferrari  Exercises - Ankle Alphabet in Elevation  - 2-3 x daily - 7 x weekly - 1 sets - 2-3 reps - elevate 10-15 min hold - Supine Active Straight Leg Raise (Mirrored)  - 2-3 x daily - 7 x weekly - 2 sets - 10 reps - 5  seconds hold - supine quad set with towel roll under ankle  - 2-3 x daily - 7 x weekly - 2 sets - 10 reps - 5 seconds hold - Supine Heel Slide with Strap  - 2-3 x daily - 7 x weekly - 2 sets - 10 reps - 5 seconds hold - seated single straight leg raise   - 2-3 x daily - 7 x weekly - 2 sets - 10 reps - 5 seconds hold - Seated Quad Set  - 2-3 x daily - 7 x weekly - 2 sets - 10 reps - 5 seconds hold - Seated Knee Flexion Extension AROM   - 2-3 x daily - 7 x weekly - 2 sets - 10 reps - 5 seconds hold - Prone Hip Extension  - 1 x daily - 7 x weekly - 2 sets - 10 reps - 3 seconds hold  ASSESSMENT:  CLINICAL IMPRESSION PT progressed standing functional activities to include LE reaches to work towards patient goal of jujitsu.  She tolerated small range movements with UE support.  Pt able to ascend & descend stairs alternating pattern with 2 rails.  Recommending continued skilled PT interventions.   OBJECTIVE IMPAIRMENTS: Abnormal gait, decreased activity tolerance, decreased balance, decreased coordination, decreased endurance, decreased knowledge of condition, decreased knowledge of use of DME, decreased mobility, difficulty walking, decreased ROM, decreased strength, increased edema, increased muscle spasms, and pain.   ACTIVITY LIMITATIONS: carrying, lifting, bending, sitting, standing, squatting, stairs, and locomotion level  PARTICIPATION LIMITATIONS: meal prep, cleaning, laundry, driving, shopping, community activity, and occupation  PERSONAL FACTORS: Profession, Time since onset of injury/illness/exacerbation, and 1-2 comorbidities: see above are also affecting patient's functional outcome.   REHAB POTENTIAL: Good  CLINICAL DECISION MAKING: Stable/uncomplicated  EVALUATION COMPLEXITY: Low   GOALS: Goals reviewed with patient? Yes  SHORT TERM GOALS: (target date for Short term goals are 3 weeks 10/13/2023)   1.  Patient will demonstrate independent use of home exercise program to  maintain progress from in clinic treatments.  Goal status: MET 10/26/23  2.  Patient will demonstrate AROM 0-95deg   Goal status: MET 10/26/23   LONG TERM GOALS: (target dates for all long term goals are 10 weeks  12/01/2023 )   1. Patient will demonstrate/report pain at worst less than or equal to 2/10 to facilitate minimal limitation in daily activity secondary to pain symptoms.  Goal status: Met 11/09/2023   2. Patient will demonstrate independent use of home exercise program to facilitate ability to maintain/progress functional gains from skilled physical therapy services.  Goal status: Ongoing  11/09/2023   3. Patient will demonstrate Patient specific functional scale avg > or = 7 to indicate reduced disability due to condition.   Goal status: Ongoing  11/09/23   4.  Patient will demonstrate LLE MMT 5/5 throughout to faciltiate usual transfers, stairs, squatting at [facility] for daily life.   Goal status: Ongoing  11/09/2023   5.  Patient will demonstrate/report ability to perform 18 inch chair  transfer without UE assist.   Goal status: MET 11/09/2023   6.  Patient will demonstrate up and down a flight of stairs with single hand rail with reciprocal gait pattern.  Goal status: Ongoing  09/27/2023   7.  patient will demonstrate AROM 0-120 for full range of motion necessary to perform job tasks Goal Status: MET 11/09/23  PLAN:  PT FREQUENCY: 3x/wk for 2 weeks, then 1-2x/week  PT DURATION: 10 weeks  PLANNED INTERVENTIONS: Can include 16109- PT Re-evaluation, 97110-Therapeutic exercises, 97530- Therapeutic activity, 97112- Neuromuscular re-education, 97535- Self Care, 97140- Manual therapy, [phone]- Gait training, [phone]- Orthotic Fit/training, [phone]- Canalith repositioning, [id number]- Aquatic Therapy, [phone]- Electrical stimulation (unattended), [phone]- Electrical stimulation (manual), [id number] Physical performance testing, 97016- Vasopneumatic device, [id number]- Ultrasound, [id number]- Traction (mechanical),  [id number]- Ionotophoresis 4mg /ml Dexamethasone , Blood Flow Restriction Therapy,  Patient/Family education, Balance training, Stair training, Taping, Dry Needling, Joint mobilization, Joint manipulation, Spinal manipulation, Spinal mobilization, Scar mobilization, Vestibular training, Visual/preceptual remediation/compensation, DME instructions, Cryotherapy, and Moist heat.  All performed as medically necessary.  All included unless contraindicated  PLAN FOR NEXT SESSION:  Progress stairs to alternating pattern with single rail.  Begin discussion on d/c vs recert as POC ends 6/5.  Continue Use BFR , quadriceps and appropriate hamstrings/hip strengthening, vaso as needed. Normalize gait   Next MD visit: 01/11/2024   Lorie Rook, PT, DPT 11/16/2023, 4:13 PM

## 2023-11-23 ENCOUNTER — Encounter: Payer: Self-pay | Admitting: Physical Therapy

## 2023-11-23 ENCOUNTER — Ambulatory Visit: Payer: Self-pay | Admitting: Physical Therapy

## 2023-11-23 DIAGNOSIS — M25662 Stiffness of left knee, not elsewhere classified: Secondary | ICD-10-CM

## 2023-11-23 DIAGNOSIS — R2689 Other abnormalities of gait and mobility: Secondary | ICD-10-CM

## 2023-11-23 DIAGNOSIS — M6281 Muscle weakness (generalized): Secondary | ICD-10-CM | POA: Diagnosis not present

## 2023-11-23 DIAGNOSIS — R600 Localized edema: Secondary | ICD-10-CM | POA: Diagnosis not present

## 2023-11-23 DIAGNOSIS — M25562 Pain in left knee: Secondary | ICD-10-CM | POA: Diagnosis not present

## 2023-11-23 DIAGNOSIS — G8929 Other chronic pain: Secondary | ICD-10-CM | POA: Diagnosis not present

## 2023-11-23 NOTE — Therapy (Signed)
 OUTPATIENT PHYSICAL THERAPY LOWER EXTREMITY TREATMENT RECERTIFICATION   Patient Name: PATSYE SULLIVANT MRN: [id number] DOB:[birth year], 43 y.o., female Today's Date: 11/23/2023  END OF SESSION:  PT End of Session - 11/23/23 1422     Visit Number 14    Number of Visits 20    Date for PT Re-Evaluation 01/18/24    Authorization Type [place] [employer]    Progress Note Due on Visit 20    PT Start Time 1422    PT Stop Time 1510    PT Time Calculation (min) 48 min    Activity Tolerance Patient tolerated treatment well;No increased pain    Behavior During Therapy WFL for tasks assessed/performed               Past Medical History:  Diagnosis Date   Birth control    Chicken pox    Depression    GERD (gastroesophageal reflux disease)    Gestational diabetes    Past Surgical History:  Procedure Laterality Date   ALLOGRAFT APPLICATION Left 09/13/2023   Procedure: BONE -PATELLA-TENDON-BONE ALLOGRAFT;  Surgeon: Jasmine Mesi, MD;  Location: [facility] OR;  Service: Orthopedics;  Laterality: Left;   ANTERIOR CRUCIATE LIGAMENT REPAIR Left 09/13/2023   Procedure: LEFT KNEE ACL TEAR RECONSTRUCTION;  Surgeon: Jasmine Mesi, MD;  Location: [facility] OR;  Service: Orthopedics;  Laterality: Left;   BONE MARROW ASPIRATION FOR SPINE FUSION ONLY (BMAC) Left 09/13/2023   Procedure: BONE MARROW ASPIRATE CONCENTRATE FROM ILLIAC CREST;  Surgeon: Jasmine Mesi, MD;  Location: [facility] OR;  Service: Orthopedics;  Laterality: Left;   KNEE ARTHROSCOPY WITH MENISCAL REPAIR Left 09/13/2023   Procedure: MEDIAL MENISCAL TEAR REPAIR;  Surgeon: Jasmine Mesi, MD;  Location: [facility] OR;  Service: Orthopedics;  Laterality: Left;   LASIK Bilateral    NO PAST SURGERIES     Patient Active Problem List   Diagnosis Date Noted   Left anterior cruciate ligament tear 09/18/2023   Acute medial meniscus tear of left knee 09/18/2023   Pain in left knee 08/03/2023   Chronic low back pain without sciatica 09/23/2021    Dysthymia 09/23/2021   Obesity (BMI 30-39.9) 09/14/2019   Hyperlipidemia 09/14/2019   Gastroesophageal reflux disease 04/27/2017   Attention deficit hyperactivity disorder (ADHD), predominantly inattentive type 03/05/2015   Loss of transverse plantar arch of right foot 09/18/2014    PCP: Watson Hacking, MD  REFERRING PROVIDER: Jasmine Mesi, MD  REFERRING DIAG:  [phone] (ICD-10-CM) - Rupture of anterior cruciate ligament of left knee, initial encounter S83.242D (ICD-10-CM) - Other tear of medial meniscus, current injury, left knee, subsequent encounter  THERAPY DIAG:  Muscle weakness (generalized) - Plan: PT plan of care cert/re-cert  Chronic pain of left knee - Plan: PT plan of care cert/re-cert  Stiffness of left knee, not elsewhere classified - Plan: PT plan of care cert/re-cert  Other abnormalities of gait and mobility - Plan: PT plan of care cert/re-cert  Localized edema - Plan: PT plan of care cert/re-cert  Rationale for Evaluation and Treatment: Rehabilitation  ONSET DATE: 09/13/2023 s/p Lt ACL with medial meniscus  SUBJECTIVE:   SUBJECTIVE STATEMENT: Doing well overall; getting back to the gym some   PERTINENT HISTORY: 3/18 surgery with Left LEFT KNEE ACL TEAR RECONSTRUCTION , BONE MARROW ASPIRATE CONCENTRATE, Left MEDIAL MENISCAL TEAR REPAIR, Left BONE -PATELLA-TENDON-BONE ALLOGRAFT Chronic LBP, HLD, GERD, ADHD  No hx of DVT- ok to do BFR  PAIN:  NPRS scale: 1/10 today  Pain location: Lt knee  Pain description: burning, numbness Aggravating factors: too much WB, prolonged postures Relieving factors: ice, rest, elevation, pain meds- ibprophin, roboxin at bed time  PRECAUTIONS: Other:  ROM & strengthening closed chain; ok to WBAT in 7 days  WEIGHT BEARING RESTRICTIONS: "ok to WBAT in 7 days" per referral 09/28/2023. However will need clarification.   FALLS:  Has patient fallen in last 6 months? 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 No displaced fragment. Lateral meniscus:  Intact.   LIGAMENTS Cruciates:  The ACL is chronically torn.  The PCL is intact. Collaterals:  Intact.  CARTILAGE Patellofemoral:  Preserved. Medial:  Minimally degenerated. Lateral:  Minimally degenerated. Joint:  Small to moderate joint effusion. Popliteal Fossa:  No Baker's cyst. Extensor Mechanism:  Intact.   Bones: No fracture, stress change or worrisome lesion. Small enchondroma distal femur noted   Other: Subcutaneous edema anterior to the patella and patellar tendon compatible bursitis is seen.   IMPRESSION: 1. Longitudinal tear posterior horn and posterior body of the medial meniscus without displaced fragment. 2. Chronic ACL tear. 3. Mild medial and lateral compartment osteoarthritis. 4. Prepatellar bursitis.    PATIENT SURVEYS:  Patient-Specific Activity Scoring Scheme  "0" represents "unable to perform." "10" represents "able to perform at prior level. 0 1 2 3 4 5 6 7 8 9  10 (Date and Score)   Activity Eval 09/22/23 11/09/23  11/23/23  1. Full AROM of knee 1   6 8   2. Improved knee stability  1  7 8   3. Return to jujitsu 1 0 0  4. stairs 1 1 4   5. walking 1 9 9   Score 1/10 4.6 5.8   Total score = sum of the activity scores/number of activities Minimum detectable change (90%CI) for average score = 2 points Minimum detectable change (90%CI) for single activity score = 3 points  COGNITION: Evaluation on 09/22/2023:    Overall cognitive status: WFL    SENSATION: Evaluation on 09/22/2023:   WFL  EDEMA:  Evaluation on 09/22/2023:  Not measured but left knee is larger by observation.  She reports increased with activities.   POSTURE:  Evaluation on 09/22/2023:  No Significant postural limitations  PALPATION: Evaluation on 09/22/2023:  Tender to surgical sites and quadricep  LOWER EXTREMITY ROM:   ROM Left Eval 09/22/2023 Left  09/30/2023 Left 10/26/23  Knee flexion Supine with strap AA: 84 Active at 101 A: 120 (After heelslides 122)  Knee extension Supine quad set A: -7 Active -1    (Blank rows = not tested)  LOWER EXTREMITY MMT:  Lt not tested at eval due to surgery  MMT Right Eval 09/22/23 Left Eval 09/22/23 Left 11/09/23 HHD Left 11/23/23 HHD  Hip flexion 5 4    Hip extension      Hip abduction      Hip adduction      Hip internal rotation      Hip external rotation      Knee flexion 5  3-/5 21.2 ppsi 43.6#  Knee extension 5 3-/5 42.1 ppsi 56.1#  Ankle dorsiflexion 5 5    Ankle plantarflexion 5 as seen in gait     Ankle inversion      Ankle eversion       (Blank rows = not tested)  FUNCTIONAL TESTS:  Evaluation on 09/22/2023: Rises from 18in chair with BUE use on armrests and LLE slightly in front to decrease knee bending  GAIT: Evaluation on 09/22/2023: Distance walked: clinical Assistive device utilized: Crutches Level of assistance: SBA Comments: crutches too short, PWB on LLE using 3pt gait, decreased cadence, decreased step length                                                                                                                                                                        TODAY'S TREATMENT 11/23/23 TherEx: Recumbent bike L5 for 8 min as warm-up (taking subjective) MMT - see above for details  TherAct Leg press 75# LLE only 3x10  Physical Performance Test Y Balance Test:  Leg Length 32.75" bil RLE:  Anterior: 22.17"  Posterior Medial:  34"  Posterior Lateral: 29.67" LLE:   Anterior: 20.83"  Posterior Medial: 31.33"  Posterior Lateral: 31.67" Composite Score:  Right: 87%  Left: 85%   11/16/2023 Therapeutic Exercise: Recumbent bike seat 3 level 3 for 8 min as warm-up (taking subjective). PT educated on seat position / leg length and resistance.   Neuromuscular Re-Education:  Star 5 points reach with knee flexion when reaching out & ext when coming back to center 5 reps each LE both on floor & on foam.  Single UE support. Pt verbalized understanding as HEP.   Therapeutic Activities: BFR (cuff size 4; 168 mmHg)  leg press LLE only (back flat for quad motion hip/knee for standing) 50# 30-15-15-15 reps; 30 sec rest between sets Stairs alternating pattern with 2 rails 11 steps 2 times.    Modalities Vaso Left knee mod pressure 34* X 10 min   11/09/23 TherEx Recumbent bike L5 x 6 min; seat 5 TherAct BFR (cuff size 4; 168 mmHg)  leg press 62# 3 x 20; 30 sec rest between sets Step ups 30 sec x 4  (once on 4 inch step, 3 sets on 6 inch step) TRX mini squats 3 x 15 (30 sec rest between each set)  Modalities Vaso Left knee mod pressure 34* X 10 min      11/01/23 TherEx Recumbent bike L5 x 8 min; seat 5  TherAct With BFR (cuff size 4; 168 mmHg) leg press 62# 3x20; 30 sec rest between sets SLS with BFR and RLE taps to Bl[employer] Pods; 3 pods with 5 cycles 30 sec on/30 sec off with intermittent UE support needed  Modalities Vaso Left knee mod pressure 34* X 10 min     10/26/23 TherEx Recumbent bike L5 x 8 min; seat 5 AA heel slides 2x10; 5 sec hold  TherAct Leg press 62# 2x15; 47# 2x15 Sidestepping on foam x 5 laps Tandem walking on foam x 5 laps fwd/bwd  Modalities Vaso Left knee high pressure 34* X 10 min    PATIENT EDUCATION:  Education details: HEP, POC Person educated: Patient Education method: Programmer, multimedia, Demonstration, Verbal cues, and Handouts Education comprehension: verbalized  understanding, returned demonstration, and verbal cues required  HOME EXERCISE PROGRAM: Access Code: [email or web address] URL: [email or web address] Date: 09/30/2023 Prepared by: Terral Ferrari  Exercises - Ankle Alphabet in Elevation  - 2-3 x daily - 7 x weekly - 1 sets - 2-3 reps - elevate 10-15 min hold - Supine Active Straight Leg Raise (Mirrored)  - 2-3 x daily - 7 x weekly - 2 sets - 10 reps - 5 seconds hold - supine quad set with towel roll under ankle  - 2-3 x daily - 7 x weekly - 2 sets - 10 reps - 5 seconds hold - Supine Heel Slide with Strap  - 2-3 x daily - 7 x weekly - 2 sets - 10 reps - 5 seconds hold - seated single straight leg raise   - 2-3 x daily - 7 x weekly - 2 sets - 10 reps - 5 seconds hold - Seated Quad Set  - 2-3 x daily - 7 x weekly - 2 sets - 10 reps - 5 seconds hold - Seated Knee Flexion Extension AROM   - 2-3 x daily - 7 x weekly - 2 sets - 10 reps - 5 seconds hold - Prone Hip Extension  - 1 x daily - 7 x weekly - 2 sets - 10 reps - 3 seconds hold  ASSESSMENT:  CLINICAL IMPRESSION Pt scored 85% on composite score for Y balance test demonstrating need to continue to work on functional strength progressions.  She has met some LTGs and is progressing toward all other unmet goals.  Plan to continue PT 1x/wk or every other week for the next 6-8 weeks to progress functional strength.  OBJECTIVE IMPAIRMENTS: Abnormal gait, decreased activity tolerance, decreased balance, decreased coordination, decreased endurance, decreased knowledge of condition, decreased knowledge of use of DME, decreased mobility, difficulty walking, decreased ROM, decreased strength, increased edema, increased muscle spasms, and pain.   ACTIVITY LIMITATIONS: carrying, lifting, bending, sitting, standing, squatting, stairs, and locomotion level  PARTICIPATION LIMITATIONS: meal prep, cleaning, laundry, driving, shopping, community activity, and occupation  PERSONAL FACTORS: Profession, Time  since onset of injury/illness/exacerbation, and 1-2 comorbidities: see above are also affecting patient's functional outcome.   REHAB POTENTIAL: Good  CLINICAL DECISION MAKING: Stable/uncomplicated  EVALUATION COMPLEXITY: Low   GOALS: Goals reviewed with patient? Yes  SHORT TERM GOALS: (target date for Short term goals are 3 weeks 10/13/2023)   1.  Patient will demonstrate independent use of home exercise program to maintain progress from in clinic treatments. Goal status: MET 10/26/23  2.  Patient will demonstrate AROM 0-95deg  Goal status: MET 10/26/23   LONG TERM GOALS: (target date: 01/18/2024)   1. Patient will demonstrate/report pain at worst less than or equal to 2/10 to facilitate minimal limitation in daily activity secondary to pain symptoms.  Goal status: Met 11/09/2023   2. Patient will demonstrate independent use of home exercise program to facilitate ability to maintain/progress functional gains from skilled physical therapy services. Goal status: Ongoing  11/23/23   3. Patient will demonstrate Patient specific functional scale avg > or = 7 to indicate reduced disability due to condition.  Goal status: Ongoing  11/23/23  4.  Patient will demonstrate LLE MMT 5/5 throughout to faciltiate usual transfers, stairs, squatting at [facility] for daily life.  Goal status: Ongoing  11/23/23   5.  Patient will demonstrate/report ability to perform 18 inch chair transfer without UE assist.  Goal status: MET 11/09/2023   6.  Patient will demonstrate up and down a flight of stairs with single hand rail with reciprocal gait pattern.  Goal status: Ongoing  11/23/23   7.  patient will demonstrate AROM 0-120 for full range of motion necessary to perform job tasks Goal Status: MET 11/09/23  8.  Improve Y balance test to > 90% composite score bil in order to work towards return to [employer]. Goal Status: INITIAL 11/23/23  PLAN:  PT FREQUENCY: 1x/wk  PT DURATION: 8 weeks  PLANNED  INTERVENTIONS: Can include 40981- PT Re-evaluation, 97110-Therapeutic exercises, 97530- Therapeutic activity, 97112- Neuromuscular re-education, 97535- Self Care, 97140- Manual therapy, [phone]- Gait training, [phone]- Orthotic Fit/training, [phone]- Canalith repositioning, [id number]- Aquatic Therapy, [phone]- Electrical stimulation (unattended), [phone]- Electrical stimulation (manual), [id number] Physical performance testing, 97016- Vasopneumatic device, [id number]- Ultrasound, [id number]- Traction (mechanical), [id number]- Ionotophoresis 4mg /ml Dexamethasone , Blood Flow Restriction Therapy,  Patient/Family education, Balance training, Stair training, Taping, Dry Needling, Joint mobilization, Joint manipulation, Spinal manipulation, Spinal mobilization, Scar mobilization, Vestibular training, Visual/preceptual remediation/compensation, DME instructions, Cryotherapy, and Moist heat.  All performed as medically necessary.  All included unless contraindicated  PLAN FOR NEXT SESSION:  Continue with functional strength, BFR PRN   Next MD visit: 01/11/2024   Marley Simmers, PT, DPT 11/23/23 3:15 PM

## 2023-11-30 ENCOUNTER — Other Ambulatory Visit (HOSPITAL_COMMUNITY): Payer: Self-pay

## 2023-11-30 ENCOUNTER — Other Ambulatory Visit: Payer: Self-pay

## 2023-11-30 DIAGNOSIS — Z01419 Encounter for gynecological examination (general) (routine) without abnormal findings: Secondary | ICD-10-CM | POA: Diagnosis not present

## 2023-11-30 DIAGNOSIS — Z1231 Encounter for screening mammogram for malignant neoplasm of breast: Secondary | ICD-10-CM | POA: Diagnosis not present

## 2023-11-30 MED ORDER — ESTRADIOL 0.0375 MG/24HR TD PTWK
MEDICATED_PATCH | TRANSDERMAL | 4 refills | Status: AC
  Filled 2023-11-30: qty 12, 84d supply, fill #0
  Filled 2024-02-16: qty 12, 84d supply, fill #1
  Filled 2024-05-10: qty 12, 84d supply, fill #2
  Filled 2024-08-02: qty 12, 84d supply, fill #3

## 2023-11-30 MED ORDER — METRONIDAZOLE 500 MG PO TABS
500.0000 mg | ORAL_TABLET | Freq: Two times a day (BID) | ORAL | 0 refills | Status: AC
Start: 2023-11-30 — End: ?
  Filled 2023-11-30: qty 14, 7d supply, fill #0

## 2023-12-07 ENCOUNTER — Encounter: Payer: Self-pay | Admitting: Physical Therapy

## 2023-12-07 ENCOUNTER — Ambulatory Visit: Payer: Self-pay | Admitting: Physical Therapy

## 2023-12-07 DIAGNOSIS — R2689 Other abnormalities of gait and mobility: Secondary | ICD-10-CM | POA: Diagnosis not present

## 2023-12-07 DIAGNOSIS — R600 Localized edema: Secondary | ICD-10-CM | POA: Diagnosis not present

## 2023-12-07 DIAGNOSIS — F331 Major depressive disorder, recurrent, moderate: Secondary | ICD-10-CM | POA: Diagnosis not present

## 2023-12-07 DIAGNOSIS — M6281 Muscle weakness (generalized): Secondary | ICD-10-CM | POA: Diagnosis not present

## 2023-12-07 DIAGNOSIS — M25662 Stiffness of left knee, not elsewhere classified: Secondary | ICD-10-CM | POA: Diagnosis not present

## 2023-12-07 DIAGNOSIS — M25562 Pain in left knee: Secondary | ICD-10-CM | POA: Diagnosis not present

## 2023-12-07 DIAGNOSIS — G8929 Other chronic pain: Secondary | ICD-10-CM | POA: Diagnosis not present

## 2023-12-07 NOTE — Therapy (Signed)
 OUTPATIENT PHYSICAL THERAPY LOWER EXTREMITY TREATMENT   Patient Name: Jasmine Ortega MRN: [id number] DOB:12/24/1980, 43 y.o., female Today's Date: 12/07/2023  END OF SESSION:  PT End of Session - 12/07/23 0838     Visit Number 15    Number of Visits 20    Date for PT Re-Evaluation 01/18/24    Authorization Type [employer]    Progress Note Due on Visit 20    PT Start Time [phone]    PT Stop Time 0916    PT Time Calculation (min) 38 min    Activity Tolerance Patient tolerated treatment well;No increased pain    Behavior During Therapy WFL for tasks assessed/performed                Past Medical History:  Diagnosis Date   Birth control    Chicken pox    Depression    GERD (gastroesophageal reflux disease)    Gestational diabetes    Past Surgical History:  Procedure Laterality Date   ALLOGRAFT APPLICATION Left 09/13/2023   Procedure: BONE -PATELLA-TENDON-BONE ALLOGRAFT;  Surgeon: Jasmine Mesi, MD;  Location: [facility] OR;  Service: Orthopedics;  Laterality: Left;   ANTERIOR CRUCIATE LIGAMENT REPAIR Left 09/13/2023   Procedure: LEFT KNEE ACL TEAR RECONSTRUCTION;  Surgeon: Jasmine Mesi, MD;  Location: [facility] OR;  Service: Orthopedics;  Laterality: Left;   BONE MARROW ASPIRATION FOR SPINE FUSION ONLY (BMAC) Left 09/13/2023   Procedure: BONE MARROW ASPIRATE CONCENTRATE FROM ILLIAC CREST;  Surgeon: Jasmine Mesi, MD;  Location: [facility] OR;  Service: Orthopedics;  Laterality: Left;   KNEE ARTHROSCOPY WITH MENISCAL REPAIR Left 09/13/2023   Procedure: MEDIAL MENISCAL TEAR REPAIR;  Surgeon: Jasmine Mesi, MD;  Location: [facility] OR;  Service: Orthopedics;  Laterality: Left;   LASIK Bilateral    NO PAST SURGERIES     Patient Active Problem List   Diagnosis Date Noted   Left anterior cruciate ligament tear 09/18/2023   Acute medial meniscus tear of left knee 09/18/2023   Pain in left knee 08/03/2023   Chronic low back pain without sciatica 09/23/2021   Dysthymia 09/23/2021    Obesity (BMI 30-39.9) 09/14/2019   Hyperlipidemia 09/14/2019   Gastroesophageal reflux disease 04/27/2017   Attention deficit hyperactivity disorder (ADHD), predominantly inattentive type 03/05/2015   Loss of transverse plantar arch of right foot 09/18/2014    PCP: Watson Hacking, MD  REFERRING PROVIDER: Jasmine Mesi, MD  REFERRING DIAG:  [phone] (ICD-10-CM) - Rupture of anterior cruciate ligament of left knee, initial encounter S83.242D (ICD-10-CM) - Other tear of medial meniscus, current injury, left knee, subsequent encounter  THERAPY DIAG:  Muscle weakness (generalized)  Chronic pain of left knee  Stiffness of left knee, not elsewhere classified  Other abnormalities of gait and mobility  Localized edema  Rationale for Evaluation and Treatment: Rehabilitation  ONSET DATE: 09/13/2023 s/p Lt ACL with medial meniscus  SUBJECTIVE:   SUBJECTIVE STATEMENT: She has been doing her exercises but inconsistency.  She switched from recumbent bike to upright bike but did not notice difference.    PERTINENT HISTORY: 3/18 surgery with Left LEFT KNEE ACL TEAR RECONSTRUCTION , BONE MARROW ASPIRATE CONCENTRATE, Left MEDIAL MENISCAL TEAR REPAIR, Left BONE -PATELLA-TENDON-BONE ALLOGRAFT Chronic LBP, HLD, GERD, ADHD  No hx of DVT- ok to do BFR  PAIN:  NPRS scale:  0/10 today and over last week highest 2/10 Pain location: Lt knee Pain description: burning, numbness Aggravating factors: too much WB, prolonged postures, walking end of day  with muscle fatigue Relieving factors: ice, rest, elevation, pain meds- ibprophin, roboxin at bed time  PRECAUTIONS: Other:  ROM & strengthening closed chain; ok to WBAT in 7 days  WEIGHT BEARING RESTRICTIONS: ok to WBAT in 7 days per referral 09/28/2023. However will need clarification.   FALLS:  Has patient fallen in last 6 months? 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 No displaced fragment. Lateral meniscus:  Intact.   LIGAMENTS Cruciates:  The ACL is chronically torn.  The PCL is intact. Collaterals:  Intact.  CARTILAGE Patellofemoral:  Preserved. Medial:  Minimally degenerated. Lateral:  Minimally degenerated. Joint:  Small to moderate joint effusion. Popliteal Fossa:  No Baker's cyst. Extensor Mechanism:  Intact.   Bones: No fracture, stress change or worrisome lesion. Small enchondroma distal femur noted   Other: Subcutaneous edema anterior to the patella and patellar tendon compatible bursitis is seen.   IMPRESSION: 1. Longitudinal tear posterior horn and posterior body of the medial meniscus without displaced fragment. 2. Chronic ACL tear. 3. Mild medial and lateral compartment osteoarthritis. 4. Prepatellar bursitis.    PATIENT SURVEYS:  Patient-Specific Activity Scoring Scheme  0 represents "unable to perform." 10 represents "able to perform at prior level. 0 1 2 3 4 5 6 7 8 9  10 (Date and Score)   Activity Eval 09/22/23 11/09/23  11/23/23  1. Full AROM of knee 1   6 8   2. Improved knee stability  1  7 8   3. Return to jujitsu 1 0 0  4. stairs 1 1 4   5. walking 1 9 9   Score 1/10 4.6 5.8   Total score = sum of the activity scores/number of activities Minimum detectable change (90%CI) for average score = 2 points Minimum detectable change (90%CI) for single activity score = 3 points  COGNITION: Evaluation on 09/22/2023:   Overall cognitive status:  WFL    SENSATION: Evaluation on 09/22/2023:   WFL  EDEMA:  Evaluation on 09/22/2023:  Not measured but left knee is larger by observation.  She reports increased with activities.   POSTURE:  Evaluation on 09/22/2023:  No Significant postural limitations  PALPATION: Evaluation on 09/22/2023:  Tender to surgical sites and quadricep  LOWER EXTREMITY ROM:   ROM Left Eval 09/22/2023 Left  09/30/2023 Left 10/26/23  Knee flexion Supine with strap AA: 84 Active at 101 A: 120 (After heelslides 122)  Knee extension Supine quad set A: -7 Active -1    (Blank rows = not tested)  LOWER EXTREMITY MMT:  Lt not tested at eval due to surgery  MMT Right Eval 09/22/23 Left Eval 09/22/23 Left 11/09/23 HHD Left 11/23/23 HHD  Hip flexion 5 4    Hip extension      Hip abduction      Hip adduction      Hip internal rotation      Hip external rotation      Knee flexion 5 3-/5 21.2 ppsi 43.6#  Knee extension 5 3-/5 42.1 ppsi  56.1#  Ankle dorsiflexion 5 5    Ankle plantarflexion 5 as seen in gait     Ankle inversion      Ankle eversion       (Blank rows = not tested)  FUNCTIONAL TESTS:  Evaluation on 09/22/2023: Rises from 18in chair with BUE use on armrests and LLE slightly in front to decrease knee bending  GAIT: Evaluation on 09/22/2023: Distance walked: clinical Assistive device utilized: Crutches Level of assistance: SBA Comments: crutches too short, PWB on LLE using 3pt gait, decreased cadence, decreased step length                                                                                                                                                                        TODAY'S TREATMENT 12/07/2023  Therapeutic Exercise: Recumbent bike seat 3 level 4 for 8 min as warm-up (taking subjective). PT educated on seat position / leg length and resistance with both stationary bike designs.  Gastroc stretch incline board 20 sec hold 3 reps Heel raises BLEs without UE support 10  reps 2 sets.  Therapeutic Activities: BFR (cuff size 4; 168 mmHg)  leg press LLE only (back flat for quad motion hip/knee for standing) 50# 30-15-15-15 reps; 30 sec rest between sets Lunge using 2 chairs for UE support:  5 reps with each LE lead.  PT demo & verbal cues on technique including how to work on outside of PT.  This is precursor for Jui jitsu. Pt verbalized understanding.   Stairs alternating pattern with 11 steps 2 times 1st rep descending  2 rails, 2nd rep contralateral rail;  ascending both reps contralateral rail.  No knee instability noted but slow guarded motions esp descending.  Slider 5 points (ant, ant-lat, lat, post-lat, lat) stance LE flexion on outward motion & extension on inward motion with UE support on sink prn: 3 reps with each LE.   TREATMENT 11/23/23 TherEx: Recumbent bike L5 for 8 min as warm-up (taking subjective) MMT - see above for details  TherAct Leg press 75# LLE only 3x10  Physical Performance Test Y Balance Test:  Leg Length 32.75 bil RLE:  Anterior: 22.17  Posterior Medial: 34  Posterior Lateral: 29.67 LLE:   Anterior: 20.83  Posterior Medial: 31.33  Posterior Lateral: 31.67 Composite Score:  Right: 87%  Left: 85%   11/16/2023 Therapeutic Exercise: Recumbent bike seat 3 level 3 for 8 min as warm-up (taking subjective). PT educated on seat position / leg length and resistance.   Neuromuscular Re-Education:  Star 5 points reach with knee flexion when reaching out & ext when coming back to center 5 reps each LE both on floor & on foam.  Single UE support. Pt verbalized understanding as HEP.  Therapeutic Activities: BFR (cuff size 4; 168 mmHg)  leg press LLE only (back flat for quad motion hip/knee for standing) 50# 30-15-15-15 reps; 30 sec rest between sets Stairs alternating pattern with 2 rails 11 steps 2 times.    Modalities Vaso Left knee mod pressure 34* X 10 min   11/09/23 TherEx Recumbent bike L5 x 6 min; seat  5 TherAct BFR (cuff size 4; 168 mmHg)  leg press 62# 3 x 20; 30 sec rest between sets Step ups 30 sec x 4  (once on 4 inch step, 3 sets on 6 inch step) TRX mini squats 3 x 15 (30 sec rest between each set)  Modalities Vaso Left knee mod pressure 34* X 10 min      11/01/23 TherEx Recumbent bike L5 x 8 min; seat 5  TherAct With BFR (cuff size 4; 168 mmHg) leg press 62# 3x20; 30 sec rest between sets SLS with BFR and RLE taps to Blaze Pods; 3 pods with 5 cycles 30 sec on/30 sec off with intermittent UE support needed   Modalities Vaso Left knee mod pressure 34* X 10 min    PATIENT EDUCATION:  Education details: HEP, POC Person educated: Patient Education method: Programmer, multimedia, Demonstration, Verbal cues, and Handouts Education comprehension: verbalized understanding, returned demonstration, and verbal cues required  HOME EXERCISE PROGRAM: Access Code: [email or web address] URL: [email or web address] Date: 09/30/2023 Prepared by: Terral Ferrari  Exercises - Ankle Alphabet in Elevation  - 2-3 x daily - 7 x weekly - 1 sets - 2-3 reps - elevate 10-15 min hold - Supine Active Straight Leg Raise (Mirrored)  - 2-3 x daily - 7 x weekly - 2 sets - 10 reps - 5 seconds hold - supine quad set with towel roll under ankle  - 2-3 x daily - 7 x weekly - 2 sets - 10 reps - 5 seconds hold - Supine Heel Slide with Strap  - 2-3 x daily - 7 x weekly - 2 sets - 10 reps - 5 seconds hold - seated single straight leg raise   - 2-3 x daily - 7 x weekly - 2 sets - 10 reps - 5 seconds hold - Seated Quad Set  - 2-3 x daily - 7 x weekly - 2 sets - 10 reps - 5 seconds hold - Seated Knee Flexion Extension AROM   - 2-3 x daily - 7 x weekly - 2 sets - 10 reps - 5 seconds hold - Prone Hip Extension  - 1 x daily - 7 x weekly - 2 sets - 10 reps - 3 seconds hold  ASSESSMENT:  CLINICAL IMPRESSION PT instructed pt in option of slider for star reach which she appears to understand.  PT also introduced lunge with  BUE support which she was able to do but required chairs for support.  Pt continues to benefit from skilled PT.   OBJECTIVE IMPAIRMENTS: Abnormal gait, decreased activity tolerance, decreased balance, decreased coordination, decreased endurance, decreased knowledge of condition, decreased knowledge of use of DME, decreased mobility, difficulty walking, decreased ROM, decreased strength, increased edema, increased muscle spasms, and pain.   ACTIVITY LIMITATIONS: carrying, lifting, bending, sitting, standing, squatting, stairs, and locomotion level  PARTICIPATION LIMITATIONS: meal prep, cleaning, laundry, driving, shopping, community activity, and occupation  PERSONAL FACTORS: Profession, Time since onset of injury/illness/exacerbation, and 1-2 comorbidities: see above are also affecting patient's functional outcome.   REHAB POTENTIAL: Good  CLINICAL DECISION MAKING: Stable/uncomplicated  EVALUATION COMPLEXITY: Low   GOALS: Goals reviewed with patient?  Yes  SHORT TERM GOALS: (target date for Short term goals are 3 weeks 10/13/2023)   1.  Patient will demonstrate independent use of home exercise program to maintain progress from in clinic treatments. Goal status: MET 10/26/23  2.  Patient will demonstrate AROM 0-95deg  Goal status: MET 10/26/23   LONG TERM GOALS: (target date: 01/18/2024)   1. Patient will demonstrate/report pain at worst less than or equal to 2/10 to facilitate minimal limitation in daily activity secondary to pain symptoms.  Goal status: Met 11/09/2023   2. Patient will demonstrate independent use of home exercise program to facilitate ability to maintain/progress functional gains from skilled physical therapy services. Goal status: Ongoing  12/07/2023   3. Patient will demonstrate Patient specific functional scale avg > or = 7 to indicate reduced disability due to condition.  Goal status: Ongoing  12/07/2023  4.  Patient will demonstrate LLE MMT 5/5 throughout to  faciltiate usual transfers, stairs, squatting at [facility] for daily life.  Goal status: Ongoing  12/07/2023   5.  Patient will demonstrate/report ability to perform 18 inch chair transfer without UE assist.  Goal status: MET 11/09/2023   6.  Patient will demonstrate up and down a flight of stairs with single hand rail with reciprocal gait pattern.  Goal status: Ongoing  12/07/2023   7.  patient will demonstrate AROM 0-120 for full range of motion necessary to perform job tasks Goal Status: MET 11/09/23  8.  Improve Y balance test to > 90% composite score bil in order to work towards return to [employer]. Goal Status: Ongoing  12/07/2023  PLAN:  PT FREQUENCY: 1x/wk  PT DURATION: 8 weeks  PLANNED INTERVENTIONS: Can include 40981- PT Re-evaluation, 97110-Therapeutic exercises, 97530- Therapeutic activity, 97112- Neuromuscular re-education, 97535- Self Care, 97140- Manual therapy, [phone]- Gait training, [phone]- Orthotic Fit/training, [phone]- Canalith repositioning, [id number]- Aquatic Therapy, [phone]- Electrical stimulation (unattended), [phone]- Electrical stimulation (manual), [id number] Physical performance testing, 97016- Vasopneumatic device, [id number]- Ultrasound, [id number]- Traction (mechanical), [id number]- Ionotophoresis 4mg /ml Dexamethasone , Blood Flow Restriction Therapy,  Patient/Family education, Balance training, Stair training, Taping, Dry Needling, Joint mobilization, Joint manipulation, Spinal manipulation, Spinal mobilization, Scar mobilization, Vestibular training, Visual/preceptual remediation/compensation, DME instructions, Cryotherapy, and Moist heat.  All performed as medically necessary.  All included unless contraindicated  PLAN FOR NEXT SESSION:  Check on lunge & slider exercise.  Continue with functional strength, BFR PRN   Next MD visit: 01/11/2024    Lorie Rook, PT, DPT 12/07/2023, 9:21 AM

## 2023-12-14 ENCOUNTER — Encounter: Admitting: Physical Therapy

## 2023-12-21 ENCOUNTER — Encounter: Payer: Self-pay | Admitting: Physical Therapy

## 2023-12-21 ENCOUNTER — Ambulatory Visit: Admitting: Physical Therapy

## 2023-12-21 DIAGNOSIS — R600 Localized edema: Secondary | ICD-10-CM

## 2023-12-21 DIAGNOSIS — M6281 Muscle weakness (generalized): Secondary | ICD-10-CM | POA: Diagnosis not present

## 2023-12-21 DIAGNOSIS — R2689 Other abnormalities of gait and mobility: Secondary | ICD-10-CM | POA: Diagnosis not present

## 2023-12-21 DIAGNOSIS — G8929 Other chronic pain: Secondary | ICD-10-CM

## 2023-12-21 DIAGNOSIS — M25562 Pain in left knee: Secondary | ICD-10-CM | POA: Diagnosis not present

## 2023-12-21 DIAGNOSIS — M25662 Stiffness of left knee, not elsewhere classified: Secondary | ICD-10-CM

## 2023-12-21 NOTE — Therapy (Signed)
 OUTPATIENT PHYSICAL THERAPY LOWER EXTREMITY TREATMENT   Patient Name: Jasmine Ortega MRN: [id number] DOB:[birth year], 43 y.o., female Today's Date: 12/21/2023  END OF SESSION:  PT End of Session - 12/21/23 1423     Visit Number 16    Number of Visits 20    Date for PT Re-Evaluation 01/18/24    Authorization Type [place] [employer]    Progress Note Due on Visit 20    PT Start Time 1422    PT Stop Time 1503    PT Time Calculation (min) 41 min    Activity Tolerance Patient tolerated treatment well;No increased pain    Behavior During Therapy WFL for tasks assessed/performed              Past Medical History:  Diagnosis Date   Birth control    Chicken pox    Depression    GERD (gastroesophageal reflux disease)    Gestational diabetes    Past Surgical History:  Procedure Laterality Date   ALLOGRAFT APPLICATION Left 09/13/2023   Procedure: BONE -PATELLA-TENDON-BONE ALLOGRAFT;  Surgeon: Addie Cordella Hamilton, MD;  Location: [facility] OR;  Service: Orthopedics;  Laterality: Left;   ANTERIOR CRUCIATE LIGAMENT REPAIR Left 09/13/2023   Procedure: LEFT KNEE ACL TEAR RECONSTRUCTION;  Surgeon: Addie Cordella Hamilton, MD;  Location: [facility] OR;  Service: Orthopedics;  Laterality: Left;   BONE MARROW ASPIRATION FOR SPINE FUSION ONLY (BMAC) Left 09/13/2023   Procedure: BONE MARROW ASPIRATE CONCENTRATE FROM ILLIAC CREST;  Surgeon: Addie Cordella Hamilton, MD;  Location: [facility] OR;  Service: Orthopedics;  Laterality: Left;   KNEE ARTHROSCOPY WITH MENISCAL REPAIR Left 09/13/2023   Procedure: MEDIAL MENISCAL TEAR REPAIR;  Surgeon: Addie Cordella Hamilton, MD;  Location: [facility] OR;  Service: Orthopedics;  Laterality: Left;   LASIK Bilateral    NO PAST SURGERIES     Patient Active Problem List   Diagnosis Date Noted   Left anterior cruciate ligament tear 09/18/2023   Acute medial meniscus tear of left knee 09/18/2023   Pain in left knee 08/03/2023   Chronic low back pain without sciatica 09/23/2021   Dysthymia 09/23/2021    Obesity (BMI 30-39.9) 09/14/2019   Hyperlipidemia 09/14/2019   Gastroesophageal reflux disease 04/27/2017   Attention deficit hyperactivity disorder (ADHD), predominantly inattentive type 03/05/2015   Loss of transverse plantar arch of right foot 09/18/2014    PCP: Joyce Norleen BROCKS, MD  REFERRING PROVIDER: Addie Cordella Hamilton, MD  REFERRING DIAG:  [phone] (ICD-10-CM) - Rupture of anterior cruciate ligament of left knee, initial encounter S83.242D (ICD-10-CM) - Other tear of medial meniscus, current injury, left knee, subsequent encounter  THERAPY DIAG:  Muscle weakness (generalized)  Chronic pain of left knee  Stiffness of left knee, not elsewhere classified  Other abnormalities of gait and mobility  Localized edema  Rationale for Evaluation and Treatment: Rehabilitation  ONSET DATE: 09/13/2023 s/p Lt ACL with medial meniscus  SUBJECTIVE:   SUBJECTIVE STATEMENT: Still having some discomfort with anterior knee pain.    PERTINENT HISTORY: 3/18 surgery with Left LEFT KNEE ACL TEAR RECONSTRUCTION , BONE MARROW ASPIRATE CONCENTRATE, Left MEDIAL MENISCAL TEAR REPAIR, Left BONE -PATELLA-TENDON-BONE ALLOGRAFT Chronic LBP, HLD, GERD, ADHD  No hx of DVT- ok to do BFR  PAIN:  NPRS scale:  0/10 today and over last week highest 2/10 Pain location: Lt knee Pain description: burning, numbness Aggravating factors: too much WB, prolonged postures, walking end of day with muscle fatigue Relieving factors: ice, rest, elevation, pain meds- ibprophin, roboxin at bed time  PRECAUTIONS: Other:  ROM & strengthening closed chain; ok to WBAT in 7 days  WEIGHT BEARING RESTRICTIONS: ok to WBAT in 7 days per referral 09/28/2023. However will need clarification.   FALLS:  Has patient fallen in last 6 months? 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 No displaced fragment. Lateral meniscus:  Intact.   LIGAMENTS Cruciates:  The ACL is chronically torn.  The PCL is intact. Collaterals:  Intact.  CARTILAGE Patellofemoral:  Preserved. Medial:  Minimally degenerated. Lateral:  Minimally degenerated. Joint:  Small to moderate joint effusion. Popliteal Fossa:  No Baker's cyst. Extensor Mechanism:  Intact.   Bones: No fracture, stress change or worrisome lesion. Small enchondroma distal femur noted   Other: Subcutaneous edema anterior to the patella and patellar tendon compatible bursitis is seen.   IMPRESSION: 1. Longitudinal tear posterior horn and posterior body of the medial meniscus without displaced fragment. 2. Chronic ACL tear. 3. Mild medial and lateral compartment osteoarthritis. 4. Prepatellar bursitis.    PATIENT SURVEYS:  Patient-Specific Activity Scoring Scheme  0 represents "unable to perform." 10 represents "able to perform at prior level. 0 1 2 3 4 5 6 7 8 9  10 (Date and Score)   Activity Eval 09/22/23 11/09/23  11/23/23  1. Full AROM of knee 1   6 8   2. Improved knee stability  1  7 8   3. Return to jujitsu 1 0 0  4. stairs 1 1 4   5. walking 1 9 9   Score 1/10 4.6 5.8   Total score = sum of the activity scores/number of activities Minimum detectable change (90%CI) for average score = 2 points Minimum detectable change (90%CI) for single activity score = 3 points  COGNITION: Evaluation on 09/22/2023:   Overall cognitive status: WFL    SENSATION: Evaluation on 09/22/2023:   WFL  EDEMA:  Evaluation on 09/22/2023:  Not measured  but left knee is larger by observation.  She reports increased with activities.   POSTURE:  Evaluation on 09/22/2023:  No Significant postural limitations  PALPATION: Evaluation on 09/22/2023:  Tender to surgical sites and quadricep  LOWER EXTREMITY ROM:   ROM Left Eval 09/22/2023 Left  09/30/2023 Left 10/26/23  Knee flexion Supine with strap AA: 84 Active at 101 A: 120 (After heelslides 122)  Knee extension Supine quad set A: -7 Active -1    (Blank rows = not tested)  LOWER EXTREMITY MMT:  Lt not tested at eval due to surgery  MMT Right Eval 09/22/23 Left Eval 09/22/23 Left 11/09/23 HHD Left 11/23/23 HHD  Hip flexion 5 4    Hip extension      Hip abduction      Hip adduction      Hip internal rotation      Hip external rotation      Knee flexion 5 3-/5 21.2 ppsi 43.6#  Knee extension 5 3-/5 42.1 ppsi 56.1#  Ankle dorsiflexion 5 5    Ankle plantarflexion 5 as seen in gait  Ankle inversion      Ankle eversion       (Blank rows = not tested)  FUNCTIONAL TESTS:  Evaluation on 09/22/2023: Rises from 18in chair with BUE use on armrests and LLE slightly in front to decrease knee bending  GAIT: Evaluation on 09/22/2023: Distance walked: clinical Assistive device utilized: Crutches Level of assistance: SBA Comments: crutches too short, PWB on LLE using 3pt gait, decreased cadence, decreased step length                                                                                                                                                                        TODAY'S TREATMENT 12/21/23 TherEx Recumbent bike; L7 x 8 min  TherAct TRX lateral lunge and curtsy lunges 2x10 bil; on sliders SLS with multidirectional kick x 10 reps bil Single leg deadlift 15# x10 bil   12/07/2023  Therapeutic Exercise: Recumbent bike seat 3 level 4 for 8 min as warm-up (taking subjective). PT educated on seat position / leg length and resistance with both stationary bike  designs.  Gastroc stretch incline board 20 sec hold 3 reps Heel raises BLEs without UE support 10 reps 2 sets.  Therapeutic Activities: BFR (cuff size 4; 168 mmHg)  leg press LLE only (back flat for quad motion hip/knee for standing) 50# 30-15-15-15 reps; 30 sec rest between sets Lunge using 2 chairs for UE support:  5 reps with each LE lead.  PT demo & verbal cues on technique including how to work on outside of PT.  This is precursor for Jui jitsu. Pt verbalized understanding.   Stairs alternating pattern with 11 steps 2 times 1st rep descending  2 rails, 2nd rep contralateral rail;  ascending both reps contralateral rail.  No knee instability noted but slow guarded motions esp descending.  Slider 5 points (ant, ant-lat, lat, post-lat, lat) stance LE flexion on outward motion & extension on inward motion with UE support on sink prn: 3 reps with each LE.    11/23/23 TherEx: Recumbent bike L5 for 8 min as warm-up (taking subjective) MMT - see above for details  TherAct Leg press 75# LLE only 3x10  Physical Performance Test Y Balance Test:  Leg Length 32.75 bil RLE:  Anterior: 22.17  Posterior Medial: 34  Posterior Lateral: 29.67 LLE:   Anterior: 20.83  Posterior Medial: 31.33  Posterior Lateral: 31.67 Composite Score:  Right: 87%  Left: 85%   11/16/2023 Therapeutic Exercise: Recumbent bike seat 3 level 3 for 8 min as warm-up (taking subjective). PT educated on seat position / leg length and resistance.   Neuromuscular Re-Education:  Star 5 points reach with knee flexion when reaching out & ext when coming back to center 5 reps each LE  both on floor & on foam.  Single UE support. Pt verbalized understanding as HEP.   Therapeutic Activities: BFR (cuff size 4; 168 mmHg)  leg press LLE only (back flat for quad motion hip/knee for standing) 50# 30-15-15-15 reps; 30 sec rest between sets Stairs alternating pattern with 2 rails 11 steps 2 times.    Modalities Vaso Left  knee mod pressure 34* X 10 min   PATIENT EDUCATION:  Education details: HEP, POC Person educated: Patient Education method: Explanation, Demonstration, Verbal cues, and Handouts Education comprehension: verbalized understanding, returned demonstration, and verbal cues required  HOME EXERCISE PROGRAM: Access Code: [email or web address] URL: [email or web address] Date: 09/30/2023 Prepared by: Lamar Ivory  Exercises - Ankle Alphabet in Elevation  - 2-3 x daily - 7 x weekly - 1 sets - 2-3 reps - elevate 10-15 min hold - Supine Active Straight Leg Raise (Mirrored)  - 2-3 x daily - 7 x weekly - 2 sets - 10 reps - 5 seconds hold - supine quad set with towel roll under ankle  - 2-3 x daily - 7 x weekly - 2 sets - 10 reps - 5 seconds hold - Supine Heel Slide with Strap  - 2-3 x daily - 7 x weekly - 2 sets - 10 reps - 5 seconds hold - seated single straight leg raise   - 2-3 x daily - 7 x weekly - 2 sets - 10 reps - 5 seconds hold - Seated Quad Set  - 2-3 x daily - 7 x weekly - 2 sets - 10 reps - 5 seconds hold - Seated Knee Flexion Extension AROM   - 2-3 x daily - 7 x weekly - 2 sets - 10 reps - 5 seconds hold - Prone Hip Extension  - 1 x daily - 7 x weekly - 2 sets - 10 reps - 3 seconds hold  ASSESSMENT:  CLINICAL IMPRESSION Session today focused on pre-jujitsu exercises as she is able which she tolerated well with mild ant knee pain with eccentric loading.  Progressing well but LLE still weaker than RLE needing continued PT.  OBJECTIVE IMPAIRMENTS: Abnormal gait, decreased activity tolerance, decreased balance, decreased coordination, decreased endurance, decreased knowledge of condition, decreased knowledge of use of DME, decreased mobility, difficulty walking, decreased ROM, decreased strength, increased edema, increased muscle spasms, and pain.   ACTIVITY LIMITATIONS: carrying, lifting, bending, sitting, standing, squatting, stairs, and locomotion level  PARTICIPATION LIMITATIONS:  meal prep, cleaning, laundry, driving, shopping, community activity, and occupation  PERSONAL FACTORS: Profession, Time since onset of injury/illness/exacerbation, and 1-2 comorbidities: see above are also affecting patient's functional outcome.   REHAB POTENTIAL: Good  CLINICAL DECISION MAKING: Stable/uncomplicated  EVALUATION COMPLEXITY: Low   GOALS: Goals reviewed with patient? Yes  SHORT TERM GOALS: (target date for Short term goals are 3 weeks 10/13/2023)   1.  Patient will demonstrate independent use of home exercise program to maintain progress from in clinic treatments. Goal status: MET 10/26/23  2.  Patient will demonstrate AROM 0-95deg  Goal status: MET 10/26/23   LONG TERM GOALS: (target date: 01/18/2024)   1. Patient will demonstrate/report pain at worst less than or equal to 2/10 to facilitate minimal limitation in daily activity secondary to pain symptoms.  Goal status: Met 11/09/2023   2. Patient will demonstrate independent use of home exercise program to facilitate ability to maintain/progress functional gains from skilled physical therapy services. Goal status: Ongoing  12/07/2023   3. Patient will demonstrate Patient specific functional scale avg > or =  7 to indicate reduced disability due to condition.  Goal status: Ongoing  12/07/2023  4.  Patient will demonstrate LLE MMT 5/5 throughout to faciltiate usual transfers, stairs, squatting at [facility] for daily life.  Goal status: Ongoing  12/07/2023   5.  Patient will demonstrate/report ability to perform 18 inch chair transfer without UE assist.  Goal status: MET 11/09/2023   6.  Patient will demonstrate up and down a flight of stairs with single hand rail with reciprocal gait pattern.  Goal status: Ongoing  12/07/2023   7.  patient will demonstrate AROM 0-120 for full range of motion necessary to perform job tasks Goal Status: MET 11/09/23  8.  Improve Y balance test to > 90% composite score bil in order to work  towards return to [employer]. Goal Status: Ongoing  12/07/2023  PLAN:  PT FREQUENCY: 1x/wk  PT DURATION: 8 weeks  PLANNED INTERVENTIONS: Can include 02853- PT Re-evaluation, 97110-Therapeutic exercises, 97530- Therapeutic activity, 97112- Neuromuscular re-education, 97535- Self Care, 97140- Manual therapy, [phone]- Gait training, [phone]- Orthotic Fit/training, [phone]- Canalith repositioning, [id number]- Aquatic Therapy, [phone]- Electrical stimulation (unattended), [phone]- Electrical stimulation (manual), [id number] Physical performance testing, 97016- Vasopneumatic device, [id number]- Ultrasound, [id number]- Traction (mechanical), [id number]- Ionotophoresis 4mg /ml Dexamethasone , Blood Flow Restriction Therapy,  Patient/Family education, Balance training, Stair training, Taping, Dry Needling, Joint mobilization, Joint manipulation, Spinal manipulation, Spinal mobilization, Scar mobilization, Vestibular training, Visual/preceptual remediation/compensation, DME instructions, Cryotherapy, and Moist heat.  All performed as medically necessary.  All included unless contraindicated  PLAN FOR NEXT SESSION:  Check on lunge & slider exercise.  Continue with functional strength   Next MD visit: 01/11/2024     Corean JULIANNA Ku, PT, DPT 12/21/23 3:05 PM

## 2023-12-26 ENCOUNTER — Other Ambulatory Visit (HOSPITAL_COMMUNITY): Payer: Self-pay

## 2023-12-26 ENCOUNTER — Other Ambulatory Visit (HOSPITAL_BASED_OUTPATIENT_CLINIC_OR_DEPARTMENT_OTHER): Payer: Self-pay

## 2023-12-26 ENCOUNTER — Other Ambulatory Visit: Payer: Self-pay | Admitting: Family Medicine

## 2023-12-26 DIAGNOSIS — F341 Dysthymic disorder: Secondary | ICD-10-CM

## 2023-12-26 MED ORDER — ESCITALOPRAM OXALATE 20 MG PO TABS
20.0000 mg | ORAL_TABLET | Freq: Every day | ORAL | 0 refills | Status: DC
  Filled 2023-12-26: qty 90, 90d supply, fill #0

## 2023-12-28 ENCOUNTER — Ambulatory Visit: Admitting: Physical Therapy

## 2023-12-28 ENCOUNTER — Encounter: Payer: Self-pay | Admitting: Physical Therapy

## 2023-12-28 DIAGNOSIS — R2689 Other abnormalities of gait and mobility: Secondary | ICD-10-CM | POA: Diagnosis not present

## 2023-12-28 DIAGNOSIS — G8929 Other chronic pain: Secondary | ICD-10-CM

## 2023-12-28 DIAGNOSIS — M25562 Pain in left knee: Secondary | ICD-10-CM | POA: Diagnosis not present

## 2023-12-28 DIAGNOSIS — M25662 Stiffness of left knee, not elsewhere classified: Secondary | ICD-10-CM | POA: Diagnosis not present

## 2023-12-28 DIAGNOSIS — M6281 Muscle weakness (generalized): Secondary | ICD-10-CM | POA: Diagnosis not present

## 2023-12-28 DIAGNOSIS — R600 Localized edema: Secondary | ICD-10-CM | POA: Diagnosis not present

## 2023-12-28 NOTE — Therapy (Signed)
 OUTPATIENT PHYSICAL THERAPY LOWER EXTREMITY TREATMENT   Patient Name: Jasmine Ortega MRN: [id number] DOB:[birth year], 43 y.o., female Today's Date: 12/28/2023  END OF SESSION:  PT End of Session - 12/28/23 1427     Visit Number 17    Number of Visits 20    Date for PT Re-Evaluation 01/18/24    Authorization Type [employer] [employer]    Progress Note Due on Visit 20    PT Start Time 1427    PT Stop Time 1505    PT Time Calculation (min) 38 min    Activity Tolerance Patient tolerated treatment well;No increased pain    Behavior During Therapy WFL for tasks assessed/performed               Past Medical History:  Diagnosis Date   Birth control    Chicken pox    Depression    GERD (gastroesophageal reflux disease)    Gestational diabetes    Past Surgical History:  Procedure Laterality Date   ALLOGRAFT APPLICATION Left 09/13/2023   Procedure: BONE -PATELLA-TENDON-BONE ALLOGRAFT;  Surgeon: Addie Cordella Hamilton, MD;  Location: [facility] OR;  Service: Orthopedics;  Laterality: Left;   ANTERIOR CRUCIATE LIGAMENT REPAIR Left 09/13/2023   Procedure: LEFT KNEE ACL TEAR RECONSTRUCTION;  Surgeon: Addie Cordella Hamilton, MD;  Location: [facility] OR;  Service: Orthopedics;  Laterality: Left;   BONE MARROW ASPIRATION FOR SPINE FUSION ONLY (BMAC) Left 09/13/2023   Procedure: BONE MARROW ASPIRATE CONCENTRATE FROM ILLIAC CREST;  Surgeon: Addie Cordella Hamilton, MD;  Location: [facility] OR;  Service: Orthopedics;  Laterality: Left;   KNEE ARTHROSCOPY WITH MENISCAL REPAIR Left 09/13/2023   Procedure: MEDIAL MENISCAL TEAR REPAIR;  Surgeon: Addie Cordella Hamilton, MD;  Location: [facility] OR;  Service: Orthopedics;  Laterality: Left;   LASIK Bilateral    NO PAST SURGERIES     Patient Active Problem List   Diagnosis Date Noted   Left anterior cruciate ligament tear 09/18/2023   Acute medial meniscus tear of left knee 09/18/2023   Pain in left knee 08/03/2023   Chronic low back pain without sciatica 09/23/2021   Dysthymia 09/23/2021    Obesity (BMI 30-39.9) 09/14/2019   Hyperlipidemia 09/14/2019   Gastroesophageal reflux disease 04/27/2017   Attention deficit hyperactivity disorder (ADHD), predominantly inattentive type 03/05/2015   Loss of transverse plantar arch of right foot 09/18/2014    PCP: Joyce Norleen BROCKS, MD  REFERRING PROVIDER: Addie Cordella Hamilton, MD  REFERRING DIAG:  [id number] (ICD-10-CM) - Rupture of anterior cruciate ligament of left knee, initial encounter S83.242D (ICD-10-CM) - Other tear of medial meniscus, current injury, left knee, subsequent encounter  THERAPY DIAG:  Muscle weakness (generalized)  Chronic pain of left knee  Stiffness of left knee, not elsewhere classified  Other abnormalities of gait and mobility  Localized edema  Rationale for Evaluation and Treatment: Rehabilitation  ONSET DATE: 09/13/2023 s/p Lt ACL with medial meniscus  SUBJECTIVE:   SUBJECTIVE STATEMENT: The slider exercises are going well.  She tried lunge but did not feel right so has not done well.    PERTINENT HISTORY: 3/18 surgery with Left LEFT KNEE ACL TEAR RECONSTRUCTION , BONE MARROW ASPIRATE CONCENTRATE, Left MEDIAL MENISCAL TEAR REPAIR, Left BONE -PATELLA-TENDON-BONE ALLOGRAFT Chronic LBP, HLD, GERD, ADHD  No hx of DVT- ok to do BFR  PAIN:  NPRS scale:  0/10 today and over last week highest 2/10 (going down stairs right rail alternating)  Pain location: Lt knee Pain description: burning, numbness Aggravating factors: too much WB, prolonged postures,  walking end of day with muscle fatigue Relieving factors: ice, rest, elevation, pain meds- ibprophin, roboxin at bed time  PRECAUTIONS: Other:  ROM & strengthening closed chain; ok to WBAT in 7 days  WEIGHT BEARING RESTRICTIONS: ok to WBAT in 7 days per referral 09/28/2023. However will need clarification.   FALLS:  Has patient fallen in last 6 months? 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 No displaced fragment. Lateral meniscus:  Intact.   LIGAMENTS Cruciates:  The ACL is chronically torn.  The PCL is intact. Collaterals:  Intact.  CARTILAGE Patellofemoral:  Preserved. Medial:  Minimally degenerated. Lateral:  Minimally degenerated. Joint:  Small to moderate joint effusion. Popliteal Fossa:  No Baker's cyst. Extensor Mechanism:  Intact.   Bones: No fracture, stress change or worrisome lesion. Small enchondroma distal femur noted   Other: Subcutaneous edema anterior to the patella and patellar tendon compatible bursitis is seen.   IMPRESSION: 1. Longitudinal tear posterior horn and posterior body of the medial meniscus without displaced fragment. 2. Chronic ACL tear. 3. Mild medial and lateral compartment osteoarthritis. 4. Prepatellar bursitis.    PATIENT SURVEYS:  Patient-Specific Activity Scoring Scheme  0 represents "unable to perform." 10 represents "able to perform at prior level. 0 1 2 3 4 5 6 7 8 9  10 (Date and Score)   Activity Eval 09/22/23 11/09/23  11/23/23  1. Full AROM of knee 1   6 8   2. Improved knee stability  1  7 8   3. Return to jujitsu 1 0 0  4. stairs 1 1 4   5. walking 1 9 9   Score 1/10 4.6 5.8   Total score = sum of the activity scores/number of activities Minimum detectable change (90%CI) for average score = 2 points Minimum detectable change (90%CI) for single activity score = 3 points  COGNITION: Evaluation on 09/22/2023:   Overall cognitive status:  WFL    SENSATION: Evaluation on 09/22/2023:   WFL  EDEMA:  Evaluation on 09/22/2023:  Not measured but left knee is larger by observation.  She reports increased with activities.   POSTURE:  Evaluation on 09/22/2023:  No Significant postural limitations  PALPATION: Evaluation on 09/22/2023:  Tender to surgical sites and quadricep  LOWER EXTREMITY ROM:   ROM Left Eval 09/22/2023 Left  09/30/2023 Left 10/26/23  Knee flexion Supine with strap AA: 84 Active at 101 A: 120 (After heelslides 122)  Knee extension Supine quad set A: -7 Active -1    (Blank rows = not tested)  LOWER EXTREMITY MMT:  Lt not tested at eval due to surgery  MMT Right Eval 09/22/23 Left Eval 09/22/23 Left 11/09/23 HHD Left 11/23/23 HHD  Hip flexion 5 4    Hip extension      Hip abduction      Hip adduction      Hip internal rotation      Hip external rotation      Knee flexion 5 3-/5 21.2 ppsi 43.6#  Knee extension  5 3-/5 42.1 ppsi 56.1#  Ankle dorsiflexion 5 5    Ankle plantarflexion 5 as seen in gait     Ankle inversion      Ankle eversion       (Blank rows = not tested)  FUNCTIONAL TESTS:  Evaluation on 09/22/2023: Rises from 18in chair with BUE use on armrests and LLE slightly in front to decrease knee bending  GAIT: Evaluation on 09/22/2023: Distance walked: clinical Assistive device utilized: Crutches Level of assistance: SBA Comments: crutches too short, PWB on LLE using 3pt gait, decreased cadence, decreased step length                                                                                                                                                                        TODAY'S TREATMENT 12/28/2023  Therapeutic Exercise: Recumbent bike seat 3 level 4 for 4 min & level 7 for 4 min as warm-up (taking subjective).   Leg ext machine 5# BLE concentric & LLE isometric / eccentric 10 reps  Therapeutic Activities: Stairs descending right rail alternating pattern with pain at  peak knee flexion.  Ascending left rail alternating pattern with pain (less intense) on last couple of steps.   LLE knee TKE + SLS with blue theraband 10 reps Squat over chair LLE blue theraband resistance 10 reps Lunge bw 2 chairs alternating forward LE 10 reps ea.  Pt realized that she had tried with only one chair which PT advised may cause rotation.   Stepping down from 4 step - using mirror alternating LEs so LLE form close to RLE form.  Pt initially limited forward weight shift motion and Trendelenburg motion with LLE performing the motion.  She did improve with repetition and PT cues.  Progress to blue Thera-Band resistance anteriorly for 10 reps.  PT educated patient how to set up and try at home. Patient verbalized understanding of above activities for home after completing during PT session.    TREATMENT 12/21/23 TherEx Recumbent bike; L7 x 8 min  TherAct TRX lateral lunge and curtsy lunges 2x10 bil; on sliders SLS with multidirectional kick x 10 reps bil Single leg deadlift 15# x10 bil   12/07/2023  Therapeutic Exercise: Recumbent bike seat 3 level 4 for 8 min as warm-up (taking subjective). PT educated on seat position / leg length and resistance with both stationary bike designs.  Gastroc stretch incline board 20 sec hold 3 reps Heel raises BLEs without UE support 10 reps 2 sets.  Therapeutic Activities: BFR (cuff size 4; 168 mmHg)  leg press LLE only (back flat for quad motion hip/knee for standing) 50# 30-15-15-15 reps; 30 sec rest between sets Lunge using 2 chairs for UE support:  5 reps with each LE  lead.  PT demo & verbal cues on technique including how to work on outside of PT.  This is precursor for Jui jitsu. Pt verbalized understanding.   Stairs alternating pattern with 11 steps 2 times 1st rep descending  2 rails, 2nd rep contralateral rail;  ascending both reps contralateral rail.  No knee instability noted but slow guarded motions esp descending.  Slider 5  points (ant, ant-lat, lat, post-lat, lat) stance LE flexion on outward motion & extension on inward motion with UE support on sink prn: 3 reps with each LE.    11/23/23 TherEx: Recumbent bike L5 for 8 min as warm-up (taking subjective) MMT - see above for details  TherAct Leg press 75# LLE only 3x10  Physical Performance Test Y Balance Test:  Leg Length 32.75 bil RLE:  Anterior: 22.17  Posterior Medial: 34  Posterior Lateral: 29.67 LLE:   Anterior: 20.83  Posterior Medial: 31.33  Posterior Lateral: 31.67 Composite Score:  Right: 87%  Left: 85%    PATIENT EDUCATION:  Education details: HEP, POC Person educated: Patient Education method: Programmer, multimedia, Demonstration, Verbal cues, and Handouts Education comprehension: verbalized understanding, returned demonstration, and verbal cues required  HOME EXERCISE PROGRAM: Access Code: [email or web address] URL: [email or web address] Date: 09/30/2023 Prepared by: Lamar Ivory  Exercises - Ankle Alphabet in Elevation  - 2-3 x daily - 7 x weekly - 1 sets - 2-3 reps - elevate 10-15 min hold - Supine Active Straight Leg Raise (Mirrored)  - 2-3 x daily - 7 x weekly - 2 sets - 10 reps - 5 seconds hold - supine quad set with towel roll under ankle  - 2-3 x daily - 7 x weekly - 2 sets - 10 reps - 5 seconds hold - Supine Heel Slide with Strap  - 2-3 x daily - 7 x weekly - 2 sets - 10 reps - 5 seconds hold - seated single straight leg raise   - 2-3 x daily - 7 x weekly - 2 sets - 10 reps - 5 seconds hold - Seated Quad Set  - 2-3 x daily - 7 x weekly - 2 sets - 10 reps - 5 seconds hold - Seated Knee Flexion Extension AROM   - 2-3 x daily - 7 x weekly - 2 sets - 10 reps - 5 seconds hold - Prone Hip Extension  - 1 x daily - 7 x weekly - 2 sets - 10 reps - 3 seconds hold  ASSESSMENT:  CLINICAL IMPRESSION PT updated HEP to include significant eccentric quadriceps strengthening activities.  Patient appears to understand updated HEP  progressing well but LLE still weaker than RLE needing continued PT.  OBJECTIVE IMPAIRMENTS: Abnormal gait, decreased activity tolerance, decreased balance, decreased coordination, decreased endurance, decreased knowledge of condition, decreased knowledge of use of DME, decreased mobility, difficulty walking, decreased ROM, decreased strength, increased edema, increased muscle spasms, and pain.   ACTIVITY LIMITATIONS: carrying, lifting, bending, sitting, standing, squatting, stairs, and locomotion level  PARTICIPATION LIMITATIONS: meal prep, cleaning, laundry, driving, shopping, community activity, and occupation  PERSONAL FACTORS: Profession, Time since onset of injury/illness/exacerbation, and 1-2 comorbidities: see above are also affecting patient's functional outcome.   REHAB POTENTIAL: Good  CLINICAL DECISION MAKING: Stable/uncomplicated  EVALUATION COMPLEXITY: Low   GOALS: Goals reviewed with patient? Yes  SHORT TERM GOALS: (target date for Short term goals are 3 weeks 10/13/2023)   1.  Patient will demonstrate independent use of home exercise program to maintain progress from in clinic treatments. Goal status: MET 10/26/23  2.  Patient will demonstrate AROM 0-95deg  Goal status: MET 10/26/23   LONG TERM GOALS: (target date: 01/18/2024)   1. Patient will demonstrate/report pain at worst less than or equal to 2/10 to facilitate minimal limitation in daily activity secondary to pain symptoms.  Goal status: Met 11/09/2023   2. Patient will demonstrate independent use of home exercise program to facilitate ability to maintain/progress functional gains from skilled physical therapy services. Goal status: Ongoing   12/28/2023   3. Patient will demonstrate Patient specific functional scale avg > or = 7 to indicate reduced disability due to condition.  Goal status: Ongoing   12/28/2023  4.  Patient will demonstrate LLE MMT 5/5 throughout to faciltiate usual transfers, stairs, squatting  at [facility] for daily life.  Goal status: Ongoing   12/28/2023   5.  Patient will demonstrate/report ability to perform 18 inch chair transfer without UE assist.  Goal status: MET 11/09/2023   6.  Patient will demonstrate up and down a flight of stairs with single hand rail with reciprocal gait pattern.  Goal status: Ongoing   12/28/2023   7.  patient will demonstrate AROM 0-120 for full range of motion necessary to perform job tasks Goal Status: MET 11/09/23  8.  Improve Y balance test to > 90% composite score bil in order to work towards return to [employer]. Goal Status: Ongoing  12/28/2023  PLAN:  PT FREQUENCY: 1x/wk  PT DURATION: 8 weeks  PLANNED INTERVENTIONS: Can include 02853- PT Re-evaluation, 97110-Therapeutic exercises, 97530- Therapeutic activity, 97112- Neuromuscular re-education, 97535- Self Care, 97140- Manual therapy, [phone]- Gait training, [phone]- Orthotic Fit/training, [phone]- Canalith repositioning, [id number]- Aquatic Therapy, [phone]- Electrical stimulation (unattended), [phone]- Electrical stimulation (manual), [id number] Physical performance testing, 97016- Vasopneumatic device, [id number]- Ultrasound, [id number]- Traction (mechanical), [id number]- Ionotophoresis 4mg /ml Dexamethasone , Blood Flow Restriction Therapy,  Patient/Family education, Balance training, Stair training, Taping, Dry Needling, Joint mobilization, Joint manipulation, Spinal manipulation, Spinal mobilization, Scar mobilization, Vestibular training, Visual/preceptual remediation/compensation, DME instructions, Cryotherapy, and Moist heat.  All performed as medically necessary.  All included unless contraindicated  PLAN FOR NEXT SESSION:  Check on updated HEP and progress. continue with functional strength   Next MD visit: 01/11/2024   Grayce Spatz, PT, DPT 12/28/2023, 6:42 PM

## 2024-01-04 ENCOUNTER — Ambulatory Visit (INDEPENDENT_AMBULATORY_CARE_PROVIDER_SITE_OTHER): Admitting: Physical Therapy

## 2024-01-04 ENCOUNTER — Encounter: Payer: Self-pay | Admitting: Physical Therapy

## 2024-01-04 DIAGNOSIS — G8929 Other chronic pain: Secondary | ICD-10-CM

## 2024-01-04 DIAGNOSIS — F331 Major depressive disorder, recurrent, moderate: Secondary | ICD-10-CM | POA: Diagnosis not present

## 2024-01-04 DIAGNOSIS — M25662 Stiffness of left knee, not elsewhere classified: Secondary | ICD-10-CM

## 2024-01-04 DIAGNOSIS — M6281 Muscle weakness (generalized): Secondary | ICD-10-CM | POA: Diagnosis not present

## 2024-01-04 DIAGNOSIS — M25562 Pain in left knee: Secondary | ICD-10-CM

## 2024-01-04 DIAGNOSIS — R600 Localized edema: Secondary | ICD-10-CM | POA: Diagnosis not present

## 2024-01-04 DIAGNOSIS — R2689 Other abnormalities of gait and mobility: Secondary | ICD-10-CM

## 2024-01-04 NOTE — Therapy (Signed)
 OUTPATIENT PHYSICAL THERAPY LOWER EXTREMITY TREATMENT   Patient Name: Jasmine Ortega MRN: [id number] DOB:[birth year], 43 y.o., female Today's Date: 01/04/2024  END OF SESSION:  PT End of Session - 01/04/24 1510     Visit Number 18    Number of Visits 20    Date for PT Re-Evaluation 01/18/24    Authorization Type [employer]    Progress Note Due on Visit 20    PT Start Time 1510    PT Stop Time 1550    PT Time Calculation (min) 40 min    Activity Tolerance Patient tolerated treatment well;No increased pain    Behavior During Therapy WFL for tasks assessed/performed                Past Medical History:  Diagnosis Date   Birth control    Chicken pox    Depression    GERD (gastroesophageal reflux disease)    Gestational diabetes    Past Surgical History:  Procedure Laterality Date   ALLOGRAFT APPLICATION Left 09/13/2023   Procedure: BONE -PATELLA-TENDON-BONE ALLOGRAFT;  Surgeon: Addie Cordella Hamilton, MD;  Location: [facility] OR;  Service: Orthopedics;  Laterality: Left;   ANTERIOR CRUCIATE LIGAMENT REPAIR Left 09/13/2023   Procedure: LEFT KNEE ACL TEAR RECONSTRUCTION;  Surgeon: Addie Cordella Hamilton, MD;  Location: [facility] OR;  Service: Orthopedics;  Laterality: Left;   BONE MARROW ASPIRATION FOR SPINE FUSION ONLY (BMAC) Left 09/13/2023   Procedure: BONE MARROW ASPIRATE CONCENTRATE FROM ILLIAC CREST;  Surgeon: Addie Cordella Hamilton, MD;  Location: [facility] OR;  Service: Orthopedics;  Laterality: Left;   KNEE ARTHROSCOPY WITH MENISCAL REPAIR Left 09/13/2023   Procedure: MEDIAL MENISCAL TEAR REPAIR;  Surgeon: Addie Cordella Hamilton, MD;  Location: [facility] OR;  Service: Orthopedics;  Laterality: Left;   LASIK Bilateral    NO PAST SURGERIES     Patient Active Problem List   Diagnosis Date Noted   Left anterior cruciate ligament tear 09/18/2023   Acute medial meniscus tear of left knee 09/18/2023   Pain in left knee 08/03/2023   Chronic low back pain without sciatica 09/23/2021   Dysthymia 09/23/2021    Obesity (BMI 30-39.9) 09/14/2019   Hyperlipidemia 09/14/2019   Gastroesophageal reflux disease 04/27/2017   Attention deficit hyperactivity disorder (ADHD), predominantly inattentive type 03/05/2015   Loss of transverse plantar arch of right foot 09/18/2014    PCP: Joyce Norleen BROCKS, MD  REFERRING PROVIDER: Addie Cordella Hamilton, MD  REFERRING DIAG:  [phone] (ICD-10-CM) - Rupture of anterior cruciate ligament of left knee, initial encounter S83.242D (ICD-10-CM) - Other tear of medial meniscus, current injury, left knee, subsequent encounter  THERAPY DIAG:  Muscle weakness (generalized)  Chronic pain of left knee  Stiffness of left knee, not elsewhere classified  Other abnormalities of gait and mobility  Localized edema  Rationale for Evaluation and Treatment: Rehabilitation  ONSET DATE: 09/13/2023 s/p Lt ACL with medial meniscus  SUBJECTIVE:   SUBJECTIVE STATEMENT: She has been doing better job with HEP.  She has been singing to get rhythm for gait. Going down stairs controlling motion as PT recommended.   PERTINENT HISTORY: 3/18 surgery with Left LEFT KNEE ACL TEAR RECONSTRUCTION , BONE MARROW ASPIRATE CONCENTRATE, Left MEDIAL MENISCAL TEAR REPAIR, Left BONE -PATELLA-TENDON-BONE ALLOGRAFT Chronic LBP, HLD, GERD, ADHD  No hx of DVT- ok to do BFR  PAIN:  NPRS scale:   0/10 today and over last week highest 1/10 (going down stairs right rail alternating)  Pain location: Lt knee Pain description: burning, numbness  Aggravating factors: too much WB, prolonged postures, walking end of day with muscle fatigue Relieving factors: ice, rest, elevation, pain meds- ibprophin, roboxin at bed time  PRECAUTIONS: Other:  ROM & strengthening closed chain; ok to WBAT in 7 days  WEIGHT BEARING RESTRICTIONS: ok to WBAT in 7 days per referral 09/28/2023. However will need clarification.   FALLS:  Has patient fallen in last 6 months? 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 No displaced fragment. Lateral meniscus:  Intact.   LIGAMENTS Cruciates:  The ACL is chronically torn.  The PCL is intact. Collaterals:  Intact.  CARTILAGE Patellofemoral:  Preserved. Medial:  Minimally degenerated. Lateral:  Minimally degenerated. Joint:  Small to moderate joint effusion. Popliteal Fossa:  No Baker's cyst. Extensor Mechanism:  Intact.   Bones: No fracture, stress change or worrisome lesion. Small enchondroma distal femur noted   Other: Subcutaneous edema anterior to the patella and patellar tendon compatible bursitis is seen.   IMPRESSION: 1. Longitudinal tear posterior horn and posterior body of the medial meniscus without displaced fragment. 2. Chronic ACL tear. 3. Mild medial and lateral compartment osteoarthritis. 4. Prepatellar bursitis.    PATIENT SURVEYS:  Patient-Specific Activity Scoring Scheme  0 represents "unable to perform." 10 represents "able to perform at prior level. 0 1 2 3 4 5 6 7 8 9  10 (Date and Score)   Activity Eval 09/22/23 11/09/23  11/23/23  1. Full AROM of knee 1   6 8   2. Improved knee stability  1  7 8   3. Return to jujitsu 1 0 0  4. stairs 1 1 4   5. walking 1 9 9   Score 1/10 4.6 5.8   Total score = sum of the activity scores/number of activities Minimum detectable change (90%CI) for average score = 2 points Minimum detectable change (90%CI) for single activity score = 3 points  COGNITION: Evaluation on 09/22/2023:    Overall cognitive status: WFL    SENSATION: Evaluation on 09/22/2023:   WFL  EDEMA:  Evaluation on 09/22/2023:  Not measured but left knee is larger by observation.  She reports increased with activities.   POSTURE:  Evaluation on 09/22/2023:  No Significant postural limitations  PALPATION: Evaluation on 09/22/2023:  Tender to surgical sites and quadricep  LOWER EXTREMITY ROM:   ROM Left Eval 09/22/2023 Left  09/30/2023 Left 10/26/23  Knee flexion Supine with strap AA: 84 Active at 101 A: 120 (After heelslides 122)  Knee extension Supine quad set A: -7 Active -1    (Blank rows = not tested)  LOWER EXTREMITY MMT:  Lt not tested at eval due to surgery  MMT Right Eval 09/22/23 Left Eval 09/22/23 Left 11/09/23 HHD Left 11/23/23 HHD  Hip flexion 5 4    Hip extension      Hip abduction      Hip adduction      Hip internal rotation      Hip external rotation      Knee flexion 5  3-/5 21.2 ppsi 43.6#  Knee extension 5 3-/5 42.1 ppsi 56.1#  Ankle dorsiflexion 5 5    Ankle plantarflexion 5 as seen in gait     Ankle inversion      Ankle eversion       (Blank rows = not tested)  FUNCTIONAL TESTS:  Evaluation on 09/22/2023: Rises from 18in chair with BUE use on armrests and LLE slightly in front to decrease knee bending  GAIT: Evaluation on 09/22/2023: Distance walked: clinical Assistive device utilized: Crutches Level of assistance: SBA Comments: crutches too short, PWB on LLE using 3pt gait, decreased cadence, decreased step length                                                                                                                                                                        TODAY'S TREATMENT 01/04/2024  Therapeutic Exercise: Recumbent bike seat 3 level 5 for 8 min Quad stretch 20 sec hold 2 reps standing knee flexion Leg ext machine 5# BLE concentric & LLE isometric / eccentric 10 reps 2 sets Quad stretch standing with towel 20 sec hold 2  reps Gastroc stretch step heel depression 20 sec hold 2 reps Hamstring stretch foot on 2nd step 20 sec hold 2 reps Modified mountain climber with counter support 10 reps.  PT demo & verbal on technique PT educated patient on need for flexibility, strength, balance and muscle endurance built into her HEP.  If she has to choose due to time constraints on strength training versus muscle endurance at the gym PT recommends strength first.  Patient verbalized understanding.  Patient appears to understand stretches for flexibility noted above and rationale to prevent changes in functional activities being limited by range.  Therapeutic Activities: Stairs alternating pattern with single rail with improved weight shift knee control.  Lunge between 2 chairs B UE support PT cues on increasing step length and powering out of lunge with BLEs.  Patient able to return demonstration and verbalized understanding for HEP to increase power of motion. Large zigzag steps working on medial lateral knee stability.  Patient performed 5 reps with each direction 3 laps with improved knee control.  Patient does not appear ready for any Thera-Band resistance for this activity at this time.    TREATMENT 12/28/2023  Therapeutic Exercise: Recumbent bike seat 3 level 4 for 4 min & level 7 for 4 min as warm-up (taking subjective).   Leg ext machine 5# BLE concentric & LLE isometric / eccentric 10 reps  Therapeutic Activities: Stairs descending right rail alternating pattern with pain at peak knee flexion.  Ascending left rail alternating pattern with pain (less intense) on last couple of steps.   LLE knee TKE + SLS with blue theraband 10  reps Squat over chair LLE blue theraband resistance 10 reps Lunge bw 2 chairs alternating forward LE 10 reps ea.  Pt realized that she had tried with only one chair which PT advised may cause rotation.   Stepping down from 4 step - using mirror alternating LEs so LLE form close to RLE form.   Pt initially limited forward weight shift motion and Trendelenburg motion with LLE performing the motion.  She did improve with repetition and PT cues.  Progress to blue Thera-Band resistance anteriorly for 10 reps.  PT educated patient how to set up and try at home. Patient verbalized understanding of above activities for home after completing during PT session.    TREATMENT 12/21/23 TherEx Recumbent bike; L7 x 8 min  TherAct TRX lateral lunge and curtsy lunges 2x10 bil; on sliders SLS with multidirectional kick x 10 reps bil Single leg deadlift 15# x10 bil   12/07/2023  Therapeutic Exercise: Recumbent bike seat 3 level 4 for 8 min as warm-up (taking subjective). PT educated on seat position / leg length and resistance with both stationary bike designs.  Gastroc stretch incline board 20 sec hold 3 reps Heel raises BLEs without UE support 10 reps 2 sets.  Therapeutic Activities: BFR (cuff size 4; 168 mmHg)  leg press LLE only (back flat for quad motion hip/knee for standing) 50# 30-15-15-15 reps; 30 sec rest between sets Lunge using 2 chairs for UE support:  5 reps with each LE lead.  PT demo & verbal cues on technique including how to work on outside of PT.  This is precursor for Jui jitsu. Pt verbalized understanding.   Stairs alternating pattern with 11 steps 2 times 1st rep descending  2 rails, 2nd rep contralateral rail;  ascending both reps contralateral rail.  No knee instability noted but slow guarded motions esp descending.  Slider 5 points (ant, ant-lat, lat, post-lat, lat) stance LE flexion on outward motion & extension on inward motion with UE support on sink prn: 3 reps with each LE.    11/23/23 TherEx: Recumbent bike L5 for 8 min as warm-up (taking subjective) MMT - see above for details  TherAct Leg press 75# LLE only 3x10  Physical Performance Test Y Balance Test:  Leg Length 32.75 bil RLE:  Anterior: 22.17  Posterior Medial: 34  Posterior Lateral:  29.67 LLE:   Anterior: 20.83  Posterior Medial: 31.33  Posterior Lateral: 31.67 Composite Score:  Right: 87%  Left: 85%    PATIENT EDUCATION:  Education details: HEP, POC Person educated: Patient Education method: Programmer, multimedia, Demonstration, Verbal cues, and Handouts Education comprehension: verbalized understanding, returned demonstration, and verbal cues required  HOME EXERCISE PROGRAM: Access Code: [email or web address] URL: [email or web address] Date: 09/30/2023 Prepared by: Lamar Ivory  Exercises - Ankle Alphabet in Elevation  - 2-3 x daily - 7 x weekly - 1 sets - 2-3 reps - elevate 10-15 min hold - Supine Active Straight Leg Raise (Mirrored)  - 2-3 x daily - 7 x weekly - 2 sets - 10 reps - 5 seconds hold - supine quad set with towel roll under ankle  - 2-3 x daily - 7 x weekly - 2 sets - 10 reps - 5 seconds hold - Supine Heel Slide with Strap  - 2-3 x daily - 7 x weekly - 2 sets - 10 reps - 5 seconds hold - seated single straight leg raise   - 2-3 x daily - 7 x weekly - 2 sets - 10 reps - 5 seconds hold -  Seated Quad Set  - 2-3 x daily - 7 x weekly - 2 sets - 10 reps - 5 seconds hold - Seated Knee Flexion Extension AROM   - 2-3 x daily - 7 x weekly - 2 sets - 10 reps - 5 seconds hold - Prone Hip Extension  - 1 x daily - 7 x weekly - 2 sets - 10 reps - 3 seconds hold  ASSESSMENT:  CLINICAL IMPRESSION PT instructed patient in muscle flexibility exercises to be added to HEP which she appears to understand including rationale.  Patient was able to improve power type functional activities with repetition and instruction.  Patient appears on target to meet LTG's by end of plan of care.  OBJECTIVE IMPAIRMENTS: Abnormal gait, decreased activity tolerance, decreased balance, decreased coordination, decreased endurance, decreased knowledge of condition, decreased knowledge of use of DME, decreased mobility, difficulty walking, decreased ROM, decreased strength, increased  edema, increased muscle spasms, and pain.   ACTIVITY LIMITATIONS: carrying, lifting, bending, sitting, standing, squatting, stairs, and locomotion level  PARTICIPATION LIMITATIONS: meal prep, cleaning, laundry, driving, shopping, community activity, and occupation  PERSONAL FACTORS: Profession, Time since onset of injury/illness/exacerbation, and 1-2 comorbidities: see above are also affecting patient's functional outcome.   REHAB POTENTIAL: Good  CLINICAL DECISION MAKING: Stable/uncomplicated  EVALUATION COMPLEXITY: Low   GOALS: Goals reviewed with patient? Yes  SHORT TERM GOALS: (target date for Short term goals are 3 weeks 10/13/2023)   1.  Patient will demonstrate independent use of home exercise program to maintain progress from in clinic treatments. Goal status: MET 10/26/23  2.  Patient will demonstrate AROM 0-95deg  Goal status: MET 10/26/23   LONG TERM GOALS: (target date: 01/18/2024)   1. Patient will demonstrate/report pain at worst less than or equal to 2/10 to facilitate minimal limitation in daily activity secondary to pain symptoms.  Goal status: Met 11/09/2023   2. Patient will demonstrate independent use of home exercise program to facilitate ability to maintain/progress functional gains from skilled physical therapy services. Goal status: Ongoing   01/04/2024   3. Patient will demonstrate Patient specific functional scale avg > or = 7 to indicate reduced disability due to condition.  Goal status: Ongoing   01/04/2024  4.  Patient will demonstrate LLE MMT 5/5 throughout to faciltiate usual transfers, stairs, squatting at [facility] for daily life.  Goal status: Ongoing   01/04/2024   5.  Patient will demonstrate/report ability to perform 18 inch chair transfer without UE assist.  Goal status: MET 11/09/2023   6.  Patient will demonstrate up and down a flight of stairs with single hand rail with reciprocal gait pattern.  Goal status: Ongoing   01/04/2024   7.  patient will  demonstrate AROM 0-120 for full range of motion necessary to perform job tasks Goal Status: MET 11/09/23  8.  Improve Y balance test to > 90% composite score bil in order to work towards return to [employer]. Goal Status: Ongoing  01/04/2024  PLAN:  PT FREQUENCY: 1x/wk  PT DURATION: 8 weeks  PLANNED INTERVENTIONS: Can include 02853- PT Re-evaluation, 97110-Therapeutic exercises, 97530- Therapeutic activity, 97112- Neuromuscular re-education, 97535- Self Care, 97140- Manual therapy, [phone]- Gait training, [phone]- Orthotic Fit/training, [phone]- Canalith repositioning, [id number]- Aquatic Therapy, [phone]- Electrical stimulation (unattended), [phone]- Electrical stimulation (manual), [id number] Physical performance testing, 97016- Vasopneumatic device, [id number]- Ultrasound, [id number]- Traction (mechanical), [id number]- Ionotophoresis 4mg /ml Dexamethasone , Blood Flow Restriction Therapy,  Patient/Family education, Balance training, Stair training, Taping, Dry Needling, Joint mobilization, Joint manipulation, Spinal manipulation, Spinal  mobilization, Scar mobilization, Vestibular training, Visual/preceptual remediation/compensation, DME instructions, Cryotherapy, and Moist heat.  All performed as medically necessary.  All included unless contraindicated  PLAN FOR NEXT SESSION:  Continue to check on updated HEP and progress. continue with functional strength.  Check if she has not been able to try jujitsu.  Send progress note to MD. Next MD visit: 01/11/2024   Grayce Spatz, PT, DPT 01/04/2024, 4:10 PM

## 2024-01-11 ENCOUNTER — Encounter: Payer: Self-pay | Admitting: Physical Therapy

## 2024-01-11 ENCOUNTER — Ambulatory Visit: Admitting: Physical Therapy

## 2024-01-11 ENCOUNTER — Ambulatory Visit: Admitting: Orthopedic Surgery

## 2024-01-11 DIAGNOSIS — M25562 Pain in left knee: Secondary | ICD-10-CM | POA: Diagnosis not present

## 2024-01-11 DIAGNOSIS — R600 Localized edema: Secondary | ICD-10-CM

## 2024-01-11 DIAGNOSIS — R2689 Other abnormalities of gait and mobility: Secondary | ICD-10-CM

## 2024-01-11 DIAGNOSIS — M25662 Stiffness of left knee, not elsewhere classified: Secondary | ICD-10-CM | POA: Diagnosis not present

## 2024-01-11 DIAGNOSIS — M6281 Muscle weakness (generalized): Secondary | ICD-10-CM | POA: Diagnosis not present

## 2024-01-11 DIAGNOSIS — G8929 Other chronic pain: Secondary | ICD-10-CM

## 2024-01-11 NOTE — Therapy (Signed)
 OUTPATIENT PHYSICAL THERAPY LOWER EXTREMITY TREATMENT   Patient Name: Jasmine Ortega MRN: [id number] DOB:1980/07/18, 43 y.o., female Today's Date: 01/11/2024  END OF SESSION:  PT End of Session - 01/11/24 1528     Visit Number 19    Number of Visits 20    Date for PT Re-Evaluation 01/18/24    Authorization Type [employer]    Progress Note Due on Visit 20    PT Start Time 1429    PT Stop Time 1510    PT Time Calculation (min) 41 min    Activity Tolerance Patient tolerated treatment well;No increased pain    Behavior During Therapy WFL for tasks assessed/performed                 Past Medical History:  Diagnosis Date   Birth control    Chicken pox    Depression    GERD (gastroesophageal reflux disease)    Gestational diabetes    Past Surgical History:  Procedure Laterality Date   ALLOGRAFT APPLICATION Left 09/13/2023   Procedure: BONE -PATELLA-TENDON-BONE ALLOGRAFT;  Surgeon: Addie Cordella Hamilton, MD;  Location: [facility] OR;  Service: Orthopedics;  Laterality: Left;   ANTERIOR CRUCIATE LIGAMENT REPAIR Left 09/13/2023   Procedure: LEFT KNEE ACL TEAR RECONSTRUCTION;  Surgeon: Addie Cordella Hamilton, MD;  Location: [facility] OR;  Service: Orthopedics;  Laterality: Left;   BONE MARROW ASPIRATION FOR SPINE FUSION ONLY (BMAC) Left 09/13/2023   Procedure: BONE MARROW ASPIRATE CONCENTRATE FROM ILLIAC CREST;  Surgeon: Addie Cordella Hamilton, MD;  Location: [facility] OR;  Service: Orthopedics;  Laterality: Left;   KNEE ARTHROSCOPY WITH MENISCAL REPAIR Left 09/13/2023   Procedure: MEDIAL MENISCAL TEAR REPAIR;  Surgeon: Addie Cordella Hamilton, MD;  Location: [facility] OR;  Service: Orthopedics;  Laterality: Left;   LASIK Bilateral    NO PAST SURGERIES     Patient Active Problem List   Diagnosis Date Noted   Left anterior cruciate ligament tear 09/18/2023   Acute medial meniscus tear of left knee 09/18/2023   Pain in left knee 08/03/2023   Chronic low back pain without sciatica 09/23/2021   Dysthymia  09/23/2021   Obesity (BMI 30-39.9) 09/14/2019   Hyperlipidemia 09/14/2019   Gastroesophageal reflux disease 04/27/2017   Attention deficit hyperactivity disorder (ADHD), predominantly inattentive type 03/05/2015   Loss of transverse plantar arch of right foot 09/18/2014    PCP: Joyce Norleen BROCKS, MD  REFERRING PROVIDER: Addie Cordella Hamilton, MD  REFERRING DIAG:  [phone] (ICD-10-CM) - Rupture of anterior cruciate ligament of left knee, initial encounter S83.242D (ICD-10-CM) - Other tear of medial meniscus, current injury, left knee, subsequent encounter  THERAPY DIAG:  Muscle weakness (generalized)  Chronic pain of left knee  Stiffness of left knee, not elsewhere classified  Other abnormalities of gait and mobility  Localized edema  Rationale for Evaluation and Treatment: Rehabilitation  ONSET DATE: 09/13/2023 s/p Lt ACL with medial meniscus  SUBJECTIVE:   SUBJECTIVE STATEMENT: She has been doing better job with HEP.  She has been singing to get rhythm for gait. Going down stairs controlling motion as PT recommended.   PERTINENT HISTORY: 3/18 surgery with Left LEFT KNEE ACL TEAR RECONSTRUCTION , BONE MARROW ASPIRATE CONCENTRATE, Left MEDIAL MENISCAL TEAR REPAIR, Left BONE -PATELLA-TENDON-BONE ALLOGRAFT Chronic LBP, HLD, GERD, ADHD  No hx of DVT- ok to do BFR  PAIN:  NPRS scale:   0/10 today and over last week highest 1/10 (going down stairs right rail alternating)  Pain location: Lt knee Pain description: burning,  numbness Aggravating factors: too much WB, prolonged postures, walking end of day with muscle fatigue Relieving factors: ice, rest, elevation, pain meds- ibprophin, roboxin at bed time  PRECAUTIONS: Other:  ROM & strengthening closed chain; ok to WBAT in 7 days  WEIGHT BEARING RESTRICTIONS: ok to WBAT in 7 days per referral 09/28/2023. However will need clarification.   FALLS:  Has patient fallen in last 6 months? 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 No displaced fragment. Lateral meniscus:  Intact.   LIGAMENTS Cruciates:  The ACL is chronically torn.  The PCL is intact. Collaterals:  Intact.  CARTILAGE Patellofemoral:  Preserved. Medial:  Minimally degenerated. Lateral:  Minimally degenerated. Joint:  Small to moderate joint effusion. Popliteal Fossa:  No Baker's cyst. Extensor Mechanism:  Intact.   Bones: No fracture, stress change or worrisome lesion. Small enchondroma distal femur noted   Other: Subcutaneous edema anterior to the patella and patellar tendon compatible bursitis is seen.   IMPRESSION: 1. Longitudinal tear posterior horn and posterior body of the medial meniscus without displaced fragment. 2. Chronic ACL tear. 3. Mild medial and lateral compartment osteoarthritis. 4. Prepatellar bursitis.    PATIENT SURVEYS:  Patient-Specific Activity Scoring Scheme  0 represents "unable to perform." 10 represents "able to perform at prior level. 0 1 2 3 4 5 6 7 8 9  10 (Date and Score)   Activity Eval 09/22/23 11/09/23  11/23/23  1. Full AROM of knee 1   6 8   2. Improved knee stability  1  7 8   3. Return to jujitsu 1 0 0  4. stairs 1 1 4   5. walking 1 9 9   Score 1/10 4.6 5.8   Total score = sum of the activity scores/number of activities Minimum detectable change (90%CI) for average score = 2 points Minimum detectable change (90%CI) for single activity score = 3 points  COGNITION: Evaluation on  09/22/2023:   Overall cognitive status: WFL    SENSATION: Evaluation on 09/22/2023:   WFL  EDEMA:  Evaluation on 09/22/2023:  Not measured but left knee is larger by observation.  She reports increased with activities.   POSTURE:  Evaluation on 09/22/2023:  No Significant postural limitations  PALPATION: Evaluation on 09/22/2023:  Tender to surgical sites and quadricep  LOWER EXTREMITY ROM:   ROM Left Eval 09/22/2023 Left  09/30/2023 Left 10/26/23  Knee flexion Supine with strap AA: 84 Active at 101 A: 120 (After heelslides 122)  Knee extension Supine quad set A: -7 Active -1    (Blank rows = not tested)  LOWER EXTREMITY MMT:  Lt not tested at eval due to surgery  MMT Right Eval 09/22/23 Left Eval 09/22/23 Left 11/09/23 HHD Left 11/23/23 HHD  Hip flexion 5 4    Hip extension      Hip abduction      Hip adduction      Hip internal rotation      Hip external rotation      Knee flexion  5 3-/5 21.2 ppsi 43.6#  Knee extension 5 3-/5 42.1 ppsi 56.1#  Ankle dorsiflexion 5 5    Ankle plantarflexion 5 as seen in gait     Ankle inversion      Ankle eversion       (Blank rows = not tested)  FUNCTIONAL TESTS:  Evaluation on 09/22/2023: Rises from 18in chair with BUE use on armrests and LLE slightly in front to decrease knee bending  11/23/23 Y Balance Test:  Leg Length 32.75 bil RLE:  Anterior: 22.17  Posterior Medial: 34  Posterior Lateral: 29.67 LLE:   Anterior: 20.83  Posterior Medial: 31.33  Posterior Lateral: 31.67 Composite Score:  Right: 87%  Left: 85%  01/11/24 Y Balance Test:  Leg Length 32.75 bil RLE:  Anterior: 23.67  Posterior Medial: 36.33  Posterior Lateral: 32.33 LLE:   Anterior: 22.5  Posterior Medial: 36  Posterior Lateral: 30 Composite Score:  Right: 94%  Left: 90%  GAIT: Evaluation on 09/22/2023: Distance walked: clinical Assistive device utilized: Crutches Level of assistance: SBA Comments: crutches too short, PWB on  LLE using 3pt gait, decreased cadence, decreased step length                                                                                                                                                                        TODAY'S TREATMENT 01/11/24 TherEx Recumbent bike L6 x 8 min  Physical Performance Test Y Balance Test:  Leg Length 32.75 bil RLE:  Anterior: 23.67  Posterior Medial: 36.33  Posterior Lateral: 32.33 LLE:   Anterior: 22.5  Posterior Medial: 36  Posterior Lateral: 30 Composite Score:  Right: 94%  Left: 90%  TherAct TRX curtsy lunges 2x10 bil Deadlifts 35# on each cable 3x10 (standing on 8 block) Bil small jumps forward/backward working towards constant motion - able to perform 3-5 jumps continuously at this time with mild pain  01/04/2024  Therapeutic Exercise: Recumbent bike seat 3 level 5 for 8 min Quad stretch 20 sec hold 2 reps standing knee flexion Leg ext machine 5# BLE concentric & LLE isometric / eccentric 10 reps 2 sets Quad stretch standing with towel 20 sec hold 2 reps Gastroc stretch step heel depression 20 sec hold 2 reps Hamstring stretch foot on 2nd step 20 sec hold 2 reps Modified mountain climber with counter support 10 reps.  PT demo & verbal on technique PT educated patient on need for flexibility, strength, balance and muscle endurance built into her HEP.  If she has to choose due to time constraints on strength training versus muscle endurance at the gym PT recommends strength first.  Patient verbalized understanding.  Patient appears to understand stretches for flexibility noted above and rationale to prevent changes in functional  activities being limited by range.  Therapeutic Activities: Stairs alternating pattern with single rail with improved weight shift knee control.  Lunge between 2 chairs B UE support PT cues on increasing step length and powering out of lunge with BLEs.  Patient able to return demonstration and  verbalized understanding for HEP to increase power of motion. Large zigzag steps working on medial lateral knee stability.  Patient performed 5 reps with each direction 3 laps with improved knee control.  Patient does not appear ready for any Thera-Band resistance for this activity at this time.    TREATMENT 12/28/2023  Therapeutic Exercise: Recumbent bike seat 3 level 4 for 4 min & level 7 for 4 min as warm-up (taking subjective).   Leg ext machine 5# BLE concentric & LLE isometric / eccentric 10 reps  Therapeutic Activities: Stairs descending right rail alternating pattern with pain at peak knee flexion.  Ascending left rail alternating pattern with pain (less intense) on last couple of steps.   LLE knee TKE + SLS with blue theraband 10 reps Squat over chair LLE blue theraband resistance 10 reps Lunge bw 2 chairs alternating forward LE 10 reps ea.  Pt realized that she had tried with only one chair which PT advised may cause rotation.   Stepping down from 4 step - using mirror alternating LEs so LLE form close to RLE form.  Pt initially limited forward weight shift motion and Trendelenburg motion with LLE performing the motion.  She did improve with repetition and PT cues.  Progress to blue Thera-Band resistance anteriorly for 10 reps.  PT educated patient how to set up and try at home. Patient verbalized understanding of above activities for home after completing during PT session.    TREATMENT 12/21/23 TherEx Recumbent bike; L7 x 8 min  TherAct TRX lateral lunge and curtsy lunges 2x10 bil; on sliders SLS with multidirectional kick x 10 reps bil Single leg deadlift 15# x10 bil   12/07/2023  Therapeutic Exercise: Recumbent bike seat 3 level 4 for 8 min as warm-up (taking subjective). PT educated on seat position / leg length and resistance with both stationary bike designs.  Gastroc stretch incline board 20 sec hold 3 reps Heel raises BLEs without UE support 10 reps 2  sets.  Therapeutic Activities: BFR (cuff size 4; 168 mmHg)  leg press LLE only (back flat for quad motion hip/knee for standing) 50# 30-15-15-15 reps; 30 sec rest between sets Lunge using 2 chairs for UE support:  5 reps with each LE lead.  PT demo & verbal cues on technique including how to work on outside of PT.  This is precursor for Jui jitsu. Pt verbalized understanding.   Stairs alternating pattern with 11 steps 2 times 1st rep descending  2 rails, 2nd rep contralateral rail;  ascending both reps contralateral rail.  No knee instability noted but slow guarded motions esp descending.  Slider 5 points (ant, ant-lat, lat, post-lat, lat) stance LE flexion on outward motion & extension on inward motion with UE support on sink prn: 3 reps with each LE.   PATIENT EDUCATION:  Education details: HEP, POC Person educated: Patient Education method: Programmer, multimedia, Demonstration, Verbal cues, and Handouts Education comprehension: verbalized understanding, returned demonstration, and verbal cues required  HOME EXERCISE PROGRAM: Access Code: [email or web address] URL: [email or web address] Date: 09/30/2023 Prepared by: Lamar Ivory  Exercises - Ankle Alphabet in Elevation  - 2-3 x daily - 7 x weekly - 1 sets - 2-3 reps - elevate 10-15 min hold -  Supine Active Straight Leg Raise (Mirrored)  - 2-3 x daily - 7 x weekly - 2 sets - 10 reps - 5 seconds hold - supine quad set with towel roll under ankle  - 2-3 x daily - 7 x weekly - 2 sets - 10 reps - 5 seconds hold - Supine Heel Slide with Strap  - 2-3 x daily - 7 x weekly - 2 sets - 10 reps - 5 seconds hold - seated single straight leg raise   - 2-3 x daily - 7 x weekly - 2 sets - 10 reps - 5 seconds hold - Seated Quad Set  - 2-3 x daily - 7 x weekly - 2 sets - 10 reps - 5 seconds hold - Seated Knee Flexion Extension AROM   - 2-3 x daily - 7 x weekly - 2 sets - 10 reps - 5 seconds hold - Prone Hip Extension  - 1 x daily - 7 x weekly - 2 sets - 10  reps - 3 seconds hold  ASSESSMENT:  CLINICAL IMPRESSION Good improvement in Y balance test today with RLE at 94%, and LLE at 90% composite score.  Overall continues to progress well with PT, continue to work on strengthening and progress functional activities as allowed by MD at this point in recovery.  Continue skilled PT.   OBJECTIVE IMPAIRMENTS: Abnormal gait, decreased activity tolerance, decreased balance, decreased coordination, decreased endurance, decreased knowledge of condition, decreased knowledge of use of DME, decreased mobility, difficulty walking, decreased ROM, decreased strength, increased edema, increased muscle spasms, and pain.   ACTIVITY LIMITATIONS: carrying, lifting, bending, sitting, standing, squatting, stairs, and locomotion level  PARTICIPATION LIMITATIONS: meal prep, cleaning, laundry, driving, shopping, community activity, and occupation  PERSONAL FACTORS: Profession, Time since onset of injury/illness/exacerbation, and 1-2 comorbidities: see above are also affecting patient's functional outcome.   REHAB POTENTIAL: Good  CLINICAL DECISION MAKING: Stable/uncomplicated  EVALUATION COMPLEXITY: Low   GOALS: Goals reviewed with patient? Yes  SHORT TERM GOALS: (target date for Short term goals are 3 weeks 10/13/2023)   1.  Patient will demonstrate independent use of home exercise program to maintain progress from in clinic treatments. Goal status: MET 10/26/23  2.  Patient will demonstrate AROM 0-95deg  Goal status: MET 10/26/23   LONG TERM GOALS: (target date: 01/18/2024)   1. Patient will demonstrate/report pain at worst less than or equal to 2/10 to facilitate minimal limitation in daily activity secondary to pain symptoms.  Goal status: Met 11/09/2023   2. Patient will demonstrate independent use of home exercise program to facilitate ability to maintain/progress functional gains from skilled physical therapy services. Goal status: Ongoing   01/04/2024    3. Patient will demonstrate Patient specific functional scale avg > or = 7 to indicate reduced disability due to condition.  Goal status: Ongoing   01/04/2024  4.  Patient will demonstrate LLE MMT 5/5 throughout to faciltiate usual transfers, stairs, squatting at [facility] for daily life.  Goal status: Ongoing   01/04/2024   5.  Patient will demonstrate/report ability to perform 18 inch chair transfer without UE assist.  Goal status: MET 11/09/2023   6.  Patient will demonstrate up and down a flight of stairs with single hand rail with reciprocal gait pattern.  Goal status: Ongoing   01/04/2024   7.  patient will demonstrate AROM 0-120 for full range of motion necessary to perform job tasks Goal Status: MET 11/09/23  8.  Improve Y balance test to > 90%  composite score bil in order to work towards return to [employer]. Goal Status: Partially met (met on Rt) 01/11/24  PLAN:  PT FREQUENCY: 1x/wk  PT DURATION: 8 weeks  PLANNED INTERVENTIONS: Can include 02853- PT Re-evaluation, 97110-Therapeutic exercises, 97530- Therapeutic activity, 97112- Neuromuscular re-education, 97535- Self Care, 97140- Manual therapy, [phone]- Gait training, [phone]- Orthotic Fit/training, [phone]- Canalith repositioning, [id number]- Aquatic Therapy, [phone]- Electrical stimulation (unattended), [phone]- Electrical stimulation (manual), [id number] Physical performance testing, 97016- Vasopneumatic device, [id number]- Ultrasound, [id number]- Traction (mechanical), [id number]- Ionotophoresis 4mg /ml Dexamethasone , Blood Flow Restriction Therapy,  Patient/Family education, Balance training, Stair training, Taping, Dry Needling, Joint mobilization, Joint manipulation, Spinal manipulation, Spinal mobilization, Scar mobilization, Vestibular training, Visual/preceptual remediation/compensation, DME instructions, Cryotherapy, and Moist heat.  All performed as medically necessary.  All included unless contraindicated  PLAN FOR NEXT SESSION:  What did MD say?  Will need recert  next visit.  Thinking of continuing for a few more visits over the next couple of months to maximize function.   Next MD visit: 01/11/2024   Corean JULIANNA Ku, PT, DPT 01/11/2024, 3:31 PM

## 2024-01-12 ENCOUNTER — Encounter: Payer: Self-pay | Admitting: Orthopedic Surgery

## 2024-01-12 ENCOUNTER — Ambulatory Visit: Admitting: Orthopedic Surgery

## 2024-01-12 DIAGNOSIS — Z9889 Other specified postprocedural states: Secondary | ICD-10-CM

## 2024-01-12 NOTE — Progress Notes (Signed)
 Post-Op Visit Note   Patient: Jasmine Ortega           Date of Birth: August 09, 1980           MRN: [id number] Visit Date: 01/12/2024 PCP: Joyce Norleen BROCKS, MD   Assessment & Plan:  Chief Complaint:  Chief Complaint  Patient presents with   Left Knee - Routine Post Op     09/13/2023  left knee ACL reconstruction with bone patella tendon bone allograft with the MAC and medial meniscal debridement   Visit Diagnoses: No diagnosis found.  Plan: Rya is a 43 year old patient is now 4 months out left knee ACL reconstruction.  She has been doing well in physical therapy doing some lunges and step ups.  Still is having a little bit of difficulty going downstairs.  Has some occasional anterior pain.  No instability.  On exam she has mild effusion.  She has excellent range of motion.  Very stable graft with excellent endpoint after 2 mm anterior drawer and Lachman.  Was no Baker's cyst palpable in the back of the knee.  Plan at this time is to progress in physical therapy.  Okay for some sport specific type exercises.  Starting in 2 months.  Okay for elliptical.  I do not think she is much of a runner.  Come back in 4 months for clinical recheck and likely release to jujitsu about 1 to 2 months after that next clinic visit.  She will continue with therapy 1 time every 2 weeks just for progression of activities and exercises along with a home exercise program to continue with..  Follow-Up Instructions: No follow-ups on file.   Orders:  No orders of the defined types were placed in this encounter.  No orders of the defined types were placed in this encounter.   Imaging: No results found.  PMFS History: Patient Active Problem List   Diagnosis Date Noted   Left anterior cruciate ligament tear 09/18/2023   Acute medial meniscus tear of left knee 09/18/2023   Pain in left knee 08/03/2023   Chronic low back pain without sciatica 09/23/2021   Dysthymia 09/23/2021   Obesity (BMI 30-39.9) 09/14/2019    Hyperlipidemia 09/14/2019   Gastroesophageal reflux disease 04/27/2017   Attention deficit hyperactivity disorder (ADHD), predominantly inattentive type 03/05/2015   Loss of transverse plantar arch of right foot 09/18/2014   Past Medical History:  Diagnosis Date   Birth control    Chicken pox    Depression    GERD (gastroesophageal reflux disease)    Gestational diabetes     Family History  Problem Relation Age of Onset   Breast cancer Mother    Cancer Mother 79       Breast cancer   Neuropathy Father    Arthritis Father    Hyperlipidemia Father    Hypertension Father    Stroke Father    Migraines Sister    Mitral valve prolapse Sister    Stroke Maternal Grandmother    Miscarriages / Stillbirths Maternal Grandfather    Arthritis Paternal Grandmother    Heart disease Paternal Grandmother    Diabetes Paternal Grandfather     Past Surgical History:  Procedure Laterality Date   ALLOGRAFT APPLICATION Left 09/13/2023   Procedure: BONE -PATELLA-TENDON-BONE ALLOGRAFT;  Surgeon: Addie Cordella Hamilton, MD;  Location: [facility] OR;  Service: Orthopedics;  Laterality: Left;   ANTERIOR CRUCIATE LIGAMENT REPAIR Left 09/13/2023   Procedure: LEFT KNEE ACL TEAR RECONSTRUCTION;  Surgeon: Addie Cordella Hamilton,  MD;  Location: [facility] OR;  Service: Orthopedics;  Laterality: Left;   BONE MARROW ASPIRATION FOR SPINE FUSION ONLY (BMAC) Left 09/13/2023   Procedure: BONE MARROW ASPIRATE CONCENTRATE FROM ILLIAC CREST;  Surgeon: Addie Cordella Hamilton, MD;  Location: [facility] OR;  Service: Orthopedics;  Laterality: Left;   KNEE ARTHROSCOPY WITH MENISCAL REPAIR Left 09/13/2023   Procedure: MEDIAL MENISCAL TEAR REPAIR;  Surgeon: Addie Cordella Hamilton, MD;  Location: [facility] OR;  Service: Orthopedics;  Laterality: Left;   LASIK Bilateral    NO PAST SURGERIES     Social History   Occupational History   Not on file  Tobacco Use   Smoking status: Never   Smokeless tobacco: Never  Vaping Use   Vaping status: Never Used  Substance  and Sexual Activity   Alcohol use: Yes    Alcohol/week: 2.0 standard drinks of alcohol    Types: 2 Glasses of wine per week    Comment: occas   Drug use: No   Sexual activity: Yes

## 2024-01-18 ENCOUNTER — Encounter: Admitting: Physical Therapy

## 2024-01-25 ENCOUNTER — Ambulatory Visit: Admitting: Physical Therapy

## 2024-01-25 ENCOUNTER — Encounter: Payer: Self-pay | Admitting: Physical Therapy

## 2024-01-25 DIAGNOSIS — G8929 Other chronic pain: Secondary | ICD-10-CM | POA: Diagnosis not present

## 2024-01-25 DIAGNOSIS — M25562 Pain in left knee: Secondary | ICD-10-CM

## 2024-01-25 DIAGNOSIS — M6281 Muscle weakness (generalized): Secondary | ICD-10-CM

## 2024-01-25 DIAGNOSIS — R2689 Other abnormalities of gait and mobility: Secondary | ICD-10-CM

## 2024-01-25 DIAGNOSIS — M25662 Stiffness of left knee, not elsewhere classified: Secondary | ICD-10-CM

## 2024-01-25 DIAGNOSIS — R600 Localized edema: Secondary | ICD-10-CM | POA: Diagnosis not present

## 2024-01-25 NOTE — Therapy (Signed)
 OUTPATIENT PHYSICAL THERAPY LOWER EXTREMITY TREATMENT RECERTIFICATION   Patient Name: Jasmine Ortega MRN: [id number] DOB:1981-06-02, 43 y.o., female Today's Date: 01/25/2024  END OF SESSION:  PT End of Session - 01/25/24 1430     Visit Number 20    Number of Visits 26    Date for PT Re-Evaluation 04/18/24    Authorization Type [employer]    PT Start Time 1430    PT Stop Time 1510    PT Time Calculation (min) 40 min    Activity Tolerance Patient tolerated treatment well;No increased pain    Behavior During Therapy WFL for tasks assessed/performed                  Past Medical History:  Diagnosis Date   Birth control    Chicken pox    Depression    GERD (gastroesophageal reflux disease)    Gestational diabetes    Past Surgical History:  Procedure Laterality Date   ALLOGRAFT APPLICATION Left 09/13/2023   Procedure: BONE -PATELLA-TENDON-BONE ALLOGRAFT;  Surgeon: Addie Cordella Hamilton, MD;  Location: [facility] OR;  Service: Orthopedics;  Laterality: Left;   ANTERIOR CRUCIATE LIGAMENT REPAIR Left 09/13/2023   Procedure: LEFT KNEE ACL TEAR RECONSTRUCTION;  Surgeon: Addie Cordella Hamilton, MD;  Location: [facility] OR;  Service: Orthopedics;  Laterality: Left;   BONE MARROW ASPIRATION FOR SPINE FUSION ONLY (BMAC) Left 09/13/2023   Procedure: BONE MARROW ASPIRATE CONCENTRATE FROM ILLIAC CREST;  Surgeon: Addie Cordella Hamilton, MD;  Location: [facility] OR;  Service: Orthopedics;  Laterality: Left;   KNEE ARTHROSCOPY WITH MENISCAL REPAIR Left 09/13/2023   Procedure: MEDIAL MENISCAL TEAR REPAIR;  Surgeon: Addie Cordella Hamilton, MD;  Location: [facility] OR;  Service: Orthopedics;  Laterality: Left;   LASIK Bilateral    NO PAST SURGERIES     Patient Active Problem List   Diagnosis Date Noted   Left anterior cruciate ligament tear 09/18/2023   Acute medial meniscus tear of left knee 09/18/2023   Pain in left knee 08/03/2023   Chronic low back pain without sciatica 09/23/2021   Dysthymia 09/23/2021   Obesity  (BMI 30-39.9) 09/14/2019   Hyperlipidemia 09/14/2019   Gastroesophageal reflux disease 04/27/2017   Attention deficit hyperactivity disorder (ADHD), predominantly inattentive type 03/05/2015   Loss of transverse plantar arch of right foot 09/18/2014    PCP: Joyce Norleen BROCKS, MD  REFERRING PROVIDER: Addie Cordella Hamilton, MD  REFERRING DIAG:  [phone] (ICD-10-CM) - Rupture of anterior cruciate ligament of left knee, initial encounter S83.242D (ICD-10-CM) - Other tear of medial meniscus, current injury, left knee, subsequent encounter  THERAPY DIAG:  Muscle weakness (generalized) - Plan: PT plan of care cert/re-cert  Chronic pain of left knee - Plan: PT plan of care cert/re-cert  Stiffness of left knee, not elsewhere classified - Plan: PT plan of care cert/re-cert  Other abnormalities of gait and mobility - Plan: PT plan of care cert/re-cert  Localized edema - Plan: PT plan of care cert/re-cert  Rationale for Evaluation and Treatment: Rehabilitation  ONSET DATE: 09/13/2023 s/p Lt ACL with medial meniscus  SUBJECTIVE:   SUBJECTIVE STATEMENT: More stiff today, no pain Descending stairs is still a challenge   PERTINENT HISTORY: 3/18 surgery with Left LEFT KNEE ACL TEAR RECONSTRUCTION , BONE MARROW ASPIRATE CONCENTRATE, Left MEDIAL MENISCAL TEAR REPAIR, Left BONE -PATELLA-TENDON-BONE ALLOGRAFT Chronic LBP, HLD, GERD, ADHD  No hx of DVT- ok to do BFR  PAIN:  NPRS scale:   0/10 today Pain location: Lt knee Pain description: burning,  numbness Aggravating factors: too much WB, prolonged postures, walking end of day with muscle fatigue Relieving factors: ice, rest, elevation, pain meds- ibprophin, roboxin at bed time  PRECAUTIONS: Other:  ROM & strengthening closed chain; ok to WBAT in 7 days  WEIGHT BEARING RESTRICTIONS: ok to WBAT in 7 days per referral 09/28/2023. However will need clarification.   FALLS:  Has patient fallen in last 6 months? 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 No displaced fragment. Lateral meniscus:  Intact.   LIGAMENTS Cruciates:  The ACL is chronically torn.  The PCL is intact. Collaterals:  Intact.  CARTILAGE Patellofemoral:  Preserved. Medial:  Minimally degenerated. Lateral:  Minimally degenerated. Joint:  Small to moderate joint effusion. Popliteal Fossa:  No Baker's cyst. Extensor Mechanism:  Intact.   Bones: No fracture, stress change or worrisome lesion. Small enchondroma distal femur noted   Other: Subcutaneous edema anterior to the patella and patellar tendon compatible bursitis is seen.   IMPRESSION: 1. Longitudinal tear posterior horn and posterior body of the medial meniscus without displaced fragment. 2. Chronic ACL tear. 3. Mild medial and lateral compartment osteoarthritis. 4. Prepatellar bursitis.    PATIENT SURVEYS:  Patient-Specific Activity Scoring Scheme  0 represents "unable to perform." 10 represents "able to perform at prior level. 0 1 2 3 4 5 6 7 8 9  10 (Date and Score)   Activity Eval 09/22/23 11/09/23  11/23/23 01/25/24  1. Full AROM of knee 1   6 8 8   2. Improved knee stability  1  7 8 7   3. Return to jujitsu 1 0 0 1  4. stairs 1 1 4 6   5. walking 1 9 9 9   Score 1/10 4.6 5.8 6.2   Total score = sum of the activity scores/number of activities Minimum detectable change (90%CI) for average score = 2 points Minimum detectable change (90%CI) for single activity  score = 3 points  COGNITION: Evaluation on 09/22/2023:   Overall cognitive status: WFL    SENSATION: Evaluation on 09/22/2023:   WFL  EDEMA:  Evaluation on 09/22/2023:  Not measured but left knee is larger by observation.  She reports increased with activities.   POSTURE:  Evaluation on 09/22/2023:  No Significant postural limitations  PALPATION: Evaluation on 09/22/2023:  Tender to surgical sites and quadricep  LOWER EXTREMITY ROM:   ROM Left Eval 09/22/2023 Left  09/30/2023 Left 10/26/23  Knee flexion Supine with strap AA: 84 Active at 101 A: 120 (After heelslides 122)  Knee extension Supine quad set A: -7 Active -1    (Blank rows = not tested)  LOWER EXTREMITY MMT:  Lt not tested at eval due to surgery  MMT Right Eval 09/22/23 Left Eval 09/22/23 Left 11/09/23 HHD Left 11/23/23 HHD Right/Left 01/25/24  Hip flexion 5 4     Hip extension       Hip abduction       Hip adduction       Hip internal rotation  Hip external rotation       Knee flexion 5 3-/5 21.2 ppsi 43.6#   Knee extension 5 3-/5 42.1 ppsi 56.1# 94.15#/82.73 (88% of RLE)  Ankle dorsiflexion 5 5     Ankle plantarflexion 5 as seen in gait      Ankle inversion       Ankle eversion        (Blank rows = not tested)  FUNCTIONAL TESTS:  Evaluation on 09/22/2023: Rises from 18in chair with BUE use on armrests and LLE slightly in front to decrease knee bending  11/23/23 Y Balance Test:  Leg Length 32.75 bil RLE:  Anterior: 22.17  Posterior Medial: 34  Posterior Lateral: 29.67 LLE:   Anterior: 20.83  Posterior Medial: 31.33  Posterior Lateral: 31.67 Composite Score:  Right: 87%  Left: 85%  01/11/24 Y Balance Test:  Leg Length 32.75 bil RLE:  Anterior: 23.67  Posterior Medial: 36.33  Posterior Lateral: 32.33 LLE:   Anterior: 22.5  Posterior Medial: 36  Posterior Lateral: 30 Composite Score:  Right: 94%  Left: 90%  GAIT: Evaluation on 09/22/2023: Distance walked:  clinical Assistive device utilized: Crutches Level of assistance: SBA Comments: crutches too short, PWB on LLE using 3pt gait, decreased cadence, decreased step length                                                                                                                                                                        TODAY'S TREATMENT 01/25/24 TherEx Recumbent bike L10 x 8 min MMT with hand held dynamometry - see above for details Discussed/updated current HEP with trial of squat/calf raises and squat jumps, as well as curtsy lunge with hop to other side   01/11/24 TherEx Recumbent bike L6 x 8 min  Physical Performance Test Y Balance Test:  Leg Length 32.75 bil RLE:  Anterior: 23.67  Posterior Medial: 36.33  Posterior Lateral: 32.33 LLE:   Anterior: 22.5  Posterior Medial: 36  Posterior Lateral: 30 Composite Score:  Right: 94%  Left: 90%  TherAct TRX curtsy lunges 2x10 bil Deadlifts 35# on each cable 3x10 (standing on 8 block) Bil small jumps forward/backward working towards constant motion - able to perform 3-5 jumps continuously at this time with mild pain  01/04/2024  Therapeutic Exercise: Recumbent bike seat 3 level 5 for 8 min Quad stretch 20 sec hold 2 reps standing knee flexion Leg ext machine 5# BLE concentric & LLE isometric / eccentric 10 reps 2 sets Quad stretch standing with towel 20 sec hold 2 reps Gastroc stretch step heel depression 20 sec hold 2 reps Hamstring stretch foot on 2nd step 20 sec hold 2 reps Modified mountain climber with counter support 10 reps.  PT demo & verbal on  technique PT educated patient on need for flexibility, strength, balance and muscle endurance built into her HEP.  If she has to choose due to time constraints on strength training versus muscle endurance at the gym PT recommends strength first.  Patient verbalized understanding.  Patient appears to understand stretches for flexibility noted above and  rationale to prevent changes in functional activities being limited by range.  Therapeutic Activities: Stairs alternating pattern with single rail with improved weight shift knee control.  Lunge between 2 chairs B UE support PT cues on increasing step length and powering out of lunge with BLEs.  Patient able to return demonstration and verbalized understanding for HEP to increase power of motion. Large zigzag steps working on medial lateral knee stability.  Patient performed 5 reps with each direction 3 laps with improved knee control.  Patient does not appear ready for any Thera-Band resistance for this activity at this time.    TREATMENT 12/28/2023  Therapeutic Exercise: Recumbent bike seat 3 level 4 for 4 min & level 7 for 4 min as warm-up (taking subjective).   Leg ext machine 5# BLE concentric & LLE isometric / eccentric 10 reps  Therapeutic Activities: Stairs descending right rail alternating pattern with pain at peak knee flexion.  Ascending left rail alternating pattern with pain (less intense) on last couple of steps.   LLE knee TKE + SLS with blue theraband 10 reps Squat over chair LLE blue theraband resistance 10 reps Lunge bw 2 chairs alternating forward LE 10 reps ea.  Pt realized that she had tried with only one chair which PT advised may cause rotation.   Stepping down from 4 step - using mirror alternating LEs so LLE form close to RLE form.  Pt initially limited forward weight shift motion and Trendelenburg motion with LLE performing the motion.  She did improve with repetition and PT cues.  Progress to blue Thera-Band resistance anteriorly for 10 reps.  PT educated patient how to set up and try at home. Patient verbalized understanding of above activities for home after completing during PT session.  PATIENT EDUCATION:  Education details: HEP, POC Person educated: Patient Education method: Programmer, multimedia, Demonstration, Verbal cues, and Handouts Education comprehension:  verbalized understanding, returned demonstration, and verbal cues required  HOME EXERCISE PROGRAM: Access Code: [email or web address] URL: [email or web address] Date: 09/30/2023 Prepared by: Lamar Ivory  Exercises - Ankle Alphabet in Elevation  - 2-3 x daily - 7 x weekly - 1 sets - 2-3 reps - elevate 10-15 min hold - Supine Active Straight Leg Raise (Mirrored)  - 2-3 x daily - 7 x weekly - 2 sets - 10 reps - 5 seconds hold - supine quad set with towel roll under ankle  - 2-3 x daily - 7 x weekly - 2 sets - 10 reps - 5 seconds hold - Supine Heel Slide with Strap  - 2-3 x daily - 7 x weekly - 2 sets - 10 reps - 5 seconds hold - seated single straight leg raise   - 2-3 x daily - 7 x weekly - 2 sets - 10 reps - 5 seconds hold - Seated Quad Set  - 2-3 x daily - 7 x weekly - 2 sets - 10 reps - 5 seconds hold - Seated Knee Flexion Extension AROM   - 2-3 x daily - 7 x weekly - 2 sets - 10 reps - 5 seconds hold - Prone Hip Extension  - 1 x daily - 7 x weekly - 2 sets -  10 reps - 3 seconds hold  ASSESSMENT:  CLINICAL IMPRESSION Pt with good improvement in strength at this time.  Would recommend continued PT to progress strength program and work into more sport specific activities as allowed.    OBJECTIVE IMPAIRMENTS: Abnormal gait, decreased activity tolerance, decreased balance, decreased coordination, decreased endurance, decreased knowledge of condition, decreased knowledge of use of DME, decreased mobility, difficulty walking, decreased ROM, decreased strength, increased edema, increased muscle spasms, and pain.   ACTIVITY LIMITATIONS: carrying, lifting, bending, sitting, standing, squatting, stairs, and locomotion level  PARTICIPATION LIMITATIONS: meal prep, cleaning, laundry, driving, shopping, community activity, and occupation  PERSONAL FACTORS: Profession, Time since onset of injury/illness/exacerbation, and 1-2 comorbidities: see above are also affecting patient's functional outcome.    REHAB POTENTIAL: Good  CLINICAL DECISION MAKING: Stable/uncomplicated  EVALUATION COMPLEXITY: Low   GOALS: Goals reviewed with patient? Yes  SHORT TERM GOALS: (target date for Short term goals are 3 weeks 10/13/2023)   1.  Patient will demonstrate independent use of home exercise program to maintain progress from in clinic treatments. Goal status: MET 10/26/23  2.  Patient will demonstrate AROM 0-95deg  Goal status: MET 10/26/23   LONG TERM GOALS: (target date: 04/18/2024)   1. Patient will demonstrate/report pain at worst less than or equal to 2/10 to facilitate minimal limitation in daily activity secondary to pain symptoms.  Goal status: Met 11/09/2023   2. Patient will demonstrate independent use of home exercise program to facilitate ability to maintain/progress functional gains from skilled physical therapy services. Goal status: ONGOING (continue goal ) 01/25/24   3. Patient will demonstrate Patient specific functional scale avg > or = 7 to indicate reduced disability due to condition.  Goal status: ONGOING (continue goal ) 01/25/24  4.  Patient will demonstrate LLE MMT 5/5 throughout to faciltiate usual transfers, stairs, squatting at [facility] for daily life.  Goal status: MET 01/25/24   5.  Patient will demonstrate/report ability to perform 18 inch chair transfer without UE assist.  Goal status: MET 11/09/2023   6.  Patient will demonstrate up and down a flight of stairs with single hand rail with reciprocal gait pattern.  Goal status: ONGOING (continue goal ) 01/25/24   7.  patient will demonstrate AROM 0-120 for full range of motion necessary to perform job tasks Goal Status: MET 11/09/23  8.  Improve Y balance test to > 90% composite score bil in order to work towards return to [employer]. Goal Status: Partially met (met on Rt) 01/11/24 (7/30 - continue goal)  9. LLE quad and hamstring strength improved to at least 93% of RLE for improved strength and participate in sport  specific activities Goal Status: INITIAL  PLAN:  PT FREQUENCY:  Every other week  PT DURATION: 12 weeks  PLANNED INTERVENTIONS: Can include 02853- PT Re-evaluation, 97110-Therapeutic exercises, 97530- Therapeutic activity, 97112- Neuromuscular re-education, 97535- Self Care, 97140- Manual therapy, [phone]- Gait training, [phone]- Orthotic Fit/training, [phone]- Canalith repositioning, [id number]- Aquatic Therapy, [phone]- Electrical stimulation (unattended), [phone]- Electrical stimulation (manual), [id number] Physical performance testing, 97016- Vasopneumatic device, [id number]- Ultrasound, [id number]- Traction (mechanical), [id number]- Ionotophoresis 4mg /ml Dexamethasone , Blood Flow Restriction Therapy,  Patient/Family education, Balance training, Stair training, Taping, Dry Needling, Joint mobilization, Joint manipulation, Spinal manipulation, Spinal mobilization, Scar mobilization, Vestibular training, Visual/preceptual remediation/compensation, DME instructions, Cryotherapy, and Moist heat.  All performed as medically necessary.  All included unless contraindicated  PLAN FOR NEXT SESSION:  Continue to progress strengthening, revise HEP/gym program PRN   Next MD visit: 05/16/24  Corean JULIANNA Ku, PT, DPT 01/25/2024, 3:30 PM

## 2024-02-08 ENCOUNTER — Encounter: Payer: Self-pay | Admitting: Physical Therapy

## 2024-02-08 ENCOUNTER — Ambulatory Visit (INDEPENDENT_AMBULATORY_CARE_PROVIDER_SITE_OTHER): Payer: Self-pay | Admitting: Physical Therapy

## 2024-02-08 DIAGNOSIS — M25562 Pain in left knee: Secondary | ICD-10-CM

## 2024-02-08 DIAGNOSIS — M25662 Stiffness of left knee, not elsewhere classified: Secondary | ICD-10-CM

## 2024-02-08 DIAGNOSIS — R2689 Other abnormalities of gait and mobility: Secondary | ICD-10-CM

## 2024-02-08 DIAGNOSIS — R600 Localized edema: Secondary | ICD-10-CM | POA: Diagnosis not present

## 2024-02-08 DIAGNOSIS — M6281 Muscle weakness (generalized): Secondary | ICD-10-CM | POA: Diagnosis not present

## 2024-02-08 DIAGNOSIS — G8929 Other chronic pain: Secondary | ICD-10-CM | POA: Diagnosis not present

## 2024-02-08 NOTE — Therapy (Signed)
 OUTPATIENT PHYSICAL THERAPY LOWER EXTREMITY TREATMENT   Patient Name: Jasmine Ortega MRN: [id number] DOB:[birth year], 43 y.o., female Today's Date: 02/08/2024  END OF SESSION:  PT End of Session - 02/08/24 0843     Visit Number 21    Number of Visits 26    Date for PT Re-Evaluation 04/18/24    Authorization Type [employer]    PT Start Time 0845    PT Stop Time 0927    PT Time Calculation (min) 42 min    Activity Tolerance Patient tolerated treatment well;No increased pain    Behavior During Therapy WFL for tasks assessed/performed                   Past Medical History:  Diagnosis Date   Birth control    Chicken pox    Depression    GERD (gastroesophageal reflux disease)    Gestational diabetes    Past Surgical History:  Procedure Laterality Date   ALLOGRAFT APPLICATION Left 09/13/2023   Procedure: BONE -PATELLA-TENDON-BONE ALLOGRAFT;  Surgeon: Addie Cordella Hamilton, MD;  Location: [facility] OR;  Service: Orthopedics;  Laterality: Left;   ANTERIOR CRUCIATE LIGAMENT REPAIR Left 09/13/2023   Procedure: LEFT KNEE ACL TEAR RECONSTRUCTION;  Surgeon: Addie Cordella Hamilton, MD;  Location: [facility] OR;  Service: Orthopedics;  Laterality: Left;   BONE MARROW ASPIRATION FOR SPINE FUSION ONLY (BMAC) Left 09/13/2023   Procedure: BONE MARROW ASPIRATE CONCENTRATE FROM ILLIAC CREST;  Surgeon: Addie Cordella Hamilton, MD;  Location: [facility] OR;  Service: Orthopedics;  Laterality: Left;   KNEE ARTHROSCOPY WITH MENISCAL REPAIR Left 09/13/2023   Procedure: MEDIAL MENISCAL TEAR REPAIR;  Surgeon: Addie Cordella Hamilton, MD;  Location: [facility] OR;  Service: Orthopedics;  Laterality: Left;   LASIK Bilateral    NO PAST SURGERIES     Patient Active Problem List   Diagnosis Date Noted   Left anterior cruciate ligament tear 09/18/2023   Acute medial meniscus tear of left knee 09/18/2023   Pain in left knee 08/03/2023   Chronic low back pain without sciatica 09/23/2021   Dysthymia 09/23/2021   Obesity (BMI 30-39.9)  09/14/2019   Hyperlipidemia 09/14/2019   Gastroesophageal reflux disease 04/27/2017   Attention deficit hyperactivity disorder (ADHD), predominantly inattentive type 03/05/2015   Loss of transverse plantar arch of right foot 09/18/2014    PCP: Joyce Norleen BROCKS, MD  REFERRING PROVIDER: Addie Cordella Hamilton, MD  REFERRING DIAG:  [phone] (ICD-10-CM) - Rupture of anterior cruciate ligament of left knee, initial encounter S83.242D (ICD-10-CM) - Other tear of medial meniscus, current injury, left knee, subsequent encounter  THERAPY DIAG:  Muscle weakness (generalized)  Chronic pain of left knee  Stiffness of left knee, not elsewhere classified  Other abnormalities of gait and mobility  Localized edema  Rationale for Evaluation and Treatment: Rehabilitation  ONSET DATE: 09/13/2023 s/p Lt ACL with medial meniscus  SUBJECTIVE:   SUBJECTIVE STATEMENT: Knee is doing pretty well; feels a little swollen today  PERTINENT HISTORY: 3/18 surgery with Left LEFT KNEE ACL TEAR RECONSTRUCTION , BONE MARROW ASPIRATE CONCENTRATE, Left MEDIAL MENISCAL TEAR REPAIR, Left BONE -PATELLA-TENDON-BONE ALLOGRAFT Chronic LBP, HLD, GERD, ADHD  No hx of DVT- ok to do BFR  PAIN:  NPRS scale:   0/10 today Pain location: Lt knee Pain description: burning, numbness Aggravating factors: too much WB, prolonged postures, walking end of day with muscle fatigue Relieving factors: ice, rest, elevation, pain meds- ibprophin, roboxin at bed time  PRECAUTIONS: Other:  ROM & strengthening closed chain; ok  to WBAT in 7 days  WEIGHT BEARING RESTRICTIONS: ok to WBAT in 7 days per referral 09/28/2023. However will need clarification.   FALLS:  Has patient fallen in last 6 months? 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 No displaced fragment. Lateral meniscus:  Intact.   LIGAMENTS Cruciates:  The ACL is chronically torn.  The PCL is intact. Collaterals:  Intact.  CARTILAGE Patellofemoral:  Preserved. Medial:  Minimally degenerated. Lateral:  Minimally degenerated. Joint:  Small to moderate joint effusion. Popliteal Fossa:  No Baker's cyst. Extensor Mechanism:  Intact.   Bones: No fracture, stress change or worrisome lesion. Small enchondroma distal femur noted   Other: Subcutaneous edema anterior to the patella and patellar tendon compatible bursitis is seen.   IMPRESSION: 1. Longitudinal tear posterior horn and posterior body of the medial meniscus without displaced fragment. 2. Chronic ACL tear. 3. Mild medial and lateral compartment osteoarthritis. 4. Prepatellar bursitis.    PATIENT SURVEYS:  Patient-Specific Activity Scoring Scheme  0 represents "unable to perform." 10 represents "able to perform at prior level. 0 1 2 3 4 5 6 7 8 9  10 (Date and Score)   Activity Eval 09/22/23 11/09/23  11/23/23 01/25/24  1. Full AROM of knee 1   6 8 8   2. Improved knee stability  1  7 8 7   3. Return to jujitsu 1 0 0 1  4. stairs 1 1 4 6   5. walking 1 9 9 9   Score 1/10 4.6 5.8 6.2   Total score = sum of the activity scores/number of activities Minimum detectable change (90%CI) for average score = 2 points Minimum detectable change (90%CI) for single activity score = 3 points  COGNITION: Evaluation on 09/22/2023:   Overall cognitive status: WFL    SENSATION: Evaluation on 09/22/2023:   WFL  EDEMA:  Evaluation on 09/22/2023:  Not measured but left knee is larger by  observation.  She reports increased with activities.   POSTURE:  Evaluation on 09/22/2023:  No Significant postural limitations  PALPATION: Evaluation on 09/22/2023:  Tender to surgical sites and quadricep  LOWER EXTREMITY ROM:   ROM Left Eval 09/22/2023 Left  09/30/2023 Left 10/26/23  Knee flexion Supine with strap AA: 84 Active at 101 A: 120 (After heelslides 122)  Knee extension Supine quad set A: -7 Active -1    (Blank rows = not tested)  LOWER EXTREMITY MMT:  Lt not tested at eval due to surgery  MMT Right Eval 09/22/23 Left Eval 09/22/23 Left 11/09/23 HHD Left 11/23/23 HHD Right/Left 01/25/24  Hip flexion 5 4     Hip extension       Hip abduction       Hip adduction       Hip internal rotation       Hip external rotation       Knee flexion 5 3-/5 21.2 ppsi 43.6#   Knee extension 5 3-/5 42.1 ppsi 56.1# 94.15#/82.73 (88% of RLE)  Ankle dorsiflexion 5  5     Ankle plantarflexion 5 as seen in gait      Ankle inversion       Ankle eversion        (Blank rows = not tested)  FUNCTIONAL TESTS:  Evaluation on 09/22/2023: Rises from 18in chair with BUE use on armrests and LLE slightly in front to decrease knee bending  11/23/23 Y Balance Test:  Leg Length 32.75 bil RLE:  Anterior: 22.17  Posterior Medial: 34  Posterior Lateral: 29.67 LLE:   Anterior: 20.83  Posterior Medial: 31.33  Posterior Lateral: 31.67 Composite Score:  Right: 87%  Left: 85%  01/11/24 Y Balance Test:  Leg Length 32.75 bil RLE:  Anterior: 23.67  Posterior Medial: 36.33  Posterior Lateral: 32.33 LLE:   Anterior: 22.5  Posterior Medial: 36  Posterior Lateral: 30 Composite Score:  Right: 94%  Left: 90%  GAIT: Evaluation on 09/22/2023: Distance walked: clinical Assistive device utilized: Crutches Level of assistance: SBA Comments: crutches too short, PWB on LLE using 3pt gait, decreased cadence, decreased step length                                                                                                                                                                         TODAY'S TREATMENT 02/08/24 TherEx Recumbent bike L10 x 8 min Knee extension 20# bil concentric; LLE only eccentric 3x10  TherAct Leg Press bil 150# 3x10; LLE 75# 3x10 Squat jacks 3x10 Alternating tall kneeling to 1/2 kneeling 2x10 bil   01/25/24 TherEx Recumbent bike L10 x 8 min MMT with hand held dynamometry - see above for details Discussed/updated current HEP with trial of squat/calf raises and squat jumps, as well as curtsy lunge with hop to other side   01/11/24 TherEx Recumbent bike L6 x 8 min  Physical Performance Test Y Balance Test:  Leg Length 32.75 bil RLE:  Anterior: 23.67  Posterior Medial: 36.33  Posterior Lateral: 32.33 LLE:   Anterior: 22.5  Posterior Medial: 36  Posterior Lateral: 30 Composite Score:  Right: 94%  Left: 90%  TherAct TRX curtsy lunges 2x10 bil Deadlifts 35# on each cable 3x10 (standing on 8 block) Bil small jumps forward/backward working towards constant motion - able to perform 3-5 jumps continuously at this time with mild pain  01/04/2024  Therapeutic Exercise: Recumbent bike seat 3 level 5 for 8 min Quad stretch 20 sec hold 2 reps standing knee flexion Leg ext machine 5# BLE concentric & LLE isometric / eccentric 10 reps 2 sets Quad stretch standing with towel 20 sec hold 2 reps Gastroc stretch step heel depression 20 sec hold 2 reps Hamstring stretch foot on 2nd step 20 sec hold 2 reps Modified mountain climber with counter support 10  reps.  PT demo & verbal on technique PT educated patient on need for flexibility, strength, balance and muscle endurance built into her HEP.  If she has to choose due to time constraints on strength training versus muscle endurance at the gym PT recommends strength first.  Patient verbalized understanding.  Patient appears to understand stretches for flexibility noted above and  rationale to prevent changes in functional activities being limited by range.  Therapeutic Activities: Stairs alternating pattern with single rail with improved weight shift knee control.  Lunge between 2 chairs B UE support PT cues on increasing step length and powering out of lunge with BLEs.  Patient able to return demonstration and verbalized understanding for HEP to increase power of motion. Large zigzag steps working on medial lateral knee stability.  Patient performed 5 reps with each direction 3 laps with improved knee control.  Patient does not appear ready for any Thera-Band resistance for this activity at this time.   12/28/2023  Therapeutic Exercise: Recumbent bike seat 3 level 4 for 4 min & level 7 for 4 min as warm-up (taking subjective).   Leg ext machine 5# BLE concentric & LLE isometric / eccentric 10 reps  Therapeutic Activities: Stairs descending right rail alternating pattern with pain at peak knee flexion.  Ascending left rail alternating pattern with pain (less intense) on last couple of steps.   LLE knee TKE + SLS with blue theraband 10 reps Squat over chair LLE blue theraband resistance 10 reps Lunge bw 2 chairs alternating forward LE 10 reps ea.  Pt realized that she had tried with only one chair which PT advised may cause rotation.   Stepping down from 4 step - using mirror alternating LEs so LLE form close to RLE form.  Pt initially limited forward weight shift motion and Trendelenburg motion with LLE performing the motion.  She did improve with repetition and PT cues.  Progress to blue Thera-Band resistance anteriorly for 10 reps.  PT educated patient how to set up and try at home. Patient verbalized understanding of above activities for home after completing during PT session.  PATIENT EDUCATION:  Education details: HEP, POC Person educated: Patient Education method: Programmer, multimedia, Demonstration, Verbal cues, and Handouts Education comprehension: verbalized  understanding, returned demonstration, and verbal cues required  HOME EXERCISE PROGRAM: Access Code: [email or web address] URL: [email or web address] Date: 09/30/2023 Prepared by: Lamar Ivory  Exercises - Ankle Alphabet in Elevation  - 2-3 x daily - 7 x weekly - 1 sets - 2-3 reps - elevate 10-15 min hold - Supine Active Straight Leg Raise (Mirrored)  - 2-3 x daily - 7 x weekly - 2 sets - 10 reps - 5 seconds hold - supine quad set with towel roll under ankle  - 2-3 x daily - 7 x weekly - 2 sets - 10 reps - 5 seconds hold - Supine Heel Slide with Strap  - 2-3 x daily - 7 x weekly - 2 sets - 10 reps - 5 seconds hold - seated single straight leg raise   - 2-3 x daily - 7 x weekly - 2 sets - 10 reps - 5 seconds hold - Seated Quad Set  - 2-3 x daily - 7 x weekly - 2 sets - 10 reps - 5 seconds hold - Seated Knee Flexion Extension AROM   - 2-3 x daily - 7 x weekly - 2 sets - 10 reps - 5 seconds hold - Prone Hip Extension  - 1 x daily - 7 x  weekly - 2 sets - 10 reps - 3 seconds hold  ASSESSMENT:  CLINICAL IMPRESSION Continued strength progressions while also incorporating jujitsu simulated activities at this time with fair tolerance.  Continue skilled PT at this time to maximize functional strength and mobility.  OBJECTIVE IMPAIRMENTS: Abnormal gait, decreased activity tolerance, decreased balance, decreased coordination, decreased endurance, decreased knowledge of condition, decreased knowledge of use of DME, decreased mobility, difficulty walking, decreased ROM, decreased strength, increased edema, increased muscle spasms, and pain.   ACTIVITY LIMITATIONS: carrying, lifting, bending, sitting, standing, squatting, stairs, and locomotion level  PARTICIPATION LIMITATIONS: meal prep, cleaning, laundry, driving, shopping, community activity, and occupation  PERSONAL FACTORS: Profession, Time since onset of injury/illness/exacerbation, and 1-2 comorbidities: see above are also affecting patient's  functional outcome.   REHAB POTENTIAL: Good  CLINICAL DECISION MAKING: Stable/uncomplicated  EVALUATION COMPLEXITY: Low   GOALS: Goals reviewed with patient? Yes  SHORT TERM GOALS: (target date for Short term goals are 3 weeks 10/13/2023)   1.  Patient will demonstrate independent use of home exercise program to maintain progress from in clinic treatments. Goal status: MET 10/26/23  2.  Patient will demonstrate AROM 0-95deg  Goal status: MET 10/26/23   LONG TERM GOALS: (target date: 04/18/2024)   1. Patient will demonstrate/report pain at worst less than or equal to 2/10 to facilitate minimal limitation in daily activity secondary to pain symptoms.  Goal status: Met 11/09/2023   2. Patient will demonstrate independent use of home exercise program to facilitate ability to maintain/progress functional gains from skilled physical therapy services. Goal status: ONGOING (continue goal ) 01/25/24   3. Patient will demonstrate Patient specific functional scale avg > or = 7 to indicate reduced disability due to condition.  Goal status: ONGOING (continue goal ) 01/25/24  4.  Patient will demonstrate LLE MMT 5/5 throughout to faciltiate usual transfers, stairs, squatting at [facility] for daily life.  Goal status: MET 01/25/24   5.  Patient will demonstrate/report ability to perform 18 inch chair transfer without UE assist.  Goal status: MET 11/09/2023   6.  Patient will demonstrate up and down a flight of stairs with single hand rail with reciprocal gait pattern.  Goal status: ONGOING (continue goal ) 01/25/24   7.  patient will demonstrate AROM 0-120 for full range of motion necessary to perform job tasks Goal Status: MET 11/09/23  8.  Improve Y balance test to > 90% composite score bil in order to work towards return to [employer]. Goal Status: Partially met (met on Rt) 01/11/24 (7/30 - continue goal)  9. LLE quad and hamstring strength improved to at least 93% of RLE for improved strength and  participate in sport specific activities Goal Status: INITIAL  PLAN:  PT FREQUENCY:  Every other week  PT DURATION: 12 weeks  PLANNED INTERVENTIONS: Can include 02853- PT Re-evaluation, 97110-Therapeutic exercises, 97530- Therapeutic activity, 97112- Neuromuscular re-education, 97535- Self Care, 97140- Manual therapy, [phone]- Gait training, [phone]- Orthotic Fit/training, [phone]- Canalith repositioning, [id number]- Aquatic Therapy, [phone]- Electrical stimulation (unattended), [phone]- Electrical stimulation (manual), [id number] Physical performance testing, 97016- Vasopneumatic device, [id number]- Ultrasound, [id number]- Traction (mechanical), [id number]- Ionotophoresis 4mg /ml Dexamethasone , Blood Flow Restriction Therapy,  Patient/Family education, Balance training, Stair training, Taping, Dry Needling, Joint mobilization, Joint manipulation, Spinal manipulation, Spinal mobilization, Scar mobilization, Vestibular training, Visual/preceptual remediation/compensation, DME instructions, Cryotherapy, and Moist heat.  All performed as medically necessary.  All included unless contraindicated  PLAN FOR NEXT SESSION:  Jujitsu simulated activities as tolerated Continue to progress strengthening, revise HEP/gym program  PRN   Next MD visit: 05/16/24   Corean JULIANNA Ku, PT, DPT 02/08/24 11:15 AM

## 2024-02-16 ENCOUNTER — Other Ambulatory Visit (HOSPITAL_COMMUNITY): Payer: Self-pay

## 2024-02-16 MED ORDER — TINIDAZOLE 500 MG PO TABS
500.0000 mg | ORAL_TABLET | Freq: Every day | ORAL | 1 refills | Status: DC
  Filled 2024-02-16: qty 12, 3d supply, fill #0
  Filled 2024-02-17 – 2024-02-18 (×2): qty 12, 3d supply, fill #1

## 2024-02-17 ENCOUNTER — Other Ambulatory Visit (HOSPITAL_COMMUNITY): Payer: Self-pay

## 2024-02-22 ENCOUNTER — Encounter: Payer: Self-pay | Admitting: Physical Therapy

## 2024-02-22 ENCOUNTER — Ambulatory Visit (INDEPENDENT_AMBULATORY_CARE_PROVIDER_SITE_OTHER): Admitting: Physical Therapy

## 2024-02-22 DIAGNOSIS — M25562 Pain in left knee: Secondary | ICD-10-CM | POA: Diagnosis not present

## 2024-02-22 DIAGNOSIS — R2689 Other abnormalities of gait and mobility: Secondary | ICD-10-CM

## 2024-02-22 DIAGNOSIS — M25662 Stiffness of left knee, not elsewhere classified: Secondary | ICD-10-CM

## 2024-02-22 DIAGNOSIS — G8929 Other chronic pain: Secondary | ICD-10-CM

## 2024-02-22 DIAGNOSIS — R600 Localized edema: Secondary | ICD-10-CM | POA: Diagnosis not present

## 2024-02-22 DIAGNOSIS — M6281 Muscle weakness (generalized): Secondary | ICD-10-CM

## 2024-02-22 NOTE — Therapy (Addendum)
 OUTPATIENT PHYSICAL THERAPY LOWER EXTREMITY TREATMENT   Patient Name: Jasmine Ortega MRN: [id number] DOB:03-Feb-1981, 43 y.o., female Today's Date: 02/22/2024  END OF SESSION:  PT End of Session - 02/22/24 1432     Visit Number 22    Number of Visits 26    Date for PT Re-Evaluation 04/18/24    Authorization Type [employer]    PT Start Time 1433    PT Stop Time 1515    PT Time Calculation (min) 42 min    Activity Tolerance Patient tolerated treatment well;No increased pain    Behavior During Therapy WFL for tasks assessed/performed           Past Medical History:  Diagnosis Date   Birth control    Chicken pox    Depression    GERD (gastroesophageal reflux disease)    Gestational diabetes    Past Surgical History:  Procedure Laterality Date   ALLOGRAFT APPLICATION Left 09/13/2023   Procedure: BONE -PATELLA-TENDON-BONE ALLOGRAFT;  Surgeon: Addie Cordella Hamilton, MD;  Location: [facility] OR;  Service: Orthopedics;  Laterality: Left;   ANTERIOR CRUCIATE LIGAMENT REPAIR Left 09/13/2023   Procedure: LEFT KNEE ACL TEAR RECONSTRUCTION;  Surgeon: Addie Cordella Hamilton, MD;  Location: [facility] OR;  Service: Orthopedics;  Laterality: Left;   BONE MARROW ASPIRATION FOR SPINE FUSION ONLY (BMAC) Left 09/13/2023   Procedure: BONE MARROW ASPIRATE CONCENTRATE FROM ILLIAC CREST;  Surgeon: Addie Cordella Hamilton, MD;  Location: [facility] OR;  Service: Orthopedics;  Laterality: Left;   KNEE ARTHROSCOPY WITH MENISCAL REPAIR Left 09/13/2023   Procedure: MEDIAL MENISCAL TEAR REPAIR;  Surgeon: Addie Cordella Hamilton, MD;  Location: [facility] OR;  Service: Orthopedics;  Laterality: Left;   LASIK Bilateral    NO PAST SURGERIES     Patient Active Problem List   Diagnosis Date Noted   Left anterior cruciate ligament tear 09/18/2023   Acute medial meniscus tear of left knee 09/18/2023   Pain in left knee 08/03/2023   Chronic low back pain without sciatica 09/23/2021   Dysthymia 09/23/2021   Obesity (BMI 30-39.9) 09/14/2019    Hyperlipidemia 09/14/2019   Gastroesophageal reflux disease 04/27/2017   Attention deficit hyperactivity disorder (ADHD), predominantly inattentive type 03/05/2015   Loss of transverse plantar arch of right foot 09/18/2014    PCP: Joyce Norleen BROCKS, MD  REFERRING PROVIDER: Addie Cordella Hamilton, MD  REFERRING DIAG:  [phone] (ICD-10-CM) - Rupture of anterior cruciate ligament of left knee, initial encounter S83.242D (ICD-10-CM) - Other tear of medial meniscus, current injury, left knee, subsequent encounter  THERAPY DIAG:  Muscle weakness (generalized)  Chronic pain of left knee  Stiffness of left knee, not elsewhere classified  Other abnormalities of gait and mobility  Localized edema  Rationale for Evaluation and Treatment: Rehabilitation  ONSET DATE: 09/13/2023 s/p Lt ACL with medial meniscus  SUBJECTIVE:   SUBJECTIVE STATEMENT: Patient has felt some soreness and weakness in quads.  Patient reports no pain.   PERTINENT HISTORY: 3/18 surgery with Left LEFT KNEE ACL TEAR RECONSTRUCTION , BONE MARROW ASPIRATE CONCENTRATE, Left MEDIAL MENISCAL TEAR REPAIR, Left BONE -PATELLA-TENDON-BONE ALLOGRAFT Chronic LBP, HLD, GERD, ADHD  No hx of DVT- ok to do BFR  PAIN:  NPRS scale:   0/10 today Pain location: Lt knee Pain description: burning, numbness Aggravating factors: too much WB, prolonged postures, walking end of day with muscle fatigue Relieving factors: ice, rest, elevation, pain meds- ibprophin, roboxin at bed time  PRECAUTIONS: Other:  ROM & strengthening closed chain; ok to WBAT in  7 days  WEIGHT BEARING RESTRICTIONS: ok to WBAT in 7 days per referral 09/28/2023. However will need clarification.   FALLS:  Has patient fallen in last 6 months? 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 No displaced fragment. Lateral meniscus:  Intact.   LIGAMENTS Cruciates:  The ACL is chronically torn.  The PCL is intact. Collaterals:  Intact.  CARTILAGE Patellofemoral:  Preserved. Medial:  Minimally degenerated. Lateral:  Minimally degenerated. Joint:  Small to moderate joint effusion. Popliteal Fossa:  No Baker's cyst. Extensor Mechanism:  Intact.   Bones: No fracture, stress change or worrisome lesion. Small enchondroma distal femur noted   Other: Subcutaneous edema anterior to the patella and patellar tendon compatible bursitis is seen.   IMPRESSION: 1. Longitudinal tear posterior horn and posterior body of the medial meniscus without displaced fragment. 2. Chronic ACL tear. 3. Mild medial and lateral compartment osteoarthritis. 4. Prepatellar bursitis.    PATIENT SURVEYS:  Patient-Specific Activity Scoring Scheme  0 represents "unable to perform." 10 represents "able to perform at prior level. 0 1 2 3 4 5 6 7 8 9  10 (Date and Score)   Activity Eval 09/22/23 11/09/23  11/23/23 01/25/24  1. Full AROM of knee 1   6 8 8   2. Improved knee stability  1  7 8 7   3. Return to jujitsu 1 0 0 1  4. stairs 1 1 4 6   5. walking 1 9 9 9   Score 1/10 4.6 5.8 6.2   Total score = sum of the activity scores/number of activities Minimum detectable change (90%CI) for average score = 2 points Minimum detectable change (90%CI) for single activity score = 3 points  COGNITION: Evaluation on 09/22/2023:   Overall cognitive status: WFL    SENSATION: Evaluation on 09/22/2023:   WFL  EDEMA:  Evaluation on 09/22/2023:  Not measured but left  knee is larger by observation.  She reports increased with activities.   POSTURE:  Evaluation on 09/22/2023:  No Significant postural limitations  PALPATION: Evaluation on 09/22/2023:  Tender to surgical sites and quadricep  LOWER EXTREMITY ROM:   ROM Left Eval 09/22/2023 Left  09/30/2023 Left 10/26/23  Knee flexion Supine with strap AA: 84 Active at 101 A: 120 (After heelslides 122)  Knee extension Supine quad set A: -7 Active -1    (Blank rows = not tested)  LOWER EXTREMITY MMT:  Lt not tested at eval due to surgery  MMT Right Eval 09/22/23 Left Eval 09/22/23 Left 11/09/23 HHD Left 11/23/23 HHD Right/Left 01/25/24  Hip flexion 5 4     Hip extension       Hip abduction       Hip adduction       Hip internal rotation       Hip external rotation       Knee flexion 5 3-/5 21.2 ppsi 43.6#   Knee extension 5 3-/5 42.1 ppsi 56.1# 94.15#/82.73 (88% of RLE)  Ankle dorsiflexion 5 5  Ankle plantarflexion 5 as seen in gait      Ankle inversion       Ankle eversion        (Blank rows = not tested)  FUNCTIONAL TESTS:  Evaluation on 09/22/2023: Rises from 18in chair with BUE use on armrests and LLE slightly in front to decrease knee bending  11/23/23 Y Balance Test:  Leg Length 32.75 bil RLE:  Anterior: 22.17  Posterior Medial: 34  Posterior Lateral: 29.67 LLE:   Anterior: 20.83  Posterior Medial: 31.33  Posterior Lateral: 31.67 Composite Score:  Right: 87%  Left: 85%  01/11/24 Y Balance Test:  Leg Length 32.75 bil RLE:  Anterior: 23.67  Posterior Medial: 36.33  Posterior Lateral: 32.33 LLE:   Anterior: 22.5  Posterior Medial: 36  Posterior Lateral: 30 Composite Score:  Right: 94%  Left: 90%  GAIT: Evaluation on 09/22/2023: Distance walked: clinical Assistive device utilized: Crutches Level of assistance: SBA Comments: crutches too short, PWB on LLE using 3pt gait, decreased cadence, decreased step length                                                                                                                                                                         TODAY'S TREATMENT 02/22/24 TherEx Recumbent bike, seat 4, level 10 for 8 minutes working on functional range and strength  TherAct Heel taps from 6 box w/ focus on eccentric control in lowering for facilitation of stairs, 3x8 Ladder exercises for quick steps during sport activities: Forward, lateral, lateral/backward, lateral/diagonal.  Patient was hesitant about speeding up after a few tries TRX: reverse lunge, 3x5.  Done to facilitate activities during sport  Neuro Star reaches standing on floor with focus on eccentric control and balance, 5 rounds ea LE SLS ball toss for balance   TODAY'S TREATMENT 02/08/24 TherEx Recumbent bike L10 x 8 min Knee extension 20# bil concentric; LLE only eccentric 3x10  TherAct Leg Press bil 150# 3x10; LLE 75# 3x10 Squat jacks 3x10 Alternating tall kneeling to 1/2 kneeling 2x10 bil   01/25/24 TherEx Recumbent bike L10 x 8 min MMT with hand held dynamometry - see above for details Discussed/updated current HEP with trial of squat/calf raises and squat jumps, as well as curtsy lunge with hop to other side   01/11/24 TherEx Recumbent bike L6 x 8 min  Physical Performance Test Y Balance Test:  Leg Length 32.75 bil RLE:  Anterior: 23.67  Posterior Medial: 36.33  Posterior Lateral: 32.33 LLE:   Anterior: 22.5  Posterior Medial: 36  Posterior Lateral: 30 Composite Score:  Right: 94%  Left: 90%  TherAct TRX curtsy lunges 2x10 bil Deadlifts 35# on each cable 3x10 (standing on 8 block) Bil small jumps forward/backward working towards  constant motion - able to perform 3-5 jumps continuously at this time with mild pain   PATIENT EDUCATION:  Education details: HEP, POC Person educated: Patient Education method: Explanation, Demonstration, Verbal cues, and Handouts Education comprehension:  verbalized understanding, returned demonstration, and verbal cues required  HOME EXERCISE PROGRAM: Access Code: [email or web address] URL: [email or web address] Date: 09/30/2023 Prepared by: Lamar Ivory  Exercises - Ankle Alphabet in Elevation  - 2-3 x daily - 7 x weekly - 1 sets - 2-3 reps - elevate 10-15 min hold - Supine Active Straight Leg Raise (Mirrored)  - 2-3 x daily - 7 x weekly - 2 sets - 10 reps - 5 seconds hold - supine quad set with towel roll under ankle  - 2-3 x daily - 7 x weekly - 2 sets - 10 reps - 5 seconds hold - Supine Heel Slide with Strap  - 2-3 x daily - 7 x weekly - 2 sets - 10 reps - 5 seconds hold - seated single straight leg raise   - 2-3 x daily - 7 x weekly - 2 sets - 10 reps - 5 seconds hold - Seated Quad Set  - 2-3 x daily - 7 x weekly - 2 sets - 10 reps - 5 seconds hold - Seated Knee Flexion Extension AROM   - 2-3 x daily - 7 x weekly - 2 sets - 10 reps - 5 seconds hold - Prone Hip Extension  - 1 x daily - 7 x weekly - 2 sets - 10 reps - 3 seconds hold  ASSESSMENT:  CLINICAL IMPRESSION Patient tolerated treatment well today.  Exercises today included strengthening and power activities to help return to jujitsu.  Patient has good eccentric control but fatigues quickly during those exercises.  Patient is still timid to do quick and light movements. Patient has difficulty with single-leg balance and is unable to stay steady with UE movement. Patient will continue to benefit from skilled PT to address impairments.   OBJECTIVE IMPAIRMENTS: Abnormal gait, decreased activity tolerance, decreased balance, decreased coordination, decreased endurance, decreased knowledge of condition, decreased knowledge of use of DME, decreased mobility, difficulty walking, decreased ROM, decreased strength, increased edema, increased muscle spasms, and pain.   ACTIVITY LIMITATIONS: carrying, lifting, bending, sitting, standing, squatting, stairs, and locomotion  level  PARTICIPATION LIMITATIONS: meal prep, cleaning, laundry, driving, shopping, community activity, and occupation  PERSONAL FACTORS: Profession, Time since onset of injury/illness/exacerbation, and 1-2 comorbidities: see above are also affecting patient's functional outcome.   REHAB POTENTIAL: Good  CLINICAL DECISION MAKING: Stable/uncomplicated  EVALUATION COMPLEXITY: Low   GOALS: Goals reviewed with patient? Yes  SHORT TERM GOALS: (target date for Short term goals are 3 weeks 10/13/2023)   1.  Patient will demonstrate independent use of home exercise program to maintain progress from in clinic treatments. Goal status: MET 10/26/23  2.  Patient will demonstrate AROM 0-95deg  Goal status: MET 10/26/23   LONG TERM GOALS: (target date: 04/18/2024)   1. Patient will demonstrate/report pain at worst less than or equal to 2/10 to facilitate minimal limitation in daily activity secondary to pain symptoms.  Goal status: Met 11/09/2023   2. Patient will demonstrate independent use of home exercise program to facilitate ability to maintain/progress functional gains from skilled physical therapy services. Goal status: ONGOING (continue goal ) 01/25/24   3. Patient will demonstrate Patient specific functional scale avg > or = 7 to indicate reduced disability due to condition.  Goal status: ONGOING (continue goal ) 01/25/24  4.  Patient will demonstrate LLE MMT 5/5 throughout to faciltiate usual transfers, stairs, squatting at [facility] for daily life.  Goal status: MET 01/25/24   5.  Patient will demonstrate/report ability to perform 18 inch chair transfer without UE assist.  Goal status: MET 11/09/2023   6.  Patient will demonstrate up and down a flight of stairs with single hand rail with reciprocal gait pattern.  Goal status: ONGOING (continue goal ) 01/25/24   7.  patient will demonstrate AROM 0-120 for full range of motion necessary to perform job tasks Goal Status: MET 11/09/23  8.   Improve Y balance test to > 90% composite score bil in order to work towards return to [employer]. Goal Status: Partially met (met on Rt) 01/11/24 (7/30 - continue goal)  9. LLE quad and hamstring strength improved to at least 93% of RLE for improved strength and participate in sport specific activities Goal Status: ongoing, 02/22/2024  PLAN:  PT FREQUENCY:  Every other week  PT DURATION: 12 weeks  PLANNED INTERVENTIONS: Can include 02853- PT Re-evaluation, 97110-Therapeutic exercises, 97530- Therapeutic activity, 97112- Neuromuscular re-education, 97535- Self Care, 97140- Manual therapy, [phone]- Gait training, [phone]- Orthotic Fit/training, [phone]- Canalith repositioning, [id number]- Aquatic Therapy, [phone]- Electrical stimulation (unattended), [phone]- Electrical stimulation (manual), [id number] Physical performance testing, 97016- Vasopneumatic device, [id number]- Ultrasound, [id number]- Traction (mechanical), [id number]- Ionotophoresis 4mg /ml Dexamethasone , Blood Flow Restriction Therapy,  Patient/Family education, Balance training, Stair training, Taping, Dry Needling, Joint mobilization, Joint manipulation, Spinal manipulation, Spinal mobilization, Scar mobilization, Vestibular training, Visual/preceptual remediation/compensation, DME instructions, Cryotherapy, and Moist heat.  All performed as medically necessary.  All included unless contraindicated  PLAN FOR NEXT SESSION: Jujitsu simulated activities as tolerated, quick feet activities, SLS balance, continue strengthening   Next MD visit: 05/16/24   Ismael Theophilus Stallion, SPT 02/22/24 5:12 PM  This entire session of physical therapy was performed under the direct supervision of PT signing evaluation /treatment. PT reviewed note and agrees.   Grayce Spatz, PT, DPT 02/22/2024, 9:32 PM

## 2024-02-23 ENCOUNTER — Other Ambulatory Visit (HOSPITAL_COMMUNITY): Payer: Self-pay

## 2024-03-07 ENCOUNTER — Encounter: Payer: Self-pay | Admitting: Physical Therapy

## 2024-03-07 ENCOUNTER — Ambulatory Visit: Admitting: Physical Therapy

## 2024-03-07 DIAGNOSIS — R600 Localized edema: Secondary | ICD-10-CM | POA: Diagnosis not present

## 2024-03-07 DIAGNOSIS — M6281 Muscle weakness (generalized): Secondary | ICD-10-CM | POA: Diagnosis not present

## 2024-03-07 DIAGNOSIS — G8929 Other chronic pain: Secondary | ICD-10-CM | POA: Diagnosis not present

## 2024-03-07 DIAGNOSIS — R293 Abnormal posture: Secondary | ICD-10-CM | POA: Diagnosis not present

## 2024-03-07 DIAGNOSIS — M25562 Pain in left knee: Secondary | ICD-10-CM | POA: Diagnosis not present

## 2024-03-07 DIAGNOSIS — R2689 Other abnormalities of gait and mobility: Secondary | ICD-10-CM | POA: Diagnosis not present

## 2024-03-07 DIAGNOSIS — M25662 Stiffness of left knee, not elsewhere classified: Secondary | ICD-10-CM | POA: Diagnosis not present

## 2024-03-07 NOTE — Therapy (Signed)
 OUTPATIENT PHYSICAL THERAPY LOWER EXTREMITY TREATMENT   Patient Name: Jasmine Ortega MRN: [id number] DOB:05-23-1981, 43 y.o., female Today's Date: 03/07/2024  END OF SESSION:  PT End of Session - 03/07/24 0803     Visit Number 23    Number of Visits 26    Date for PT Re-Evaluation 04/18/24    Authorization Type [employer]    PT Start Time 0800    PT Stop Time 0840    PT Time Calculation (min) 40 min    Activity Tolerance Patient tolerated treatment well;No increased pain    Behavior During Therapy WFL for tasks assessed/performed            Past Medical History:  Diagnosis Date   Birth control    Chicken pox    Depression    GERD (gastroesophageal reflux disease)    Gestational diabetes    Past Surgical History:  Procedure Laterality Date   ALLOGRAFT APPLICATION Left 09/13/2023   Procedure: BONE -PATELLA-TENDON-BONE ALLOGRAFT;  Surgeon: Addie Cordella Hamilton, MD;  Location: [facility] OR;  Service: Orthopedics;  Laterality: Left;   ANTERIOR CRUCIATE LIGAMENT REPAIR Left 09/13/2023   Procedure: LEFT KNEE ACL TEAR RECONSTRUCTION;  Surgeon: Addie Cordella Hamilton, MD;  Location: [facility] OR;  Service: Orthopedics;  Laterality: Left;   BONE MARROW ASPIRATION FOR SPINE FUSION ONLY (BMAC) Left 09/13/2023   Procedure: BONE MARROW ASPIRATE CONCENTRATE FROM ILLIAC CREST;  Surgeon: Addie Cordella Hamilton, MD;  Location: [facility] OR;  Service: Orthopedics;  Laterality: Left;   KNEE ARTHROSCOPY WITH MENISCAL REPAIR Left 09/13/2023   Procedure: MEDIAL MENISCAL TEAR REPAIR;  Surgeon: Addie Cordella Hamilton, MD;  Location: [facility] OR;  Service: Orthopedics;  Laterality: Left;   LASIK Bilateral    NO PAST SURGERIES     Patient Active Problem List   Diagnosis Date Noted   Left anterior cruciate ligament tear 09/18/2023   Acute medial meniscus tear of left knee 09/18/2023   Pain in left knee 08/03/2023   Chronic low back pain without sciatica 09/23/2021   Dysthymia 09/23/2021   Obesity (BMI 30-39.9) 09/14/2019    Hyperlipidemia 09/14/2019   Gastroesophageal reflux disease 04/27/2017   Attention deficit hyperactivity disorder (ADHD), predominantly inattentive type 03/05/2015   Loss of transverse plantar arch of right foot 09/18/2014    PCP: Joyce Norleen BROCKS, MD  REFERRING PROVIDER: Addie Cordella Hamilton, MD  REFERRING DIAG:  [phone] (ICD-10-CM) - Rupture of anterior cruciate ligament of left knee, initial encounter S83.242D (ICD-10-CM) - Other tear of medial meniscus, current injury, left knee, subsequent encounter  THERAPY DIAG:  Muscle weakness (generalized)  Chronic pain of left knee  Stiffness of left knee, not elsewhere classified  Other abnormalities of gait and mobility  Localized edema  Abnormal posture  Rationale for Evaluation and Treatment: Rehabilitation  ONSET DATE: 09/13/2023 s/p Lt ACL with medial meniscus  SUBJECTIVE:   SUBJECTIVE STATEMENT:  Knee is doing well   PERTINENT HISTORY: 3/18 surgery with Left LEFT KNEE ACL TEAR RECONSTRUCTION , BONE MARROW ASPIRATE CONCENTRATE, Left MEDIAL MENISCAL TEAR REPAIR, Left BONE -PATELLA-TENDON-BONE ALLOGRAFT Chronic LBP, HLD, GERD, ADHD  No hx of DVT- ok to do BFR  PAIN:  NPRS scale:   0/10 today Pain location: Lt knee Pain description: burning, numbness Aggravating factors: too much WB, prolonged postures, walking end of day with muscle fatigue Relieving factors: ice, rest, elevation, pain meds- ibprophin, roboxin at bed time  PRECAUTIONS: Other:  ROM & strengthening closed chain; ok to WBAT in 7 days  WEIGHT BEARING  RESTRICTIONS: ok to WBAT in 7 days per referral 09/28/2023. However will need clarification.   FALLS:  Has patient fallen in last 6 months? 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 Longitudinal tear posterior horn and posterior body of the medial meniscus without displaced fragment. 2. Chronic ACL tear. 3. Mild medial and lateral compartment osteoarthritis. 4. Prepatellar bursitis.    PATIENT SURVEYS:  Patient-Specific Activity Scoring Scheme  0 represents "unable to perform." 10 represents "able to perform at prior level. 0 1 2 3 4 5 6 7 8 9  10 (Date and Score)   Activity Eval 09/22/23 11/09/23  11/23/23 01/25/24  1. Full AROM of knee 1   6 8 8   2. Improved knee stability  1  7 8 7   3. Return to jujitsu 1 0 0 1  4. stairs 1 1 4 6   5. walking 1 9 9 9   Score 1/10 4.6 5.8 6.2   Total score = sum of the activity scores/number of activities Minimum detectable change (90%CI) for average score = 2 points Minimum detectable change (90%CI) for single activity score = 3 points  COGNITION: Evaluation on 09/22/2023:   Overall cognitive status: WFL    SENSATION: Evaluation on 09/22/2023:   WFL  EDEMA:  Evaluation on 09/22/2023:  Not measured but left knee is larger by observation.  She reports increased with activities.   POSTURE:  Evaluation on 09/22/2023:  No Significant postural limitations  PALPATION: Evaluation on 09/22/2023:  Tender to surgical sites and quadricep  LOWER EXTREMITY ROM:   ROM Left Eval 09/22/2023 Left  09/30/2023 Left 10/26/23  Knee flexion Supine with strap AA: 84 Active at 101 A: 120 (After heelslides 122)  Knee extension Supine quad set A: -7 Active -1    (Blank rows = not tested)  LOWER EXTREMITY MMT:  Lt not tested at eval due to surgery  MMT Right Eval 09/22/23 Left Eval 09/22/23 Left 11/09/23 HHD Left 11/23/23 HHD Right/Left 01/25/24  Hip flexion 5 4     Hip extension       Hip abduction       Hip adduction       Hip internal  rotation       Hip external rotation       Knee flexion 5 3-/5 21.2 ppsi 43.6#   Knee extension 5 3-/5 42.1 ppsi 56.1# 94.15#/82.73 (88% of RLE)  Ankle dorsiflexion 5 5     Ankle plantarflexion 5 as seen in gait      Ankle inversion       Ankle eversion        (Blank rows = not tested)  FUNCTIONAL TESTS:  Evaluation on 09/22/2023: Rises from 18in chair with BUE use on armrests and LLE slightly in front to decrease knee bending  11/23/23 Y Balance Test:  Leg Length 32.75 bil RLE:  Anterior: 22.17  Posterior Medial: 34  Posterior Lateral: 29.67 LLE:   Anterior: 20.83  Posterior Medial: 31.33  Posterior Lateral: 31.67 Composite Score:  Right: 87%  Left: 85%  01/11/24 Y Balance Test:  Leg Length 32.75 bil RLE:  Anterior: 23.67  Posterior Medial: 36.33  Posterior Lateral: 32.33 LLE:   Anterior: 22.5  Posterior Medial: 36  Posterior Lateral: 30 Composite Score:  Right: 94%  Left: 90%  GAIT: Evaluation on 09/22/2023: Distance walked: clinical Assistive device utilized: Crutches Level of assistance: SBA Comments: crutches too short, PWB on LLE using 3pt gait, decreased cadence, decreased step length                                                                                                                                                                        TODAY'S TREATMENT 03/07/24 TherEx Recumbent bike, seat 4, level 10 for 8 minutes working on functional range and strength  TherAct LLE leg press 100# 3x10 Walking lunges working towards getting a knee touch to floor 30'x 4 Walking marches with bil 8# DB 30'x4 SL squat on sliders to 12:00/3:00/4:00/7:00 on Lt 2x5    02/22/24 TherEx Recumbent bike, seat 4, level 10 for 8 minutes working on functional range and strength  TherAct Heel taps from 6 box w/ focus on eccentric control in lowering for facilitation of stairs, 3x8 Ladder exercises for quick steps during sport activities:  Forward, lateral, lateral/backward, lateral/diagonal.  Patient was hesitant about speeding up after a few tries TRX: reverse lunge, 3x5.  Done to facilitate activities during sport  Neuro Star reaches standing on floor with focus on eccentric control and balance, 5 rounds ea LE SLS ball toss for balance   02/08/24 TherEx Recumbent bike L10 x 8 min Knee extension 20# bil concentric; LLE only eccentric 3x10  TherAct Leg Press bil 150# 3x10; LLE 75# 3x10 Squat jacks 3x10 Alternating tall kneeling to 1/2 kneeling 2x10 bil   01/25/24 TherEx Recumbent bike L10 x 8 min MMT with hand held dynamometry - see above for details Discussed/updated current HEP with trial of squat/calf raises and squat jumps, as well as curtsy lunge with hop to other side   01/11/24 TherEx Recumbent bike L6 x 8 min  Physical Performance Test Y Balance Test:  Leg Length 32.75 bil RLE:  Anterior: 23.67  Posterior Medial: 36.33  Posterior Lateral: 32.33 LLE:   Anterior: 22.5  Posterior Medial: 36  Posterior Lateral: 30 Composite Score:  Right: 94%  Left: 90%  TherAct TRX curtsy lunges 2x10 bil Deadlifts 35# on each cable 3x10 (standing on 8 block) Bil small jumps forward/backward working towards constant motion - able to perform 3-5 jumps continuously at this time with mild pain   PATIENT EDUCATION:  Education details: HEP, POC Person educated: Patient Education method: Programmer, multimedia, Facilities manager, Verbal cues, and Handouts Education comprehension: verbalized understanding, returned demonstration, and verbal cues required  HOME EXERCISE PROGRAM: Access Code: [email or web address] URL: [email or web address] Date:  09/30/2023 Prepared by: Lamar Ivory  Exercises - Ankle Alphabet in Elevation  - 2-3 x daily - 7 x weekly - 1 sets - 2-3 reps - elevate 10-15 min hold - Supine Active Straight Leg Raise (Mirrored)  - 2-3 x daily - 7 x weekly - 2 sets - 10 reps - 5 seconds hold - supine  quad set with towel roll under ankle  - 2-3 x daily - 7 x weekly - 2 sets - 10 reps - 5 seconds hold - Supine Heel Slide with Strap  - 2-3 x daily - 7 x weekly - 2 sets - 10 reps - 5 seconds hold - seated single straight leg raise   - 2-3 x daily - 7 x weekly - 2 sets - 10 reps - 5 seconds hold - Seated Quad Set  - 2-3 x daily - 7 x weekly - 2 sets - 10 reps - 5 seconds hold - Seated Knee Flexion Extension AROM   - 2-3 x daily - 7 x weekly - 2 sets - 10 reps - 5 seconds hold - Prone Hip Extension  - 1 x daily - 7 x weekly - 2 sets - 10 reps - 3 seconds hold  ASSESSMENT:  CLINICAL IMPRESSION Continued focus on strengthening and jujitsu related exercises with good tolerance.  She continues to have difficulty with rising from floor due to weakness.  Continue skilled PT at this time.    OBJECTIVE IMPAIRMENTS: Abnormal gait, decreased activity tolerance, decreased balance, decreased coordination, decreased endurance, decreased knowledge of condition, decreased knowledge of use of DME, decreased mobility, difficulty walking, decreased ROM, decreased strength, increased edema, increased muscle spasms, and pain.   ACTIVITY LIMITATIONS: carrying, lifting, bending, sitting, standing, squatting, stairs, and locomotion level  PARTICIPATION LIMITATIONS: meal prep, cleaning, laundry, driving, shopping, community activity, and occupation  PERSONAL FACTORS: Profession, Time since onset of injury/illness/exacerbation, and 1-2 comorbidities: see above are also affecting patient's functional outcome.   REHAB POTENTIAL: Good  CLINICAL DECISION MAKING: Stable/uncomplicated  EVALUATION COMPLEXITY: Low   GOALS: Goals reviewed with patient? Yes  SHORT TERM GOALS: (target date for Short term goals are 3 weeks 10/13/2023)   1.  Patient will demonstrate independent use of home exercise program to maintain progress from in clinic treatments. Goal status: MET 10/26/23  2.  Patient will demonstrate AROM 0-95deg   Goal status: MET 10/26/23   LONG TERM GOALS: (target date: 04/18/2024)   1. Patient will demonstrate/report pain at worst less than or equal to 2/10 to facilitate minimal limitation in daily activity secondary to pain symptoms.  Goal status: Met 11/09/2023   2. Patient will demonstrate independent use of home exercise program to facilitate ability to maintain/progress functional gains from skilled physical therapy services. Goal status: ONGOING (continue goal ) 01/25/24   3. Patient will demonstrate Patient specific functional scale avg > or = 7 to indicate reduced disability due to condition.  Goal status: ONGOING (continue goal ) 01/25/24  4.  Patient will demonstrate LLE MMT 5/5 throughout to faciltiate usual transfers, stairs, squatting at [facility] for daily life.  Goal status: MET 01/25/24   5.  Patient will demonstrate/report ability to perform 18 inch chair transfer without UE assist.  Goal status: MET 11/09/2023   6.  Patient will demonstrate up and down a flight of stairs with single hand rail with reciprocal gait pattern.  Goal status: ONGOING (continue goal ) 01/25/24   7.  patient will demonstrate AROM 0-120 for full range of motion  necessary to perform job tasks Goal Status: MET 11/09/23  8.  Improve Y balance test to > 90% composite score bil in order to work towards return to [employer]. Goal Status: Partially met (met on Rt) 01/11/24 (7/30 - continue goal)  9. LLE quad and hamstring strength improved to at least 93% of RLE for improved strength and participate in sport specific activities Goal Status: ongoing, 02/22/2024  PLAN:  PT FREQUENCY:  Every other week  PT DURATION: 12 weeks  PLANNED INTERVENTIONS: Can include 02853- PT Re-evaluation, 97110-Therapeutic exercises, 97530- Therapeutic activity, 97112- Neuromuscular re-education, 97535- Self Care, 97140- Manual therapy, [phone]- Gait training, [phone]- Orthotic Fit/training, [phone]- Canalith repositioning, [id number]- Aquatic  Therapy, [phone]- Electrical stimulation (unattended), [phone]- Electrical stimulation (manual), [id number] Physical performance testing, 97016- Vasopneumatic device, [id number]- Ultrasound, [id number]- Traction (mechanical), [id number]- Ionotophoresis 4mg /ml Dexamethasone , Blood Flow Restriction Therapy,  Patient/Family education, Balance training, Stair training, Taping, Dry Needling, Joint mobilization, Joint manipulation, Spinal manipulation, Spinal mobilization, Scar mobilization, Vestibular training, Visual/preceptual remediation/compensation, DME instructions, Cryotherapy, and Moist heat.  All performed as medically necessary.  All included unless contraindicated  PLAN FOR NEXT SESSION:  Jujitsu simulated activities as tolerated, quick feet activities, SLS balance, continue strengthening   Next MD visit: 05/16/24   Corean JULIANNA Ku, PT, DPT 03/07/24 11:10 AM

## 2024-03-21 ENCOUNTER — Encounter: Payer: Self-pay | Admitting: Physical Therapy

## 2024-03-21 ENCOUNTER — Ambulatory Visit: Admitting: Physical Therapy

## 2024-03-21 DIAGNOSIS — M25662 Stiffness of left knee, not elsewhere classified: Secondary | ICD-10-CM

## 2024-03-21 DIAGNOSIS — R2689 Other abnormalities of gait and mobility: Secondary | ICD-10-CM | POA: Diagnosis not present

## 2024-03-21 DIAGNOSIS — R600 Localized edema: Secondary | ICD-10-CM

## 2024-03-21 DIAGNOSIS — G8929 Other chronic pain: Secondary | ICD-10-CM

## 2024-03-21 DIAGNOSIS — M25562 Pain in left knee: Secondary | ICD-10-CM

## 2024-03-21 DIAGNOSIS — M6281 Muscle weakness (generalized): Secondary | ICD-10-CM | POA: Diagnosis not present

## 2024-03-21 NOTE — Therapy (Addendum)
 OUTPATIENT PHYSICAL THERAPY LOWER EXTREMITY TREATMENT   Patient Name: Jasmine Ortega MRN: [id number] DOB:12/05/1980, 43 y.o., female Today's Date: 03/21/2024  END OF SESSION:  PT End of Session - 03/21/24 1435     Visit Number 24    Number of Visits 26    Date for Recertification  04/18/24    Authorization Type [employer]    PT Start Time 1430    PT Stop Time 1513    PT Time Calculation (min) 43 min    Activity Tolerance Patient tolerated treatment well;No increased pain    Behavior During Therapy WFL for tasks assessed/performed             Past Medical History:  Diagnosis Date   Birth control    Chicken pox    Depression    GERD (gastroesophageal reflux disease)    Gestational diabetes    Past Surgical History:  Procedure Laterality Date   ALLOGRAFT APPLICATION Left 09/13/2023   Procedure: BONE -PATELLA-TENDON-BONE ALLOGRAFT;  Surgeon: Addie Cordella Hamilton, MD;  Location: [facility] OR;  Service: Orthopedics;  Laterality: Left;   ANTERIOR CRUCIATE LIGAMENT REPAIR Left 09/13/2023   Procedure: LEFT KNEE ACL TEAR RECONSTRUCTION;  Surgeon: Addie Cordella Hamilton, MD;  Location[facility] OR;  Service: Orthopedics;  Laterality: Left;   BONE MARROW ASPIRATION FOR SPINE FUSION ONLY (BMAC) Left 09/13/2023   Procedure: BONE MARROW ASPIRATE CONCENTRATE FROM ILLIAC CREST;  Surgeon: Addie Cordella Hamilton, MD;  Location: [facility] OR;  Service: Orthopedics;  Laterality: Left;   KNEE ARTHROSCOPY WITH MENISCAL REPAIR Left 09/13/2023   Procedure: MEDIAL MENISCAL TEAR REPAIR;  Surgeon: Addie Cordella Hamilton, MD;  Location: [facility] OR;  Service: Orthopedics;  Laterality: Left;   LASIK Bilateral    NO PAST SURGERIES     Patient Active Problem List   Diagnosis Date Noted   Left anterior cruciate ligament tear 09/18/2023   Acute medial meniscus tear of left knee 09/18/2023   Pain in left knee 08/03/2023   Chronic low back pain without sciatica 09/23/2021   Dysthymia 09/23/2021   Obesity (BMI 30-39.9) 09/14/2019    Hyperlipidemia 09/14/2019   Gastroesophageal reflux disease 04/27/2017   Attention deficit hyperactivity disorder (ADHD), predominantly inattentive type 03/05/2015   Loss of transverse plantar arch of right foot 09/18/2014    PCP: Joyce Norleen BROCKS, MD  REFERRING PROVIDER: Addie Cordella Hamilton, MD  REFERRING DIAG:  [phone] (ICD-10-CM) - Rupture of anterior cruciate ligament of left knee, initial encounter S83.242D (ICD-10-CM) - Other tear of medial meniscus, current injury, left knee, subsequent encounter  THERAPY DIAG:  Muscle weakness (generalized)  Chronic pain of left knee  Stiffness of left knee, not elsewhere classified  Other abnormalities of gait and mobility  Localized edema  Rationale for Evaluation and Treatment: Rehabilitation  ONSET DATE: 09/13/2023 s/p Lt ACL with medial meniscus  SUBJECTIVE:   SUBJECTIVE STATEMENT:  She turned at work feeling a twist motion to knee.    PERTINENT HISTORY: 3/18 surgery with Left LEFT KNEE ACL TEAR RECONSTRUCTION , BONE MARROW ASPIRATE CONCENTRATE, Left MEDIAL MENISCAL TEAR REPAIR, Left BONE -PATELLA-TENDON-BONE ALLOGRAFT Chronic LBP, HLD, GERD, ADHD  No hx of DVT- ok to do BFR  PAIN:  NPRS scale:    0/10 today Pain location: Lt knee Pain description: burning, numbness Aggravating factors: too much WB, prolonged postures, walking end of day with muscle fatigue Relieving factors: ice, rest, elevation, pain meds- ibprophin, roboxin at bed time  PRECAUTIONS: Other:  ROM & strengthening closed chain; ok to [facility]  in 7 days  WEIGHT BEARING RESTRICTIONS: ok to WBAT in 7 days per referral 09/28/2023. However will need clarification.   FALLS:  Has patient fallen in last 6 months? 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 Longitudinal tear posterior horn and posterior body of the medial meniscus without displaced fragment. 2. Chronic ACL tear. 3. Mild medial and lateral compartment osteoarthritis. 4. Prepatellar bursitis.    PATIENT SURVEYS:  Patient-Specific Activity Scoring Scheme  0 represents "unable to perform." 10 represents "able to perform at prior level. 0 1 2 3 4 5 6 7 8 9  10 (Date and Score)   Activity Eval 09/22/23 11/09/23  11/23/23 01/25/24  1. Full AROM of knee 1   6 8 8   2. Improved knee stability  1  7 8 7   3. Return to jujitsu 1 0 0 1  4. stairs 1 1 4 6   5. walking 1 9 9 9   Score 1/10 4.6 5.8 6.2   Total score = sum of the activity scores/number of activities Minimum detectable change (90%CI) for average score = 2 points Minimum detectable change (90%CI) for single activity score = 3 points  COGNITION: Evaluation on 09/22/2023:   Overall cognitive status: WFL    SENSATION: Evaluation on 09/22/2023:   WFL  EDEMA:  Evaluation on 09/22/2023:  Not measured but left knee is larger by observation.  She reports increased with activities.   POSTURE:  Evaluation on 09/22/2023:  No Significant postural limitations  PALPATION: Evaluation on 09/22/2023:  Tender to surgical sites and quadricep  LOWER EXTREMITY ROM:   ROM Left Eval 09/22/2023 Left  09/30/2023 Left 10/26/23  Knee flexion Supine with strap AA: 84 Active at 101 A: 120 (After heelslides 122)  Knee extension Supine quad set A: -7 Active -1    (Blank rows = not tested)  LOWER EXTREMITY MMT:  Lt not tested at eval due to surgery  MMT Right Eval 09/22/23 Left Eval 09/22/23 Left 11/09/23 HHD Left 11/23/23 HHD Right/Left 01/25/24  Hip flexion 5 4     Hip extension       Hip abduction       Hip adduction        Hip internal rotation       Hip external rotation       Knee flexion 5 3-/5 21.2 ppsi 43.6#   Knee extension 5 3-/5 42.1 ppsi 56.1# 94.15#/82.73 (88% of RLE)  Ankle dorsiflexion 5 5     Ankle plantarflexion 5 as seen in gait      Ankle inversion       Ankle eversion        (Blank rows = not tested)  FUNCTIONAL TESTS:  Evaluation on 09/22/2023: Rises from 18in chair with BUE use on armrests and LLE slightly in front to decrease knee bending  11/23/23 Y Balance Test:  Leg Length 32.75 bil RLE:  Anterior: 22.17  Posterior Medial: 34  Posterior Lateral: 29.67 LLE:   Anterior: 20.83  Posterior Medial: 31.33  Posterior Lateral: 31.67 Composite Score:  Right: 87%  Left: 85%  01/11/24 Y Balance Test:  Leg Length 32.75 bil RLE:  Anterior: 23.67  Posterior Medial: 36.33  Posterior Lateral: 32.33 LLE:   Anterior: 22.5  Posterior Medial: 36  Posterior Lateral: 30 Composite Score:  Right: 94%  Left: 90%  GAIT: Evaluation on 09/22/2023: Distance walked: clinical Assistive device utilized: Crutches Level of assistance: SBA Comments: crutches too short, PWB on LLE using 3pt gait, decreased cadence, decreased step length                                                                                                                                                                        TODAY'S  TREATMENT 03/21/2024 Therapeutic Exercise: standing LLE quad stretch with strap 20 sec hold 3 reps Braiding to left and to right progressively increasing speed.   Therapeutic Activities: Step up, over and step down on BOSU round side up (uneven requires stability) leading with ea LE so LLE raises/ lowers weight and accepts weight.  5 reps with BUE support and progressed to no UE support except intermittent touch LLE descending / eccentric 10 reps BOSU round side up (uneven requires stability) LLE side step up reach RLE across midline to point ankle/knee have to  stabilize medial/lateral - anterior reach with Lt knee ext and posterior reach with lt knee flexed. 10 reps.  Attempted stepping from 8 step onto rocker board (LLE) working ant/post and med/lat control but was not challenging.  BAPS board level 2 CW & CCW 10 reps ea with BUE support. Pt had difficulty with medial knee motion.  Plyometrics jumping off 4 box with BLEs for push-off & landing (goal is equal weight bearing) 10 reps forward, 10 reps to right and 10 reps to left.    TREATMENT 03/07/24 TherEx Recumbent bike, seat 4, level 10 for 8 minutes working on functional range and strength  TherAct LLE leg press 100# 3x10 Walking lunges working towards getting a knee touch to floor 30'x 4 Walking marches with bil 8# DB 30'x4 SL squat on sliders to 12:00/3:00/4:00/7:00 on Lt 2x5    02/22/24 TherEx Recumbent bike, seat 4, level 10 for 8 minutes working on functional range and strength  TherAct Heel taps from 6 box w/ focus on eccentric control in lowering for facilitation of stairs, 3x8 Ladder exercises for quick steps during sport activities: Forward, lateral, lateral/backward, lateral/diagonal.  Patient was hesitant about speeding up after a few tries TRX: reverse lunge, 3x5.  Done to facilitate activities during sport  Neuro Star reaches standing on floor with focus on eccentric control and balance, 5 rounds ea LE SLS ball toss for balance   02/08/24 TherEx Recumbent bike L10 x 8 min Knee extension 20# bil concentric; LLE only eccentric 3x10  TherAct Leg Press  bil 150# 3x10; LLE 75# 3x10 Squat jacks 3x10 Alternating tall kneeling to 1/2 kneeling 2x10 bil   01/25/24 TherEx Recumbent bike L10 x 8 min MMT with hand held dynamometry - see above for details Discussed/updated current HEP with trial of squat/calf raises and squat jumps, as well as curtsy lunge with hop to other side    PATIENT EDUCATION:  Education details: HEP, POC Person educated: Patient Education  method: Programmer, multimedia, Demonstration, Verbal cues, and Handouts Education comprehension: verbalized understanding, returned demonstration, and verbal cues required  HOME EXERCISE PROGRAM: Access Code: [email or web address] URL: [email or web address] Date: 09/30/2023 Prepared by: Lamar Ivory  Exercises - Ankle Alphabet in Elevation  - 2-3 x daily - 7 x weekly - 1 sets - 2-3 reps - elevate 10-15 min hold - Supine Active Straight Leg Raise (Mirrored)  - 2-3 x daily - 7 x weekly - 2 sets - 10 reps - 5 seconds hold - supine quad set with towel roll under ankle  - 2-3 x daily - 7 x weekly - 2 sets - 10 reps - 5 seconds hold - Supine Heel Slide with Strap  - 2-3 x daily - 7 x weekly - 2 sets - 10 reps - 5 seconds hold - seated single straight leg raise   - 2-3 x daily - 7 x weekly - 2 sets - 10 reps - 5 seconds hold - Seated Quad Set  - 2-3 x daily - 7 x weekly - 2 sets - 10 reps - 5 seconds hold - Seated Knee Flexion Extension AROM   - 2-3 x daily - 7 x weekly - 2 sets - 10 reps - 5 seconds hold - Prone Hip Extension  - 1 x daily - 7 x weekly - 2 sets - 10 reps - 3 seconds hold  ASSESSMENT:  CLINICAL IMPRESSION Continued focus on strengthening and jujitsu related exercises with good tolerance.  She continues to have difficulty with weight bearing functional quad activities especially eccentric. Continue skilled PT at this time.    OBJECTIVE IMPAIRMENTS: Abnormal gait, decreased activity tolerance, decreased balance, decreased coordination, decreased endurance, decreased knowledge of condition, decreased knowledge of use of DME, decreased mobility, difficulty walking, decreased ROM, decreased strength, increased edema, increased muscle spasms, and pain.   ACTIVITY LIMITATIONS: carrying, lifting, bending, sitting, standing, squatting, stairs, and locomotion level  PARTICIPATION LIMITATIONS: meal prep, cleaning, laundry, driving, shopping, community activity, and occupation  PERSONAL FACTORS:  Profession, Time since onset of injury/illness/exacerbation, and 1-2 comorbidities: see above are also affecting patient's functional outcome.   REHAB POTENTIAL: Good  CLINICAL DECISION MAKING: Stable/uncomplicated  EVALUATION COMPLEXITY: Low   GOALS: Goals reviewed with patient? Yes  SHORT TERM GOALS: (target date for Short term goals are 3 weeks 10/13/2023)   1.  Patient will demonstrate independent use of home exercise program to maintain progress from in clinic treatments. Goal status: MET 10/26/23  2.  Patient will demonstrate AROM 0-95deg  Goal status: MET 10/26/23   LONG TERM GOALS: (target date: 04/18/2024)   1. Patient will demonstrate/report pain at worst less than or equal to 2/10 to facilitate minimal limitation in daily activity secondary to pain symptoms.  Goal status: Met 11/09/2023   2. Patient will demonstrate independent use of home exercise program to facilitate ability to maintain/progress functional gains from skilled physical therapy services. Goal status: : ONGOING   03/21/2024   3. Patient will demonstrate Patient specific functional scale avg > or = 7 to indicate reduced disability due to condition.  Goal status:: ONGOING   03/21/2024  4.  Patient will demonstrate LLE MMT 5/5 throughout to faciltiate usual transfers, stairs, squatting at [facility] for daily life.  Goal status: MET 01/25/24   5.  Patient will demonstrate/report ability to perform 18 inch chair transfer without UE assist.  Goal status: MET 11/09/2023   6.  Patient will demonstrate up and down a flight of stairs with single hand rail with reciprocal gait pattern.  Goal status: ONGOING   03/21/2024   7.  patient will demonstrate AROM 0-120 for full range of motion necessary to perform job tasks Goal Status: MET 11/09/23  8.  Improve Y balance test to > 90% composite score bil in order to work towards return to [employer]. Goal Status: Partially met (met on Rt) 01/11/24 (7/30 - continue goal)  9. LLE  quad and hamstring strength improved to at least 93% of RLE for improved strength and participate in sport specific activities Goal Status: ongoing, 03/21/2024  PLAN:  PT FREQUENCY:  Every other week  PT DURATION: 12 weeks  PLANNED INTERVENTIONS: Can include 02853- PT Re-evaluation, 97110-Therapeutic exercises, 97530- Therapeutic activity, 97112- Neuromuscular re-education, 97535- Self Care, 97140- Manual therapy, [phone]- Gait training, [phone]- Orthotic Fit/training, [phone]- Canalith repositioning, [id number]- Aquatic Therapy, [phone]- Electrical stimulation (unattended), [phone]- Electrical stimulation (manual), [id number] Physical performance testing, 97016- Vasopneumatic device, [id number]- Ultrasound, [id number]- Traction (mechanical), [id number]- Ionotophoresis 4mg /ml Dexamethasone , Blood Flow Restriction Therapy,  Patient/Family education, Balance training, Stair training, Taping, Dry Needling, Joint mobilization, Joint manipulation, Spinal manipulation, Spinal mobilization, Scar mobilization, Vestibular training, Visual/preceptual remediation/compensation, DME instructions, Cryotherapy, and Moist heat.  All performed as medically necessary.  All included unless contraindicated  PLAN FOR NEXT SESSION:  check LTGs over next 2 visits,   Jujitsu simulated activities as tolerated, quick feet activities, SLS balance, continue strengthening   Next MD visit: 05/16/24    Grayce Spatz, PT, DPT 03/21/2024, 5:41 PM

## 2024-03-24 ENCOUNTER — Other Ambulatory Visit: Payer: Self-pay | Admitting: Family Medicine

## 2024-03-24 DIAGNOSIS — F341 Dysthymic disorder: Secondary | ICD-10-CM

## 2024-03-26 ENCOUNTER — Other Ambulatory Visit (HOSPITAL_COMMUNITY): Payer: Self-pay

## 2024-03-26 MED ORDER — ESCITALOPRAM OXALATE 20 MG PO TABS
20.0000 mg | ORAL_TABLET | Freq: Every day | ORAL | 0 refills | Status: DC
  Filled 2024-03-26 – 2024-03-27 (×2): qty 90, 90d supply, fill #0

## 2024-03-26 NOTE — Telephone Encounter (Signed)
 Is this okay to refill?

## 2024-03-27 ENCOUNTER — Other Ambulatory Visit (HOSPITAL_COMMUNITY): Payer: Self-pay

## 2024-04-04 ENCOUNTER — Ambulatory Visit: Admitting: Physical Therapy

## 2024-04-04 ENCOUNTER — Encounter: Payer: Self-pay | Admitting: Physical Therapy

## 2024-04-04 DIAGNOSIS — H524 Presbyopia: Secondary | ICD-10-CM | POA: Diagnosis not present

## 2024-04-04 DIAGNOSIS — R2689 Other abnormalities of gait and mobility: Secondary | ICD-10-CM | POA: Diagnosis not present

## 2024-04-04 DIAGNOSIS — M25562 Pain in left knee: Secondary | ICD-10-CM

## 2024-04-04 DIAGNOSIS — M25662 Stiffness of left knee, not elsewhere classified: Secondary | ICD-10-CM | POA: Diagnosis not present

## 2024-04-04 DIAGNOSIS — R600 Localized edema: Secondary | ICD-10-CM

## 2024-04-04 DIAGNOSIS — M6281 Muscle weakness (generalized): Secondary | ICD-10-CM | POA: Diagnosis not present

## 2024-04-04 DIAGNOSIS — G8929 Other chronic pain: Secondary | ICD-10-CM

## 2024-04-04 NOTE — Therapy (Signed)
 OUTPATIENT PHYSICAL THERAPY LOWER EXTREMITY TREATMENT   Patient Name: Jasmine Ortega MRN: [id number] DOB:06/06/1981, 43 y.o., female Today's Date: 04/04/2024  END OF SESSION:  PT End of Session - 04/04/24 0804     Visit Number 25    Number of Visits 26    Date for Recertification  04/18/24    Authorization Type [employer]    PT Start Time [phone]    PT Stop Time [phone]    PT Time Calculation (min) 49 min    Activity Tolerance Patient tolerated treatment well;No increased pain    Behavior During Therapy WFL for tasks assessed/performed              Past Medical History:  Diagnosis Date   Birth control    Chicken pox    Depression    GERD (gastroesophageal reflux disease)    Gestational diabetes    Past Surgical History:  Procedure Laterality Date   ALLOGRAFT APPLICATION Left 09/13/2023   Procedure: BONE -PATELLA-TENDON-BONE ALLOGRAFT;  Surgeon: Addie Cordella Hamilton, MD;  Location: [facility] OR;  Service: Orthopedics;  Laterality: Left;   ANTERIOR CRUCIATE LIGAMENT REPAIR Left 09/13/2023   Procedure: LEFT KNEE ACL TEAR RECONSTRUCTION;  Surgeon: Addie Cordella Hamilton, MD;  Location: [facility] OR;  Service: Orthopedics;  Laterality: Left;   BONE MARROW ASPIRATION FOR SPINE FUSION ONLY (BMAC) Left 09/13/2023   Procedure: BONE MARROW ASPIRATE CONCENTRATE FROM ILLIAC CREST;  Surgeon: Addie Cordella Hamilton, MD;  Location: [facility] OR;  Service: Orthopedics;  Laterality: Left;   KNEE ARTHROSCOPY WITH MENISCAL REPAIR Left 09/13/2023   Procedure: MEDIAL MENISCAL TEAR REPAIR;  Surgeon: Addie Cordella Hamilton, MD;  Location: [facility] OR;  Service: Orthopedics;  Laterality: Left;   LASIK Bilateral    NO PAST SURGERIES     Patient Active Problem List   Diagnosis Date Noted   Left anterior cruciate ligament tear 09/18/2023   Acute medial meniscus tear of left knee 09/18/2023   Pain in left knee 08/03/2023   Chronic low back pain without sciatica 09/23/2021   Dysthymia 09/23/2021   Obesity (BMI 30-39.9) 09/14/2019    Hyperlipidemia 09/14/2019   Gastroesophageal reflux disease 04/27/2017   Attention deficit hyperactivity disorder (ADHD), predominantly inattentive type 03/05/2015   Loss of transverse plantar arch of right foot 09/18/2014    PCP: Joyce Norleen BROCKS, MD  REFERRING PROVIDER: Addie Cordella Hamilton, MD  REFERRING DIAG:  [phone] (ICD-10-CM) - Rupture of anterior cruciate ligament of left knee, initial encounter S83.242D (ICD-10-CM) - Other tear of medial meniscus, current injury, left knee, subsequent encounter  THERAPY DIAG:  Muscle weakness (generalized)  Chronic pain of left knee  Stiffness of left knee, not elsewhere classified  Other abnormalities of gait and mobility  Localized edema  Rationale for Evaluation and Treatment: Rehabilitation  ONSET DATE: 09/13/2023 s/p Lt ACL with medial meniscus  SUBJECTIVE:   SUBJECTIVE STATEMENT:  She turned at work feeling a twist motion to knee.    PERTINENT HISTORY: 3/18 surgery with Left LEFT KNEE ACL TEAR RECONSTRUCTION , BONE MARROW ASPIRATE CONCENTRATE, Left MEDIAL MENISCAL TEAR REPAIR, Left BONE -PATELLA-TENDON-BONE ALLOGRAFT Chronic LBP, HLD, GERD, ADHD  No hx of DVT- ok to do BFR  PAIN:  NPRS scale:    0/10 today Pain location: Lt knee Pain description: burning, numbness Aggravating factors: too much WB, prolonged postures, walking end of day with muscle fatigue Relieving factors: ice, rest, elevation, pain meds- ibprophin, roboxin at bed time  PRECAUTIONS: Other:  ROM & strengthening closed chain; ok to  WBAT in 7 days  WEIGHT BEARING RESTRICTIONS: ok to WBAT in 7 days per referral 09/28/2023. However will need clarification.   FALLS:  Has patient fallen in last 6 months? 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 Longitudinal tear posterior horn and posterior body of the medial meniscus without displaced fragment. 2. Chronic ACL tear. 3. Mild medial and lateral compartment osteoarthritis. 4. Prepatellar bursitis.    PATIENT SURVEYS:  Patient-Specific Activity Scoring Scheme  0 represents "unable to perform." 10 represents "able to perform at prior level. 0 1 2 3 4 5 6 7 8 9  10 (Date and Score)   Activity Eval 09/22/23 11/09/23  11/23/23 01/25/24  1. Full AROM of knee 1   6 8 8   2. Improved knee stability  1  7 8 7   3. Return to jujitsu 1 0 0 1  4. stairs 1 1 4 6   5. walking 1 9 9 9   Score 1/10 4.6 5.8 6.2   Total score = sum of the activity scores/number of activities Minimum detectable change (90%CI) for average score = 2 points Minimum detectable change (90%CI) for single activity score = 3 points  COGNITION: Evaluation on 09/22/2023:   Overall cognitive status: WFL    SENSATION: Evaluation on 09/22/2023:   WFL  EDEMA:  Evaluation on 09/22/2023:  Not measured but left knee is larger by observation.  She reports increased with activities.   POSTURE:  Evaluation on 09/22/2023:  No Significant postural limitations  PALPATION: Evaluation on 09/22/2023:  Tender to surgical sites and quadricep  LOWER EXTREMITY ROM:   ROM Left Eval 09/22/2023 Left  09/30/2023 Left 10/26/23  Knee flexion Supine with strap AA: 84 Active at 101 A: 120 (After heelslides 122)  Knee extension Supine quad set A: -7 Active -1    (Blank rows = not tested)  LOWER EXTREMITY MMT:  Lt not tested at eval due to surgery  MMT Right Eval 09/22/23 Left Eval 09/22/23 Left 11/09/23 HHD Left 11/23/23 HHD Right/Left 01/25/24 Right/Left 04/04/24  Hip flexion 5 4      Knee flexion 5 3-/5 21.2 ppsi 43.6#   52.6#/45.25# (85% of RLE)  Knee extension 5 3-/5 42.1 ppsi 56.1# 94.15#/82.73 (88% of RLE) 98#/94.85# (97% of RLE)  Ankle dorsiflexion 5 5      Ankle plantarflexion 5 as seen in gait       Ankle inversion        Ankle eversion         (Blank rows = not tested)  FUNCTIONAL TESTS:  Evaluation on 09/22/2023: Rises from 18in chair with BUE use on armrests and LLE slightly in front to decrease knee bending  11/23/23 Y Balance Test:  Leg Length 32.75 bil RLE:  Anterior: 22.17  Posterior Medial: 34  Posterior Lateral: 29.67 LLE:   Anterior: 20.83  Posterior Medial: 31.33  Posterior Lateral: 31.67 Composite Score:  Right: 87%  Left: 85%  01/11/24 Y Balance Test:  Leg Length 32.75 bil RLE:  Anterior: 23.67  Posterior Medial: 36.33  Posterior Lateral: 32.33 LLE:   Anterior:  22.5  Posterior Medial: 36  Posterior Lateral: 30 Composite Score:  Right: 94%  Left: 90%  GAIT: Evaluation on 09/22/2023: Distance walked: clinical Assistive device utilized: Crutches Level of assistance: SBA Comments: crutches too short, PWB on LLE using 3pt gait, decreased cadence, decreased step length                                                                                                                                                                        TODAY'S  TREATMENT 04/04/24 TherAct Leg press bil 150# 3x10; LLE only 75# 3x10 RLE single limb deadlift 3x10; 15# Step downs on ramp leading with RLE for Lt eccentric control with 15# KB 3x10   TherEx MMT with dynamometer - see above for details; increased time for reps and education on current strength status   03/21/24 Therapeutic Exercise: standing LLE quad stretch with strap 20 sec hold 3 reps Braiding to left and to right progressively increasing speed.   Therapeutic Activities: Step up, over and step down on BOSU round side up (uneven requires stability) leading with ea LE so LLE raises/ lowers weight and  accepts weight.  5 reps with BUE support and progressed to no UE support except intermittent touch LLE descending / eccentric 10 reps BOSU round side up (uneven requires stability) LLE side step up reach RLE across midline to point ankle/knee have to stabilize medial/lateral - anterior reach with Lt knee ext and posterior reach with lt knee flexed. 10 reps.  Attempted stepping from 8 step onto rocker board (LLE) working ant/post and med/lat control but was not challenging.  BAPS board level 2 CW & CCW 10 reps ea with BUE support. Pt had difficulty with medial knee motion.  Plyometrics jumping off 4 box with BLEs for push-off & landing (goal is equal weight bearing) 10 reps forward, 10 reps to right and 10 reps to left.    03/07/24 TherEx Recumbent bike, seat 4, level 10 for 8 minutes working on functional range and strength  TherAct LLE leg press 100# 3x10 Walking lunges working towards getting a knee touch to floor 30'x 4 Walking marches with bil 8# DB 30'x4 SL squat on sliders to 12:00/3:00/4:00/7:00 on Lt 2x5    PATIENT EDUCATION:  Education details: HEP, POC Person educated: Patient Education method: Programmer, multimedia, Demonstration, Verbal cues, and Handouts Education comprehension: verbalized understanding, returned demonstration, and verbal cues required  HOME EXERCISE PROGRAM: Access Code: [email or web address] URL: [email or web address] Date: 09/30/2023 Prepared by: Lamar Ivory  Exercises - Ankle Alphabet in Elevation  - 2-3 x daily - 7 x weekly - 1 sets - 2-3 reps - elevate 10-15 min hold - Supine Active Straight Leg Raise (Mirrored)  - 2-3 x daily - 7 x weekly -  2 sets - 10 reps - 5 seconds hold - supine quad set with towel roll under ankle  - 2-3 x daily - 7 x weekly - 2 sets - 10 reps - 5 seconds hold - Supine Heel Slide with Strap  - 2-3 x daily - 7 x weekly - 2 sets - 10 reps - 5 seconds hold - seated single straight leg raise   - 2-3 x daily - 7 x weekly - 2 sets -  10 reps - 5 seconds hold - Seated Quad Set  - 2-3 x daily - 7 x weekly - 2 sets - 10 reps - 5 seconds hold - Seated Knee Flexion Extension AROM   - 2-3 x daily - 7 x weekly - 2 sets - 10 reps - 5 seconds hold - Prone Hip Extension  - 1 x daily - 7 x weekly - 2 sets - 10 reps - 3 seconds hold  ASSESSMENT:  CLINICAL IMPRESSION Pt has met quad strength goal at this time and is overall doing well with PT.  Continue to work on eccentric control of quads and hamstrings to maximize function.  Continue skilled PT.   OBJECTIVE IMPAIRMENTS: Abnormal gait, decreased activity tolerance, decreased balance, decreased coordination, decreased endurance, decreased knowledge of condition, decreased knowledge of use of DME, decreased mobility, difficulty walking, decreased ROM, decreased strength, increased edema, increased muscle spasms, and pain.   ACTIVITY LIMITATIONS: carrying, lifting, bending, sitting, standing, squatting, stairs, and locomotion level  PARTICIPATION LIMITATIONS: meal prep, cleaning, laundry, driving, shopping, community activity, and occupation  PERSONAL FACTORS: Profession, Time since onset of injury/illness/exacerbation, and 1-2 comorbidities: see above are also affecting patient's functional outcome.   REHAB POTENTIAL: Good  CLINICAL DECISION MAKING: Stable/uncomplicated  EVALUATION COMPLEXITY: Low   GOALS: Goals reviewed with patient? Yes  SHORT TERM GOALS: (target date for Short term goals are 3 weeks 10/13/2023)   1.  Patient will demonstrate independent use of home exercise program to maintain progress from in clinic treatments. Goal status: MET 10/26/23  2.  Patient will demonstrate AROM 0-95deg  Goal status: MET 10/26/23   LONG TERM GOALS: (target date: 04/18/2024)   1. Patient will demonstrate/report pain at worst less than or equal to 2/10 to facilitate minimal limitation in daily activity secondary to pain symptoms.  Goal status: Met 11/09/2023   2. Patient will  demonstrate independent use of home exercise program to facilitate ability to maintain/progress functional gains from skilled physical therapy services. Goal status: : ONGOING   03/21/2024   3. Patient will demonstrate Patient specific functional scale avg > or = 7 to indicate reduced disability due to condition.  Goal status:: ONGOING   03/21/2024  4.  Patient will demonstrate LLE MMT 5/5 throughout to faciltiate usual transfers, stairs, squatting at [facility] for daily life.  Goal status: MET 01/25/24   5.  Patient will demonstrate/report ability to perform 18 inch chair transfer without UE assist.  Goal status: MET 11/09/2023   6.  Patient will demonstrate up and down a flight of stairs with single hand rail with reciprocal gait pattern.  Goal status: ONGOING   03/21/2024   7.  patient will demonstrate AROM 0-120 for full range of motion necessary to perform job tasks Goal Status: MET 11/09/23  8.  Improve Y balance test to > 90% composite score bil in order to work towards return to [employer]. Goal Status: Partially met (met on Rt) 01/11/24 (7/30 - continue goal)  9. LLE quad and hamstring strength  improved to at least 93% of RLE for improved strength and participate in sport specific activities Goal Status: PARTIALLY MET (MET EXTENSION) 04/04/24  PLAN:  PT FREQUENCY:  Every other week  PT DURATION: 12 weeks  PLANNED INTERVENTIONS: Can include 02853- PT Re-evaluation, 97110-Therapeutic exercises, 97530- Therapeutic activity, 97112- Neuromuscular re-education, 97535- Self Care, 97140- Manual therapy, [phone]- Gait training, [phone]- Orthotic Fit/training, [phone]- Canalith repositioning, [id number]- Aquatic Therapy, [phone]- Electrical stimulation (unattended), [phone]- Electrical stimulation (manual), [id number] Physical performance testing, 97016- Vasopneumatic device, [id number]- Ultrasound, [id number]- Traction (mechanical), [id number]- Ionotophoresis 4mg /ml Dexamethasone , Blood Flow Restriction Therapy,  Patient/Family  education, Balance training, Stair training, Taping, Dry Needling, Joint mobilization, Joint manipulation, Spinal manipulation, Spinal mobilization, Scar mobilization, Vestibular training, Visual/preceptual remediation/compensation, DME instructions, Cryotherapy, and Moist heat.  All performed as medically necessary.  All included unless contraindicated  PLAN FOR NEXT SESSION: anticipate d/c next visit   Next MD visit: 05/16/24    Corean JULIANNA Ku, PT, DPT 04/04/2024, 9:05 AM

## 2024-04-05 ENCOUNTER — Encounter: Payer: Self-pay | Admitting: Family Medicine

## 2024-04-18 ENCOUNTER — Ambulatory Visit: Admitting: Physical Therapy

## 2024-04-18 ENCOUNTER — Encounter: Payer: Self-pay | Admitting: Physical Therapy

## 2024-04-18 DIAGNOSIS — M6281 Muscle weakness (generalized): Secondary | ICD-10-CM

## 2024-04-18 DIAGNOSIS — R600 Localized edema: Secondary | ICD-10-CM

## 2024-04-18 DIAGNOSIS — M25662 Stiffness of left knee, not elsewhere classified: Secondary | ICD-10-CM | POA: Diagnosis not present

## 2024-04-18 DIAGNOSIS — R2689 Other abnormalities of gait and mobility: Secondary | ICD-10-CM

## 2024-04-18 DIAGNOSIS — G8929 Other chronic pain: Secondary | ICD-10-CM

## 2024-04-18 DIAGNOSIS — M25562 Pain in left knee: Secondary | ICD-10-CM

## 2024-04-18 NOTE — Therapy (Signed)
 OUTPATIENT PHYSICAL THERAPY LOWER EXTREMITY TREATMENT DISCHARGE SUMMARY   Patient Name: Jasmine Ortega MRN: [id number] DOB:1981-01-19, 43 y.o., female Today's Date: 04/18/2024  END OF SESSION:  PT End of Session - 04/18/24 0805     Visit Number 26    Number of Visits 26    Date for Recertification  04/18/24    Authorization Type [employer]    PT Start Time 0802    PT Stop Time [phone]    PT Time Calculation (min) 36 min    Activity Tolerance Patient tolerated treatment well;No increased pain    Behavior During Therapy WFL for tasks assessed/performed               Past Medical History:  Diagnosis Date   Birth control    Chicken pox    Depression    GERD (gastroesophageal reflux disease)    Gestational diabetes    Past Surgical History:  Procedure Laterality Date   ALLOGRAFT APPLICATION Left 09/13/2023   Procedure: BONE -PATELLA-TENDON-BONE ALLOGRAFT;  Surgeon: Addie Cordella Hamilton, MD;  Location: [facility] OR;  Service: Orthopedics;  Laterality: Left;   ANTERIOR CRUCIATE LIGAMENT REPAIR Left 09/13/2023   Procedure: LEFT KNEE ACL TEAR RECONSTRUCTION;  Surgeon: Addie Cordella Hamilton, MD;  Location: [facility] OR;  Service: Orthopedics;  Laterality: Left;   BONE MARROW ASPIRATION FOR SPINE FUSION ONLY (BMAC) Left 09/13/2023   Procedure: BONE MARROW ASPIRATE CONCENTRATE FROM ILLIAC CREST;  Surgeon: Addie Cordella Hamilton, MD;  Location: [facility] OR;  Service: Orthopedics;  Laterality: Left;   KNEE ARTHROSCOPY WITH MENISCAL REPAIR Left 09/13/2023   Procedure: MEDIAL MENISCAL TEAR REPAIR;  Surgeon: Addie Cordella Hamilton, MD;  Location: [facility] OR;  Service: Orthopedics;  Laterality: Left;   LASIK Bilateral    NO PAST SURGERIES     Patient Active Problem List   Diagnosis Date Noted   Left anterior cruciate ligament tear 09/18/2023   Acute medial meniscus tear of left knee 09/18/2023   Pain in left knee 08/03/2023   Chronic low back pain without sciatica 09/23/2021   Dysthymia 09/23/2021   Obesity (BMI  30-39.9) 09/14/2019   Hyperlipidemia 09/14/2019   Gastroesophageal reflux disease 04/27/2017   Attention deficit hyperactivity disorder (ADHD), predominantly inattentive type 03/05/2015   Loss of transverse plantar arch of right foot 09/18/2014    PCP: Joyce Norleen BROCKS, MD  REFERRING PROVIDER: Addie Cordella Hamilton, MD  REFERRING DIAG:  [phone] (ICD-10-CM) - Rupture of anterior cruciate ligament of left knee, initial encounter S83.242D (ICD-10-CM) - Other tear of medial meniscus, current injury, left knee, subsequent encounter  THERAPY DIAG:  Muscle weakness (generalized)  Chronic pain of left knee  Stiffness of left knee, not elsewhere classified  Other abnormalities of gait and mobility  Localized edema  Rationale for Evaluation and Treatment: Rehabilitation  ONSET DATE: 09/13/2023 s/p Lt ACL with medial meniscus  SUBJECTIVE:   SUBJECTIVE STATEMENT:  Feels ready to d/c today   PERTINENT HISTORY: 3/18 surgery with Left LEFT KNEE ACL TEAR RECONSTRUCTION , BONE MARROW ASPIRATE CONCENTRATE, Left MEDIAL MENISCAL TEAR REPAIR, Left BONE -PATELLA-TENDON-BONE ALLOGRAFT Chronic LBP, HLD, GERD, ADHD  No hx of DVT- ok to do BFR  PAIN:  NPRS scale:    0/10 today Pain location: Lt knee Pain description: burning, numbness Aggravating factors: too much WB, prolonged postures, walking end of day with muscle fatigue Relieving factors: ice, rest, elevation, pain meds- ibprophin, roboxin at bed time  PRECAUTIONS: Other:  ROM & strengthening closed chain; ok to WBAT in 7  days  WEIGHT BEARING RESTRICTIONS: ok to WBAT in 7 days per referral 09/28/2023. However will need clarification.   FALLS:  Has patient fallen in last 6 months? 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 Longitudinal tear posterior horn and posterior body of the medial meniscus without displaced fragment. 2. Chronic ACL tear. 3. Mild medial and lateral compartment osteoarthritis. 4. Prepatellar bursitis.    PATIENT SURVEYS:  Patient-Specific Activity Scoring Scheme  0 represents "unable to perform." 10 represents "able to perform at prior level. 0 1 2 3 4 5 6 7 8 9  10 (Date and Score)   Activity Eval 09/22/23 11/09/23  11/23/23 01/25/24 04/18/24  1. Full AROM of knee 1   6 8 8 9   2. Improved knee stability  1  7 8 7 9   3. Return to jujitsu 1 0 0 1 5  4. stairs 1 1 4 6 7   5. walking 1 9 9 9 10   Score 1/10 4.6 5.8 6.2 8   Total score = sum of the activity scores/number of activities Minimum detectable change (90%CI) for average score = 2 points Minimum detectable change (90%CI) for single activity score = 3 points  COGNITION: Evaluation on 09/22/2023:   Overall cognitive status: WFL    SENSATION: Evaluation on 09/22/2023:   WFL  EDEMA:  Evaluation on 09/22/2023:  Not measured but left knee is larger by observation.  She reports increased with activities.   POSTURE:  Evaluation on 09/22/2023:  No Significant postural limitations  PALPATION: Evaluation on 09/22/2023:  Tender to surgical sites and quadricep  LOWER EXTREMITY ROM:   ROM Left Eval 09/22/2023 Left  09/30/2023 Left 10/26/23  Knee flexion Supine with strap AA: 84 Active at 101 A: 120 (After heelslides 122)  Knee extension Supine quad set A: -7 Active -1    (Blank rows = not tested)  LOWER EXTREMITY MMT:  Lt not tested at eval due to surgery  MMT Right Eval 09/22/23 Left Eval 09/22/23 Left 11/09/23 HHD Left 11/23/23 HHD Right/Left 01/25/24 Right/Left 04/04/24 Right 04/18/24  Left 04/18/2024  Hip  flexion 5 4        Knee flexion 5 3-/5 21.2 ppsi 43.6#  52.6#/45.25# (85% of RLE) 5/5 56.7, 59.5 lbs 5/5 52.3, 50.7 lbs (89%)  Knee extension 5 3-/5 42.1 ppsi 56.1# 94.15#/82.73 (88% of RLE) 98#/94.85# (97% of RLE)    Ankle dorsiflexion 5 5        Ankle plantarflexion 5 as seen in gait         Ankle inversion          Ankle eversion           (Blank rows = not tested)  FUNCTIONAL TESTS:  Evaluation on 09/22/2023: Rises from 18in chair with BUE use on armrests and LLE slightly in front to decrease knee bending  11/23/23 Y Balance Test:  Leg Length 32.75 bil RLE:  Anterior: 22.17  Posterior Medial: 34  Posterior Lateral: 29.67 LLE:   Anterior: 20.83  Posterior Medial: 31.33  Posterior Lateral: 31.67 Composite Score:  Right:  87%  Left: 85%  01/11/24 Y Balance Test:  Leg Length 32.75 bil RLE:  Anterior: 23.67  Posterior Medial: 36.33  Posterior Lateral: 32.33 LLE:   Anterior: 22.5  Posterior Medial: 36  Posterior Lateral: 30 Composite Score:  Right: 94%  Left: 90%    04/18/24 Y Balance Test:  Leg Length 32.75 bil RLE:  Anterior: 21.5  Posterior Medial: 31.25  Posterior Lateral: 36.5 LLE:   Anterior: 22.33  Posterior Medial: 33.58  Posterior Lateral: 35 Composite Score:  Right: 91%  Left: 93%  GAIT: Evaluation on 09/22/2023: Distance walked: clinical Assistive device utilized: Crutches Level of assistance: SBA Comments: crutches too short, PWB on LLE using 3pt gait, decreased cadence, decreased step length                                                                                                                                                                        TODAY'S  TREATMENT 04/18/24 Recumbent bike L7 x 8 min Hamstring strength assessment - see above for details Y balance test - see above for details Negotiated stairs without UE support reciprocally independently   04/04/24 TherAct Leg press bil 150# 3x10;  LLE only 75# 3x10 RLE single limb deadlift 3x10; 15# Step downs on ramp leading with RLE for Lt eccentric control with 15# KB 3x10   TherEx MMT with dynamometer - see above for details; increased time for reps and education on current strength status   03/21/24 Therapeutic Exercise: standing LLE quad stretch with strap 20 sec hold 3 reps Braiding to left and to right progressively increasing speed.   Therapeutic Activities: Step up, over and step down on BOSU round side up (uneven requires stability) leading with ea LE so LLE raises/ lowers weight and accepts weight.  5 reps with BUE support and progressed to no UE support except intermittent touch LLE descending / eccentric 10 reps BOSU round side up (uneven requires stability) LLE side step up reach RLE across midline to point ankle/knee have to stabilize medial/lateral - anterior reach with Lt knee ext and posterior reach with lt knee flexed. 10 reps.  Attempted stepping from 8 step onto rocker board (LLE) working ant/post and med/lat control but was not challenging.  BAPS board level 2 CW & CCW 10 reps ea with BUE support. Pt had difficulty with medial knee motion.  Plyometrics jumping off 4 box with BLEs for push-off & landing (goal is equal weight bearing) 10 reps forward, 10 reps to right and 10 reps to left.    03/07/24 TherEx Recumbent bike, seat 4, level 10 for 8 minutes working on functional range and strength  TherAct LLE leg press 100# 3x10 Walking lunges working towards getting a knee touch to floor 30'x  4 Walking marches with bil 8# DB 30'x4 SL squat on sliders to 12:00/3:00/4:00/7:00 on Lt 2x5    PATIENT EDUCATION:  Education details: HEP, POC Person educated: Patient Education method: Programmer, multimedia, Demonstration, Verbal cues, and Handouts Education comprehension: verbalized understanding, returned demonstration, and verbal cues required  HOME EXERCISE PROGRAM: Access Code: [email or web address] URL:  [email or web address] Date: 09/30/2023 Prepared by: Lamar Ivory  Exercises - Ankle Alphabet in Elevation  - 2-3 x daily - 7 x weekly - 1 sets - 2-3 reps - elevate 10-15 min hold - Supine Active Straight Leg Raise (Mirrored)  - 2-3 x daily - 7 x weekly - 2 sets - 10 reps - 5 seconds hold - supine quad set with towel roll under ankle  - 2-3 x daily - 7 x weekly - 2 sets - 10 reps - 5 seconds hold - Supine Heel Slide with Strap  - 2-3 x daily - 7 x weekly - 2 sets - 10 reps - 5 seconds hold - seated single straight leg raise   - 2-3 x daily - 7 x weekly - 2 sets - 10 reps - 5 seconds hold - Seated Quad Set  - 2-3 x daily - 7 x weekly - 2 sets - 10 reps - 5 seconds hold - Seated Knee Flexion Extension AROM   - 2-3 x daily - 7 x weekly - 2 sets - 10 reps - 5 seconds hold - Prone Hip Extension  - 1 x daily - 7 x weekly - 2 sets - 10 reps - 3 seconds hold  ASSESSMENT:  CLINICAL IMPRESSION Pt has met all goals except hamstring strength on Lt is still not quite to goal but has demonstrated steady progress towards this goal.  She is ready for d/c and will continue to work on strengthening at home to maximize function.   OBJECTIVE IMPAIRMENTS: Abnormal gait, decreased activity tolerance, decreased balance, decreased coordination, decreased endurance, decreased knowledge of condition, decreased knowledge of use of DME, decreased mobility, difficulty walking, decreased ROM, decreased strength, increased edema, increased muscle spasms, and pain.   ACTIVITY LIMITATIONS: carrying, lifting, bending, sitting, standing, squatting, stairs, and locomotion level  PARTICIPATION LIMITATIONS: meal prep, cleaning, laundry, driving, shopping, community activity, and occupation  PERSONAL FACTORS: Profession, Time since onset of injury/illness/exacerbation, and 1-2 comorbidities: see above are also affecting patient's functional outcome.   REHAB POTENTIAL: Good  CLINICAL DECISION MAKING:  Stable/uncomplicated  EVALUATION COMPLEXITY: Low   GOALS: Goals reviewed with patient? Yes  SHORT TERM GOALS: (target date for Short term goals are 3 weeks 10/13/2023)   1.  Patient will demonstrate independent use of home exercise program to maintain progress from in clinic treatments. Goal status: MET 10/26/23  2.  Patient will demonstrate AROM 0-95deg  Goal status: MET 10/26/23   LONG TERM GOALS: (target date: 04/18/2024)   1. Patient will demonstrate/report pain at worst less than or equal to 2/10 to facilitate minimal limitation in daily activity secondary to pain symptoms.  Goal status: Met 11/09/2023   2. Patient will demonstrate independent use of home exercise program to facilitate ability to maintain/progress functional gains from skilled physical therapy services. Goal status: MET 04/18/24   3. Patient will demonstrate Patient specific functional scale avg > or = 7 to indicate reduced disability due to condition.  Goal status:: MET 04/18/24  4.  Patient will demonstrate LLE MMT 5/5 throughout to faciltiate usual transfers, stairs, squatting at [facility] for daily life.  Goal status: MET 01/25/24   5.  Patient will demonstrate/report ability to perform 18 inch chair transfer without UE assist.  Goal status: MET 11/09/2023   6.  Patient will demonstrate up and down a flight of stairs with single hand rail with reciprocal gait pattern.  Goal status: MET 04/18/24   7.  patient will demonstrate AROM 0-120 for full range of motion necessary to perform job tasks Goal Status: MET 11/09/23  8.  Improve Y balance test to > 90% composite score bil in order to work towards return to [employer]. Goal Status: MET 04/18/24  9. LLE quad and hamstring strength improved to at least 93% of RLE for improved strength and participate in sport specific activities Goal Status: PARTIALLY MET (MET EXTENSION) 04/18/24     PLAN:  PT FREQUENCY:  Every other week  PT DURATION: 12 weeks  PLANNED  INTERVENTIONS: Can include 02853- PT Re-evaluation, 97110-Therapeutic exercises, 97530- Therapeutic activity, 97112- Neuromuscular re-education, 97535- Self Care, 97140- Manual therapy, [phone]- Gait training, [phone]- Orthotic Fit/training, [phone]- Canalith repositioning, [id number]- Aquatic Therapy, [phone]- Electrical stimulation (unattended), [phone]- Electrical stimulation (manual), [id number] Physical performance testing, 97016- Vasopneumatic device, [id number]- Ultrasound, [id number]- Traction (mechanical), [id number]- Ionotophoresis 4mg /ml Dexamethasone , Blood Flow Restriction Therapy,  Patient/Family education, Balance training, Stair training, Taping, Dry Needling, Joint mobilization, Joint manipulation, Spinal manipulation, Spinal mobilization, Scar mobilization, Vestibular training, Visual/preceptual remediation/compensation, DME instructions, Cryotherapy, and Moist heat.  All performed as medically necessary.  All included unless contraindicated  PLAN FOR NEXT SESSION: d/c PT today   Next MD visit: 05/16/24    Corean JULIANNA Ku, PT, DPT 04/18/2024, 8:43 AM    PHYSICAL THERAPY DISCHARGE SUMMARY  Visits from Start of Care: 26  Current functional level related to goals / functional outcomes: See above   Remaining deficits: See above   Education / Equipment: HEP   Patient agrees to discharge. Patient goals were met. Patient is being discharged due to meeting the stated rehab goals.   Corean JULIANNA Ku, PT, DPT 04/18/24 8:43 AM

## 2024-04-30 ENCOUNTER — Encounter: Payer: Self-pay | Admitting: Radiology

## 2024-05-16 ENCOUNTER — Encounter: Payer: Self-pay | Admitting: Orthopedic Surgery

## 2024-05-16 ENCOUNTER — Ambulatory Visit: Admitting: Orthopedic Surgery

## 2024-05-16 DIAGNOSIS — Z9889 Other specified postprocedural states: Secondary | ICD-10-CM

## 2024-05-16 NOTE — Progress Notes (Unsigned)
 Post-Op Visit Note   Patient: Jasmine Ortega           Date of Birth: August 28, 1980           MRN: [id number] Visit Date: 05/16/2024 PCP: Joyce Norleen BROCKS, MD   Assessment & Plan:  Chief Complaint:  Chief Complaint  Patient presents with   Left Knee - Routine Post Op      09/13/2023  left knee ACL reconstruction with bone patella tendon bone allograft with the MAC and medial meniscal debridement     Visit Diagnoses: No diagnosis found.  Plan: Patient is now 8 months out left knee ACL reconstruction with bone patella tendon bone allograft.  Doing physical therapy exercises.  Still having a little bit of difficulty going down the stairs.  On examination she has excellent range of motion.  No effusion in the left knee.  Excellent graft stability with 1/2 mm Lachman with solid endpoint.  No crepitus.  Plan is to continue to work on quad and hamstring strengthening.  I think that will be protective for her as she resumes martial arts type activities.  She is not planning on skiing this season.  Overall she is done excellent job of rehabilitating the knee.  She will follow-up with us  as needed.  Follow-Up Instructions: No follow-ups on file.   Orders:  No orders of the defined types were placed in this encounter.  No orders of the defined types were placed in this encounter.   Imaging: No results found.  PMFS History: Patient Active Problem List   Diagnosis Date Noted   Left anterior cruciate ligament tear 09/18/2023   Acute medial meniscus tear of left knee 09/18/2023   Pain in left knee 08/03/2023   Chronic low back pain without sciatica 09/23/2021   Dysthymia 09/23/2021   Obesity (BMI 30-39.9) 09/14/2019   Hyperlipidemia 09/14/2019   Gastroesophageal reflux disease 04/27/2017   Attention deficit hyperactivity disorder (ADHD), predominantly inattentive type 03/05/2015   Loss of transverse plantar arch of right foot 09/18/2014   Past Medical History:  Diagnosis Date   Birth  control    Chicken pox    Depression    GERD (gastroesophageal reflux disease)    Gestational diabetes     Family History  Problem Relation Age of Onset   Breast cancer Mother    Cancer Mother 36       Breast cancer   Neuropathy Father    Arthritis Father    Hyperlipidemia Father    Hypertension Father    Stroke Father    Migraines Sister    Mitral valve prolapse Sister    Stroke Maternal Grandmother    Miscarriages / Stillbirths Maternal Grandfather    Arthritis Paternal Grandmother    Heart disease Paternal Grandmother    Diabetes Paternal Grandfather     Past Surgical History:  Procedure Laterality Date   ALLOGRAFT APPLICATION Left 09/13/2023   Procedure: BONE -PATELLA-TENDON-BONE ALLOGRAFT;  Surgeon: Addie Cordella Hamilton, MD;  Location: [facility] OR;  Service: Orthopedics;  Laterality: Left;   ANTERIOR CRUCIATE LIGAMENT REPAIR Left 09/13/2023   Procedure: LEFT KNEE ACL TEAR RECONSTRUCTION;  Surgeon: Addie Cordella Hamilton, MD;  Location: [facility] OR;  Service: Orthopedics;  Laterality: Left;   BONE MARROW ASPIRATION FOR SPINE FUSION ONLY (BMAC) Left 09/13/2023   Procedure: BONE MARROW ASPIRATE CONCENTRATE FROM ILLIAC CREST;  Surgeon: Addie Cordella Hamilton, MD;  Location: [facility] OR;  Service: Orthopedics;  Laterality: Left;   KNEE ARTHROSCOPY WITH MENISCAL REPAIR Left  09/13/2023   Procedure: MEDIAL MENISCAL TEAR REPAIR;  Surgeon: Addie Cordella Hamilton, MD;  Location: [facility] OR;  Service: Orthopedics;  Laterality: Left;   LASIK Bilateral    NO PAST SURGERIES     Social History   Occupational History   Not on file  Tobacco Use   Smoking status: Never   Smokeless tobacco: Never  Vaping Use   Vaping status: Never Used  Substance and Sexual Activity   Alcohol use: Yes    Alcohol/week: 2.0 standard drinks of alcohol    Types: 2 Glasses of wine per week    Comment: occas   Drug use: No   Sexual activity: Yes

## 2024-05-28 ENCOUNTER — Other Ambulatory Visit: Payer: Self-pay

## 2024-05-28 ENCOUNTER — Encounter: Payer: Self-pay | Admitting: Family Medicine

## 2024-05-28 DIAGNOSIS — E669 Obesity, unspecified: Secondary | ICD-10-CM

## 2024-05-28 DIAGNOSIS — E782 Mixed hyperlipidemia: Secondary | ICD-10-CM

## 2024-05-28 DIAGNOSIS — Z0000 Encounter for general adult medical examination without abnormal findings: Secondary | ICD-10-CM

## 2024-05-28 DIAGNOSIS — Z8639 Personal history of other endocrine, nutritional and metabolic disease: Secondary | ICD-10-CM

## 2024-05-28 NOTE — Telephone Encounter (Signed)
 Got it done and sent to patient, advised patient.

## 2024-05-29 ENCOUNTER — Other Ambulatory Visit (HOSPITAL_COMMUNITY): Payer: Self-pay

## 2024-05-31 LAB — COMPREHENSIVE METABOLIC PANEL WITH GFR
AG Ratio: 1.7 (calc) (ref 1.0–2.5)
ALT: 15 U/L (ref 6–29)
AST: 11 U/L (ref 10–30)
Albumin: 4.3 g/dL (ref 3.6–5.1)
Alkaline phosphatase (APISO): 53 U/L (ref 31–125)
BUN: 17 mg/dL (ref 7–25)
CO2: 23 mmol/L (ref 20–32)
Calcium: 9.1 mg/dL (ref 8.6–10.2)
Chloride: 103 mmol/L (ref 98–110)
Creat: 0.72 mg/dL (ref 0.50–0.99)
Globulin: 2.5 g/dL (ref 1.9–3.7)
Glucose, Bld: 101 mg/dL — ABNORMAL HIGH (ref 65–99)
Potassium: 4.6 mmol/L (ref 3.5–5.3)
Sodium: 137 mmol/L (ref 135–146)
Total Bilirubin: 0.6 mg/dL (ref 0.2–1.2)
Total Protein: 6.8 g/dL (ref 6.1–8.1)
eGFR: 106 mL/min/1.73m2 (ref >=60)

## 2024-05-31 LAB — CBC WITH DIFFERENTIAL/PLATELET
Absolute Lymphocytes: 2367 {cells}/uL (ref 850–3900)
Absolute Monocytes: 648 {cells}/uL (ref 200–950)
Basophils Absolute: 72 {cells}/uL (ref 0–200)
Basophils Relative: 0.8 %
Eosinophils Absolute: 180 {cells}/uL (ref 15–500)
Eosinophils Relative: 2 %
HCT: 40.9 % (ref 35.9–46.0)
Hemoglobin: 13.4 g/dL (ref 11.7–15.5)
MCH: 28.2 pg (ref 27.0–33.0)
MCHC: 32.8 g/dL (ref 31.6–35.4)
MCV: 85.9 fL (ref 81.4–101.7)
MPV: 11.2 fL (ref 7.5–12.5)
Monocytes Relative: 7.2 %
Neutro Abs: 5733 {cells}/uL (ref 1500–7800)
Neutrophils Relative %: 63.7 %
Platelets: 295 Thousand/uL (ref 140–400)
RBC: 4.76 Million/uL (ref 3.80–5.10)
RDW: 12.7 % (ref 11.0–15.0)
Total Lymphocyte: 26.3 %
WBC: 9 Thousand/uL (ref 3.8–10.8)

## 2024-05-31 LAB — LIPID PANEL
Cholesterol: 221 mg/dL — ABNORMAL HIGH (ref <200)
HDL: 62 mg/dL (ref >=50)
LDL Cholesterol (Calc): 132 mg/dL — ABNORMAL HIGH
Non-HDL Cholesterol (Calc): 159 mg/dL — ABNORMAL HIGH (ref <130)
Total CHOL/HDL Ratio: 3.6 (calc) (ref <5.0)
Triglycerides: 154 mg/dL — ABNORMAL HIGH (ref <150)

## 2024-05-31 LAB — HEMOGLOBIN A1C
Hgb A1c MFr Bld: 5.6 % (ref <5.7)
Mean Plasma Glucose: 114 mg/dL
eAG (mmol/L): 6.3 mmol/L

## 2024-05-31 LAB — T4, FREE: Free T4: 1 ng/dL (ref 0.8–1.8)

## 2024-05-31 LAB — TSH: TSH: 1.97 m[IU]/L

## 2024-06-13 ENCOUNTER — Ambulatory Visit
Admission: RE | Admit: 2024-06-13 | Discharge: 2024-06-13 | Disposition: A | Discharge status: Home / Self Care | Source: Ambulatory Visit | Attending: Family Medicine | Admitting: Family Medicine

## 2024-06-13 ENCOUNTER — Ambulatory Visit: Payer: Commercial Managed Care - PPO | Admitting: Family Medicine

## 2024-06-13 ENCOUNTER — Other Ambulatory Visit (HOSPITAL_COMMUNITY): Payer: Self-pay

## 2024-06-13 VITALS — BP 124/82 | HR 76 | Ht 64.5 in | Wt 242.6 lb

## 2024-06-13 DIAGNOSIS — M542 Cervicalgia: Secondary | ICD-10-CM | POA: Diagnosis not present

## 2024-06-13 DIAGNOSIS — R002 Palpitations: Secondary | ICD-10-CM | POA: Diagnosis not present

## 2024-06-13 DIAGNOSIS — F900 Attention-deficit hyperactivity disorder, predominantly inattentive type: Secondary | ICD-10-CM | POA: Diagnosis not present

## 2024-06-13 DIAGNOSIS — E785 Hyperlipidemia, unspecified: Secondary | ICD-10-CM

## 2024-06-13 DIAGNOSIS — Z0000 Encounter for general adult medical examination without abnormal findings: Secondary | ICD-10-CM | POA: Diagnosis not present

## 2024-06-13 DIAGNOSIS — M47816 Spondylosis without myelopathy or radiculopathy, lumbar region: Secondary | ICD-10-CM | POA: Diagnosis not present

## 2024-06-13 MED ORDER — AMPHETAMINE-DEXTROAMPHET ER 15 MG PO CP24
15.0000 mg | ORAL_CAPSULE | ORAL | 0 refills | Status: AC
  Filled 2024-06-13: qty 90, 90d supply, fill #0

## 2024-06-13 MED ORDER — ROSUVASTATIN CALCIUM 10 MG PO TABS
10.0000 mg | ORAL_TABLET | Freq: Every day | ORAL | 3 refills | Status: AC
  Filled 2024-06-13: qty 90, 90d supply, fill #0

## 2024-06-13 MED ORDER — AMPHETAMINE-DEXTROAMPHET ER 5 MG PO CP24
5.0000 mg | ORAL_CAPSULE | Freq: Every day | ORAL | 0 refills | Status: AC
  Filled 2024-06-13: qty 90, 90d supply, fill #0

## 2024-06-13 NOTE — Progress Notes (Signed)
 Name: [name]   Date of Visit: 06/13/2024   Date of last visit with me: Visit date not found   CHIEF COMPLAINT:  Chief Complaint  Patient presents with   Annual Exam    CPE, already had blood work,        HPI:  Discussed the use of AI scribe software for clinical note transcription with the patient, who gave verbal consent to proceed.  History of Present Illness   Jasmine Ortega is a 43 year old female who presents for a routine follow-up and discussion of her cholesterol levels.  Cholesterol levels have been gradually increasing despite maintaining weight and diet. She has a family history of high cholesterol, which she believes may contribute to her condition. Her HDL is low at 62, and she has not been on statin therapy before.  She is concerned about her fasting glucose level, which was 101 mg/dL. She was previously diagnosed as prediabetic but has since returned to normal A1c levels without significant weight change.  She experiences palpitations, described as a 'tickle' that sometimes causes her to cough. These sensations can feel like a 'water rippling' effect. No dizziness, nausea, changes in vision, or headaches are associated with these episodes. She also reports shoulder and jaw pain, typically on days when she is on call, which she attributes to muscle cramps and stress.  She has chronic low back pain, which she suspects is related to pelvic floor muscle issues, especially after having twins at age 40. She has not had any imaging studies for her back pain.  Her family history includes her father having primary myelofibrosis, hemochromatosis, type 2 diabetes, and nonalcoholic liver cirrhosis. Her mother had inflammatory breast cancer and passed away at age 71. Both of her sons are on the autism spectrum, with one diagnosed with KGB syndrome.  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 She  tries to walk more and use an elliptical at home but finds it challenging due to her responsibilities.         OBJECTIVE:       06/13/2024    1:48 PM  Depression screen PHQ 2/9  Decreased Interest 0  Down, Depressed, Hopeless 0  PHQ - 2 Score 0     BP Readings from Last 3 Encounters:  06/13/24 124/82  09/13/23 113/78  09/07/23 123/75    BP 124/82   Pulse 76   Ht 5' 4.5 (1.638 m)   Wt 242 lb 9.6 oz (110 kg)   BMI 41.00 kg/m    Physical Exam          Physical Exam Constitutional:      Appearance: Normal appearance.  Neurological:     General: No focal deficit present.     Mental Status: She is alert and oriented to person, place, and time. Mental status is at baseline.     ASSESSMENT/PLAN:   Assessment & Plan Annual physical exam  Hyperlipidemia, unspecified hyperlipidemia type  Cervical pain  Attention deficit hyperactivity disorder (ADHD), predominantly inattentive type  Palpitations    Assessment and Plan    Hyperlipidemia Elevated cholesterol with low HDL, likely genetic. Statin therapy indicated for cardiovascular risk reduction. Crestor  selected for efficacy and safety. - Started Crestor  10 mg daily. - Repeat cholesterol levels in one year.  Palpitations Intermittent palpitations, likely stress or caffeine-related. Cardiac causes need exclusion. - Ordered echocardiogram.  Cervicalgia Frequent neck spasms, possible cervical  degeneration. - Ordered neck x-ray.  Low back pain Chronic pain, possible pelvic floor muscle involvement. - Ordered lower back x-ray.  Attention-deficit hyperactivity disorder, predominantly inattentive type ADHD managed with Adderall , stable dosage. - Continue current Adderall  regimen.  General Health Maintenance Mammogram and flu vaccination current. Advised increased physical activity. - Encouraged use of elliptical to increase daily steps. - Recommended colonoscopy for screening.  -Comprehensive annual  physical exam completed today. Reviewed interval history, current medical issues, medications, allergies, and preventive care needs. Addressed all patient questions and concerns. Discussed lifestyle factors including diet, exercise, sleep, and stress management. Reviewed recommended age-appropriate screenings, labs, and vaccinations. Counseling provided on healthy habits and routine health maintenance. Follow-up as indicated based on findings and results.         Mohsin Crum A. Vita MD [facility]

## 2024-06-13 NOTE — Patient Instructions (Signed)
 Please go get your xrays done at [facility]. You do not need to make an appointment. You can just show up.   Address: [address] [place], [place], [place] [address]

## 2024-06-27 ENCOUNTER — Other Ambulatory Visit: Payer: Self-pay | Admitting: Family Medicine

## 2024-06-27 ENCOUNTER — Other Ambulatory Visit: Payer: Self-pay

## 2024-06-27 ENCOUNTER — Other Ambulatory Visit (HOSPITAL_COMMUNITY): Payer: Self-pay

## 2024-06-27 DIAGNOSIS — F341 Dysthymic disorder: Secondary | ICD-10-CM

## 2024-06-27 MED ORDER — ESCITALOPRAM OXALATE 20 MG PO TABS
20.0000 mg | ORAL_TABLET | Freq: Every day | ORAL | 0 refills | Status: AC
  Filled 2024-06-27: qty 90, 90d supply, fill #0

## 2024-06-27 NOTE — Telephone Encounter (Signed)
 Last appt. 06/13/24.

## 2024-07-03 ENCOUNTER — Other Ambulatory Visit (HOSPITAL_COMMUNITY): Payer: Self-pay

## 2024-07-03 MED ORDER — TINIDAZOLE 500 MG PO TABS
2000.0000 mg | ORAL_TABLET | Freq: Every day | ORAL | 1 refills | Status: AC
  Filled 2024-07-03: qty 12, 3d supply, fill #0

## 2024-07-23 ENCOUNTER — Other Ambulatory Visit (HOSPITAL_COMMUNITY)

## 2024-07-25 ENCOUNTER — Ambulatory Visit (HOSPITAL_COMMUNITY)
Admission: RE | Admit: 2024-07-25 | Discharge: 2024-07-25 | Disposition: A | Discharge status: Home / Self Care | Source: Ambulatory Visit | Attending: Family Medicine | Admitting: Family Medicine

## 2024-07-25 DIAGNOSIS — R079 Chest pain, unspecified: Secondary | ICD-10-CM | POA: Insufficient documentation

## 2024-07-25 DIAGNOSIS — R002 Palpitations: Secondary | ICD-10-CM

## 2024-07-25 DIAGNOSIS — E785 Hyperlipidemia, unspecified: Secondary | ICD-10-CM | POA: Insufficient documentation

## 2024-07-25 LAB — ECHOCARDIOGRAM COMPLETE
AR max vel: 2.19 cm2
AV Area VTI: 2.35 cm2
AV Area mean vel: 2.14 cm2
AV Mean grad: 4 mmHg
AV Peak grad: 7.2 mmHg
Ao pk vel: 1.34 m/s
Area-P 1/2: 3.12 cm2
Calc EF: 61.2 %
MV VTI: 3.56 cm2
S' Lateral: 3 cm
Single Plane A2C EF: 58.9 %
Single Plane A4C EF: 65.4 %

## 2024-07-26 ENCOUNTER — Ambulatory Visit: Payer: Self-pay | Admitting: Family Medicine

## 2024-08-08 ENCOUNTER — Ambulatory Visit: Payer: Commercial Managed Care - PPO | Admitting: Dermatology

## 2025-06-19 ENCOUNTER — Encounter: Admitting: Family Medicine
